# Patient Record
Sex: Female | Born: 1965 | Race: White | Hispanic: No | Smoking: Never smoker
Health system: Southern US, Community
[De-identification: ages and names within clinical notes are randomized; demographics above are authoritative.]

## PROBLEM LIST (undated history)

## (undated) DIAGNOSIS — I639 Cerebral infarction, unspecified: Secondary | ICD-10-CM

## (undated) DIAGNOSIS — J449 Chronic obstructive pulmonary disease, unspecified: Secondary | ICD-10-CM

## (undated) DIAGNOSIS — I2510 Atherosclerotic heart disease of native coronary artery without angina pectoris: Secondary | ICD-10-CM

## (undated) DIAGNOSIS — R519 Headache, unspecified: Secondary | ICD-10-CM

## (undated) DIAGNOSIS — M797 Fibromyalgia: Secondary | ICD-10-CM

## (undated) DIAGNOSIS — N289 Disorder of kidney and ureter, unspecified: Secondary | ICD-10-CM

## (undated) DIAGNOSIS — E119 Type 2 diabetes mellitus without complications: Secondary | ICD-10-CM

## (undated) DIAGNOSIS — T300 Burn of unspecified body region, unspecified degree: Secondary | ICD-10-CM

## (undated) DIAGNOSIS — H5440 Blindness, one eye, unspecified eye: Secondary | ICD-10-CM

## (undated) DIAGNOSIS — R51 Headache: Secondary | ICD-10-CM

## (undated) DIAGNOSIS — E7800 Pure hypercholesterolemia, unspecified: Secondary | ICD-10-CM

## (undated) HISTORY — DX: Pure hypercholesterolemia, unspecified: E78.00

## (undated) HISTORY — PX: WRIST SURGERY: SHX841

## (undated) HISTORY — DX: Fibromyalgia: M79.7

## (undated) HISTORY — PX: FOOT SURGERY: SHX648

## (undated) HISTORY — DX: Cerebral infarction, unspecified: I63.9

## (undated) HISTORY — DX: Headache: R51

## (undated) HISTORY — DX: Headache, unspecified: R51.9

## (undated) HISTORY — PX: KNEE SURGERY: SHX244

---

## 1998-01-24 HISTORY — PX: ABDOMINAL HYSTERECTOMY: SHX81

## 2005-08-15 ENCOUNTER — Emergency Department (HOSPITAL_COMMUNITY): Admission: EM | Admit: 2005-08-15 | Discharge: 2005-08-15 | Payer: Self-pay | Admitting: Emergency Medicine

## 2006-03-24 ENCOUNTER — Encounter: Admission: RE | Admit: 2006-03-24 | Discharge: 2006-03-24 | Payer: Self-pay | Admitting: Orthopedic Surgery

## 2006-03-28 ENCOUNTER — Emergency Department (HOSPITAL_COMMUNITY): Admission: EM | Admit: 2006-03-28 | Discharge: 2006-03-28 | Payer: Self-pay | Admitting: Family Medicine

## 2006-07-05 ENCOUNTER — Emergency Department (HOSPITAL_COMMUNITY): Admission: EM | Admit: 2006-07-05 | Discharge: 2006-07-05 | Payer: Self-pay | Admitting: Emergency Medicine

## 2006-08-27 ENCOUNTER — Emergency Department (HOSPITAL_COMMUNITY): Admission: EM | Admit: 2006-08-27 | Discharge: 2006-08-27 | Payer: Self-pay | Admitting: Emergency Medicine

## 2006-10-08 ENCOUNTER — Encounter: Admission: RE | Admit: 2006-10-08 | Discharge: 2006-10-08 | Payer: Self-pay | Admitting: Orthopedic Surgery

## 2006-11-15 ENCOUNTER — Emergency Department (HOSPITAL_COMMUNITY): Admission: EM | Admit: 2006-11-15 | Discharge: 2006-11-15 | Payer: Self-pay | Admitting: Emergency Medicine

## 2007-04-02 ENCOUNTER — Encounter: Admission: RE | Admit: 2007-04-02 | Discharge: 2007-04-02 | Payer: Self-pay | Admitting: Internal Medicine

## 2007-04-10 ENCOUNTER — Encounter: Admission: RE | Admit: 2007-04-10 | Discharge: 2007-04-10 | Payer: Self-pay | Admitting: Internal Medicine

## 2007-04-16 ENCOUNTER — Emergency Department (HOSPITAL_COMMUNITY): Admission: EM | Admit: 2007-04-16 | Discharge: 2007-04-16 | Payer: Self-pay | Admitting: Emergency Medicine

## 2010-10-18 LAB — POCT I-STAT, CHEM 8
BUN: 12
Chloride: 106
Creatinine, Ser: 1.3 — ABNORMAL HIGH
Hemoglobin: 15.3 — ABNORMAL HIGH
Sodium: 137
TCO2: 26

## 2010-10-18 LAB — POCT CARDIAC MARKERS
CKMB, poc: <1 — ABNORMAL LOW
Myoglobin, poc: 29
Troponin i, poc: <0.05

## 2010-11-11 LAB — LIPASE, BLOOD: Lipase: 34

## 2010-11-11 LAB — POCT CARDIAC MARKERS
Myoglobin, poc: 50.6
Operator id: 189501
Troponin i, poc: <0.05

## 2010-11-11 LAB — COMPREHENSIVE METABOLIC PANEL
AST: 18
Alkaline Phosphatase: 54
Calcium: 8.9
Chloride: 105
Creatinine, Ser: 0.79
Glucose, Bld: 104 — ABNORMAL HIGH
Sodium: 136

## 2010-11-11 LAB — DIFFERENTIAL
Basophils Relative: 1
Eosinophils Absolute: 0.1
Lymphocytes Relative: 26

## 2010-11-11 LAB — CBC
HCT: 42.1
RBC: 4.99
RDW: 13.3
WBC: 9.4

## 2010-11-11 LAB — CK TOTAL AND CKMB (NOT AT ARMC)
CK, MB: 1.6
Total CK: 84

## 2010-11-11 LAB — URINALYSIS, ROUTINE W REFLEX MICROSCOPIC
Ketones, ur: NEGATIVE
Urobilinogen, UA: 0.2

## 2012-01-25 DIAGNOSIS — I639 Cerebral infarction, unspecified: Secondary | ICD-10-CM

## 2012-01-25 HISTORY — DX: Cerebral infarction, unspecified: I63.9

## 2014-01-24 HISTORY — PX: CHOLECYSTECTOMY: SHX55

## 2014-07-06 ENCOUNTER — Emergency Department (HOSPITAL_COMMUNITY): Payer: 59

## 2014-07-06 ENCOUNTER — Encounter (HOSPITAL_COMMUNITY): Payer: Self-pay

## 2014-07-06 ENCOUNTER — Emergency Department (HOSPITAL_COMMUNITY)
Admission: EM | Admit: 2014-07-06 | Discharge: 2014-07-06 | Disposition: A | Discharge status: Home / Self Care | Payer: 59 | Attending: Emergency Medicine | Admitting: Emergency Medicine

## 2014-07-06 DIAGNOSIS — I10 Essential (primary) hypertension: Secondary | ICD-10-CM | POA: Diagnosis not present

## 2014-07-06 DIAGNOSIS — M6281 Muscle weakness (generalized): Secondary | ICD-10-CM | POA: Insufficient documentation

## 2014-07-06 DIAGNOSIS — E860 Dehydration: Secondary | ICD-10-CM

## 2014-07-06 DIAGNOSIS — D849 Immunodeficiency, unspecified: Secondary | ICD-10-CM | POA: Insufficient documentation

## 2014-07-06 DIAGNOSIS — E1165 Type 2 diabetes mellitus with hyperglycemia: Secondary | ICD-10-CM | POA: Insufficient documentation

## 2014-07-06 DIAGNOSIS — R112 Nausea with vomiting, unspecified: Secondary | ICD-10-CM

## 2014-07-06 DIAGNOSIS — R739 Hyperglycemia, unspecified: Secondary | ICD-10-CM

## 2014-07-06 DIAGNOSIS — Z973 Presence of spectacles and contact lenses: Secondary | ICD-10-CM | POA: Diagnosis not present

## 2014-07-06 DIAGNOSIS — R079 Chest pain, unspecified: Secondary | ICD-10-CM | POA: Insufficient documentation

## 2014-07-06 DIAGNOSIS — H5442 Blindness, left eye, normal vision right eye: Secondary | ICD-10-CM | POA: Insufficient documentation

## 2014-07-06 DIAGNOSIS — R1013 Epigastric pain: Secondary | ICD-10-CM

## 2014-07-06 DIAGNOSIS — R42 Dizziness and giddiness: Secondary | ICD-10-CM

## 2014-07-06 DIAGNOSIS — Z87828 Personal history of other (healed) physical injury and trauma: Secondary | ICD-10-CM | POA: Insufficient documentation

## 2014-07-06 DIAGNOSIS — K2970 Gastritis, unspecified, without bleeding: Secondary | ICD-10-CM | POA: Diagnosis not present

## 2014-07-06 DIAGNOSIS — R51 Headache: Secondary | ICD-10-CM | POA: Diagnosis not present

## 2014-07-06 DIAGNOSIS — R519 Headache, unspecified: Secondary | ICD-10-CM

## 2014-07-06 HISTORY — DX: Blindness, one eye, unspecified eye: H54.40

## 2014-07-06 HISTORY — DX: Burn of unspecified body region, unspecified degree: T30.0

## 2014-07-06 HISTORY — DX: Type 2 diabetes mellitus without complications: E11.9

## 2014-07-06 LAB — CBC WITH DIFFERENTIAL/PLATELET
BASOS PCT: 1 % (ref 0–1)
Basophils Absolute: 0.1 10*3/uL (ref 0.0–0.1)
EOS ABS: 0.1 10*3/uL (ref 0.0–0.7)
Eosinophils Relative: 1 % (ref 0–5)
HCT: 46 % (ref 36.0–46.0)
Hemoglobin: 15.2 g/dL — ABNORMAL HIGH (ref 12.0–15.0)
LYMPHS PCT: 23 % (ref 12–46)
Lymphs Abs: 2.5 10*3/uL (ref 0.7–4.0)
MCH: 27.7 pg (ref 26.0–34.0)
MCHC: 33 g/dL (ref 30.0–36.0)
MCV: 83.9 fL (ref 78.0–100.0)
Monocytes Absolute: 0.7 10*3/uL (ref 0.1–1.0)
Monocytes Relative: 7 % (ref 3–12)
NEUTROS ABS: 7.5 10*3/uL (ref 1.7–7.7)
Neutrophils Relative %: 68 % (ref 43–77)
PLATELETS: 261 10*3/uL (ref 150–400)
RBC: 5.48 MIL/uL — ABNORMAL HIGH (ref 3.87–5.11)
RDW: 14.4 % (ref 11.5–15.5)
WBC: 10.8 10*3/uL — AB (ref 4.0–10.5)

## 2014-07-06 LAB — URINE MICROSCOPIC-ADD ON

## 2014-07-06 LAB — COMPREHENSIVE METABOLIC PANEL
ALBUMIN: 3.9 g/dL (ref 3.5–5.0)
ALT: 36 U/L (ref 14–54)
AST: 40 U/L (ref 15–41)
Alkaline Phosphatase: 99 U/L (ref 38–126)
Anion gap: 11 (ref 5–15)
BUN: 12 mg/dL (ref 6–20)
CALCIUM: 9.6 mg/dL (ref 8.9–10.3)
CO2: 26 mmol/L (ref 22–32)
Chloride: 98 mmol/L — ABNORMAL LOW (ref 101–111)
Creatinine, Ser: 0.96 mg/dL (ref 0.44–1.00)
GFR calc non Af Amer: >60 mL/min (ref >60)
Glucose, Bld: 285 mg/dL — ABNORMAL HIGH (ref 65–99)
POTASSIUM: 5.1 mmol/L (ref 3.5–5.1)
Sodium: 135 mmol/L (ref 135–145)
TOTAL PROTEIN: 7.2 g/dL (ref 6.5–8.1)
Total Bilirubin: 1.3 mg/dL — ABNORMAL HIGH (ref 0.3–1.2)

## 2014-07-06 LAB — I-STAT TROPONIN, ED: TROPONIN I, POC: 0.01 ng/mL (ref 0.00–0.08)

## 2014-07-06 LAB — LIPASE, BLOOD: LIPASE: 33 U/L (ref 22–51)

## 2014-07-06 LAB — URINALYSIS, ROUTINE W REFLEX MICROSCOPIC
Bilirubin Urine: NEGATIVE
Hgb urine dipstick: NEGATIVE
Ketones, ur: NEGATIVE mg/dL
NITRITE: NEGATIVE
Protein, ur: NEGATIVE mg/dL
Specific Gravity, Urine: 1.011 (ref 1.005–1.030)
UROBILINOGEN UA: 0.2 mg/dL (ref 0.0–1.0)
pH: 6 (ref 5.0–8.0)

## 2014-07-06 MED ORDER — ONDANSETRON 4 MG PO TBDP
8.0000 mg | ORAL_TABLET | Freq: Once | ORAL | Status: AC
  Administered 2014-07-06: 8 mg via ORAL

## 2014-07-06 MED ORDER — RANITIDINE HCL 150 MG PO TABS: 150.0000 mg | ORAL_TABLET | Freq: Two times a day (BID) | ORAL | Status: DC

## 2014-07-06 MED ORDER — MORPHINE SULFATE 4 MG/ML IJ SOLN
4.0000 mg | Freq: Once | INTRAMUSCULAR | Status: AC
  Administered 2014-07-06: 4 mg via INTRAVENOUS
  Filled 2014-07-06: qty 1

## 2014-07-06 MED ORDER — GI COCKTAIL ~~LOC~~
30.0000 mL | Freq: Once | ORAL | Status: AC
  Administered 2014-07-06: 30 mL via ORAL
  Filled 2014-07-06: qty 30

## 2014-07-06 MED ORDER — PANTOPRAZOLE SODIUM 40 MG IV SOLR
40.0000 mg | Freq: Once | INTRAVENOUS | Status: AC
  Administered 2014-07-06: 40 mg via INTRAVENOUS
  Filled 2014-07-06: qty 40

## 2014-07-06 MED ORDER — DIPHENHYDRAMINE HCL 50 MG/ML IJ SOLN
25.0000 mg | Freq: Once | INTRAMUSCULAR | Status: AC
  Administered 2014-07-06: 15:00:00 via INTRAVENOUS
  Filled 2014-07-06: qty 1

## 2014-07-06 MED ORDER — HYDROCODONE-ACETAMINOPHEN 5-325 MG PO TABS: 1.0000 | ORAL_TABLET | Freq: Four times a day (QID) | ORAL | Status: DC | PRN

## 2014-07-06 MED ORDER — METOCLOPRAMIDE HCL 5 MG/ML IJ SOLN
10.0000 mg | Freq: Once | INTRAMUSCULAR | Status: AC
  Administered 2014-07-06: 10 mg via INTRAVENOUS
  Filled 2014-07-06: qty 2

## 2014-07-06 MED ORDER — ONDANSETRON 4 MG PO TBDP
ORAL_TABLET | ORAL | Status: AC
  Filled 2014-07-06: qty 2

## 2014-07-06 MED ORDER — SODIUM CHLORIDE 0.9 % IV BOLUS (SEPSIS)
1000.0000 mL | Freq: Once | INTRAVENOUS | Status: AC
  Administered 2014-07-06: 1000 mL via INTRAVENOUS

## 2014-07-06 MED ORDER — METOCLOPRAMIDE HCL 10 MG PO TABS: 10.0000 mg | ORAL_TABLET | Freq: Four times a day (QID) | ORAL | Status: DC | PRN

## 2014-07-06 MED ORDER — GI COCKTAIL ~~LOC~~: 30.0000 mL | Freq: Once | ORAL | Status: AC
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
## 2014-07-06 NOTE — Discharge Instructions (Signed)
Your pain is likely related to gastritis or possibly a peptic ulcer. In addition to your usual home medications, take zantac as directed for two weeks. Avoid spicy or fatty/fried foods, avoid acidic foods. Don't lay down flat within 30 minutes of eating a meal. Stay well hydrated. Your headache was likely from dehydration and from throwing up. Use reglan as directed as needed for nausea or headaches. Use norco as directed as needed for pain, but don't drive while taking norco. Follow up with your regular doctor to recheck symptoms in one week. Follow up with gastroenterologist in 1-2 weeks for ongoing management of your abdominal pain. Return to the ER for changes or worsening symptoms.  Abdominal (belly) pain can be caused by many things. Your caregiver performed an examination and possibly ordered blood/urine tests and imaging (CT scan, x-rays, ultrasound). Many cases can be observed and treated at home after initial evaluation in the emergency department. Even though you are being discharged home, abdominal pain can be unpredictable. Therefore, you need a repeated exam if your pain does not resolve, returns, or worsens. Most patients with abdominal pain don't have to be admitted to the hospital or have surgery, but serious problems like appendicitis and gallbladder attacks can start out as nonspecific pain. Many abdominal conditions cannot be diagnosed in one visit, so follow-up evaluations are very important. SEEK IMMEDIATE MEDICAL ATTENTION IF YOU DEVELOP ANY OF THE FOLLOWING SYMPTOMS:  The pain does not go away or becomes severe.   A temperature above 101 develops.   Repeated vomiting occurs (multiple episodes).   The pain becomes localized to portions of the abdomen. The right side could possibly be appendicitis. In an adult, the left lower portion of the abdomen could be colitis or diverticulitis.   Blood is being passed in stools or vomit (bright red or black tarry stools).   Return also if  you develop chest pain, difficulty breathing, dizziness or fainting, or become confused, poorly responsive, or inconsolable (young children).  The constipation stays for more than 4 days.   There is belly (abdominal) or rectal pain.   You do not seem to be getting better.     Gastritis, Adult Gastritis is soreness and puffiness (inflammation) of the lining of the stomach. If you do not get help, gastritis can cause bleeding and sores (ulcers) in the stomach. HOME CARE   Only take medicine as told by your doctor.  If you were given antibiotic medicines, take them as told. Finish the medicines even if you start to feel better.  Drink enough fluids to keep your pee (urine) clear or pale yellow.  Avoid foods and drinks that make your problems worse. Foods you may want to avoid include:  Caffeine or alcohol.  Chocolate.  Mint.  Garlic and onions.  Spicy foods.  Citrus fruits, including oranges, lemons, or limes.  Food containing tomatoes, including sauce, chili, salsa, and pizza.  Fried and fatty foods.  Eat small meals throughout the day instead of large meals. GET HELP RIGHT AWAY IF:   You have black or dark red poop (stools).  You throw up (vomit) blood. It may look like coffee grounds.  You cannot keep fluids down.  Your belly (abdominal) pain gets worse.  You have a fever.  You do not feel better after 1 week.  You have any other questions or concerns. MAKE SURE YOU:   Understand these instructions.  Will watch your condition.  Will get help right away if you are not doing  well or get worse. Document Released: 06/29/2007 Document Revised: 04/04/2011 Document Reviewed: 02/23/2011 [facility] Patient Information 2015 [place], [place]. This information is not intended to replace advice given to you by your health care provider. Make sure you discuss any questions you have with your health care provider.  Migraine Headache A migraine headache is very bad,  throbbing pain on one or both sides of your head. Talk to your doctor about what things may bring on (trigger) your migraine headaches. HOME CARE  Only take medicines as told by your doctor.  Lie down in a dark, quiet room when you have a migraine.  Keep a journal to find out if certain things bring on migraine headaches. For example, write down:  What you eat and drink.  How much sleep you get.  Any change to your diet or medicines.  Lessen how much alcohol you drink.  Quit smoking if you smoke.  Get enough sleep.  Lessen any stress in your life.  Keep lights dim if bright lights bother you or make your migraines worse. GET HELP RIGHT AWAY IF:   Your migraine becomes really bad.  You have a fever.  You have a stiff neck.  You have trouble seeing.  Your muscles are weak, or you lose muscle control.  You lose your balance or have trouble walking.  You feel like you will pass out (faint), or you pass out.  You have really bad symptoms that are different than your first symptoms. MAKE SURE YOU:   Understand these instructions.  Will watch your condition.  Will get help right away if you are not doing well or get worse. Document Released: 10/20/2007 Document Revised: 04/04/2011 Document Reviewed: 09/17/2012 [facility] Patient Information 2015 [place], [place]. This information is not intended to replace advice given to you by your health care provider. Make sure you discuss any questions you have with your health care provider.  Nausea and Vomiting Nausea is a sick feeling that often comes before throwing up (vomiting). Vomiting is a reflex where stomach contents come out of your mouth. Vomiting can cause severe loss of body fluids (dehydration). Children and elderly adults can become dehydrated quickly, especially if they also have diarrhea. Nausea and vomiting are symptoms of a condition or disease. It is important to find the cause of your symptoms. CAUSES   Direct  irritation of the stomach lining. This irritation can result from increased acid production (gastroesophageal reflux disease), infection, food poisoning, taking certain medicines (such as nonsteroidal anti-inflammatory drugs), alcohol use, or tobacco use.  Signals from the brain.These signals could be caused by a headache, heat exposure, an inner ear disturbance, increased pressure in the brain from injury, infection, a tumor, or a concussion, pain, emotional stimulus, or metabolic problems.  An obstruction in the gastrointestinal tract (bowel obstruction).  Illnesses such as diabetes, hepatitis, gallbladder problems, appendicitis, kidney problems, cancer, sepsis, atypical symptoms of a heart attack, or eating disorders.  Medical treatments such as chemotherapy and radiation.  Receiving medicine that makes you sleep (general anesthetic) during surgery. DIAGNOSIS Your caregiver may ask for tests to be done if the problems do not improve after a few days. Tests may also be done if symptoms are severe or if the reason for the nausea and vomiting is not clear. Tests may include:  Urine tests.  Blood tests.  Stool tests.  Cultures (to look for evidence of infection).  X-rays or other imaging studies. Test results can help your caregiver make decisions about treatment or  the need for additional tests. TREATMENT You need to stay well hydrated. Drink frequently but in small amounts.You may wish to drink water, sports drinks, clear broth, or eat frozen ice pops or gelatin dessert to help stay hydrated.When you eat, eating slowly may help prevent nausea.There are also some antinausea medicines that may help prevent nausea. HOME CARE INSTRUCTIONS   Take all medicine as directed by your caregiver.  If you do not have an appetite, do not force yourself to eat. However, you must continue to drink fluids.  If you have an appetite, eat a normal diet unless your caregiver tells you  differently.  Eat a variety of complex carbohydrates (rice, wheat, potatoes, bread), lean meats, yogurt, fruits, and vegetables.  Avoid high-fat foods because they are more difficult to digest.  Drink enough water and fluids to keep your urine clear or pale yellow.  If you are dehydrated, ask your caregiver for specific rehydration instructions. Signs of dehydration may include:  Severe thirst.  Dry lips and mouth.  Dizziness.  Dark urine.  Decreasing urine frequency and amount.  Confusion.  Rapid breathing or pulse. SEEK IMMEDIATE MEDICAL CARE IF:   You have blood or brown flecks (like coffee grounds) in your vomit.  You have black or bloody stools.  You have a severe headache or stiff neck.  You are confused.  You have severe abdominal pain.  You have chest pain or trouble breathing.  You do not urinate at least once every 8 hours.  You develop cold or clammy skin.  You continue to vomit for longer than 24 to 48 hours.  You have a fever. MAKE SURE YOU:   Understand these instructions.  Will watch your condition.  Will get help right away if you are not doing well or get worse. Document Released: 01/10/2005 Document Revised: 04/04/2011 Document Reviewed: 06/09/2010 [facility] Patient Information 2015 [place], [place]. This information is not intended to replace advice given to you by your health care provider. Make sure you discuss any questions you have with your health care provider.  Dehydration, Adult Dehydration means your body does not have as much fluid as it needs. Your kidneys, brain, and heart will not work properly without the right amount of fluids and salt.  HOME CARE  Ask your doctor how to replace body fluid losses (rehydrate).  Drink enough fluids to keep your pee (urine) clear or pale yellow.  Drink small amounts of fluids often if you feel sick to your stomach (nauseous) or throw up (vomit).  Eat like you normally do.  Avoid:  Foods  or drinks high in sugar.  Bubbly (carbonated) drinks.  Juice.  Very hot or cold fluids.  Drinks with caffeine.  Fatty, greasy foods.  Alcohol.  Tobacco.  Eating too much.  Gelatin desserts.  Wash your hands to avoid spreading germs (bacteria, viruses).  Only take medicine as told by your doctor.  Keep all doctor visits as told. GET HELP RIGHT AWAY IF:   You cannot drink something without throwing up.  You get worse even with treatment.  Your vomit has blood in it or looks greenish.  Your poop (stool) has blood in it or looks black and tarry.  You have not peed in 6 to 8 hours.  You pee a small amount of very dark pee.  You have a fever.  You pass out (faint).  You have belly (abdominal) pain that gets worse or stays in one spot (localizes).  You have a rash, stiff  neck, or bad headache.  You get easily annoyed, sleepy, or are hard to wake up.  You feel weak, dizzy, or very thirsty. MAKE SURE YOU:   Understand these instructions.  Will watch your condition.  Will get help right away if you are not doing well or get worse. Document Released: 11/06/2008 Document Revised: 04/04/2011 Document Reviewed: 08/30/2010 [facility] Patient Information 2015 [place], [place]. This information is not intended to replace advice given to you by your health care provider. Make sure you discuss any questions you have with your health care provider.

## 2014-07-06 NOTE — ED Notes (Signed)
Pt reports onset last night vomiting x 8, last vomit PTA.  Onset 3 days constant upper abd pain.  Onset this morning headache.

## 2014-07-06 NOTE — ED Notes (Signed)
PA at bedside.

## 2014-09-05 ENCOUNTER — Encounter (HOSPITAL_COMMUNITY): Payer: Self-pay | Admitting: *Deleted

## 2014-09-05 ENCOUNTER — Emergency Department (HOSPITAL_COMMUNITY)
Admission: EM | Admit: 2014-09-05 | Discharge: 2014-09-05 | Disposition: A | Discharge status: Home / Self Care | Payer: 59 | Attending: Emergency Medicine | Admitting: Emergency Medicine

## 2014-09-05 ENCOUNTER — Emergency Department (HOSPITAL_COMMUNITY): Payer: 59

## 2014-09-05 DIAGNOSIS — Z87828 Personal history of other (healed) physical injury and trauma: Secondary | ICD-10-CM | POA: Insufficient documentation

## 2014-09-05 DIAGNOSIS — Z79899 Other long term (current) drug therapy: Secondary | ICD-10-CM | POA: Diagnosis not present

## 2014-09-05 DIAGNOSIS — G500 Trigeminal neuralgia: Secondary | ICD-10-CM | POA: Diagnosis not present

## 2014-09-05 DIAGNOSIS — H5442 Blindness, left eye, normal vision right eye: Secondary | ICD-10-CM | POA: Diagnosis not present

## 2014-09-05 DIAGNOSIS — R51 Headache: Secondary | ICD-10-CM | POA: Diagnosis present

## 2014-09-05 DIAGNOSIS — E119 Type 2 diabetes mellitus without complications: Secondary | ICD-10-CM | POA: Diagnosis not present

## 2014-09-05 DIAGNOSIS — Z792 Long term (current) use of antibiotics: Secondary | ICD-10-CM | POA: Insufficient documentation

## 2014-09-05 LAB — BASIC METABOLIC PANEL
Anion gap: 11 (ref 5–15)
BUN: 10 mg/dL (ref 6–20)
CO2: 24 mmol/L (ref 22–32)
Calcium: 9.5 mg/dL (ref 8.9–10.3)
Chloride: 104 mmol/L (ref 101–111)
Creatinine, Ser: 0.93 mg/dL (ref 0.44–1.00)
GFR calc Af Amer: >60 mL/min (ref >60)
GFR calc non Af Amer: >60 mL/min (ref >60)
GLUCOSE: 224 mg/dL — AB (ref 65–99)
POTASSIUM: 3.7 mmol/L (ref 3.5–5.1)
SODIUM: 139 mmol/L (ref 135–145)

## 2014-09-05 LAB — CBC WITH DIFFERENTIAL/PLATELET
BASOS ABS: 0.1 10*3/uL (ref 0.0–0.1)
BASOS PCT: 1 % (ref 0–1)
Eosinophils Absolute: 0.1 10*3/uL (ref 0.0–0.7)
Eosinophils Relative: 1 % (ref 0–5)
HEMATOCRIT: 42.2 % (ref 36.0–46.0)
HEMOGLOBIN: 13.7 g/dL (ref 12.0–15.0)
Lymphocytes Relative: 29 % (ref 12–46)
Lymphs Abs: 1.9 10*3/uL (ref 0.7–4.0)
MCH: 27.5 pg (ref 26.0–34.0)
MCHC: 32.5 g/dL (ref 30.0–36.0)
MCV: 84.7 fL (ref 78.0–100.0)
MONO ABS: 0.5 10*3/uL (ref 0.1–1.0)
MONOS PCT: 8 % (ref 3–12)
Neutro Abs: 4 10*3/uL (ref 1.7–7.7)
Neutrophils Relative %: 61 % (ref 43–77)
Platelets: 259 10*3/uL (ref 150–400)
RBC: 4.98 MIL/uL (ref 3.87–5.11)
RDW: 14.8 % (ref 11.5–15.5)
WBC: 6.5 10*3/uL (ref 4.0–10.5)

## 2014-09-05 MED ORDER — PHENYTOIN SODIUM EXTENDED 100 MG PO CAPS
100.0000 mg | ORAL_CAPSULE | Freq: Once | ORAL | Status: AC
  Administered 2014-09-05: 100 mg via ORAL
  Filled 2014-09-05: qty 1

## 2014-09-05 MED ORDER — PHENYTOIN SODIUM EXTENDED 100 MG PO CAPS: 100.0000 mg | ORAL_CAPSULE | Freq: Three times a day (TID) | ORAL | Status: DC

## 2014-09-05 NOTE — ED Notes (Signed)
The pt has a headache rt jaw pain lips trembling and she feels cold then hot for 30 minutes
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
## 2014-09-05 NOTE — Discharge Instructions (Signed)
Trigeminal Neuralgia  Trigeminal neuralgia is a nerve disorder that causes sudden attacks of severe facial pain. It is caused by damage to the trigeminal nerve, a major nerve in the face. It is more common in women and in the elderly, although it can also happen in younger patients. Attacks last from a few seconds to several minutes and can occur from a couple of times per year to several times per day. Trigeminal neuralgia can be a very distressing and disabling condition. Surgery may be needed in very severe cases if medical treatment does not give relief.  HOME CARE INSTRUCTIONS    If your caregiver prescribed medication to help prevent attacks, take as directed.   To help prevent attacks:   Chew on the unaffected side of the mouth.   Avoid touching your face.   Avoid blasts of hot or cold air.   Men may wish to grow a beard to avoid having to shave.  SEEK IMMEDIATE MEDICAL CARE IF:   Pain is unbearable and your medicine does not help.   You develop new, unexplained symptoms (problems).   You have problems that may be related to a medication you are taking.  Document Released: 01/08/2000 Document Revised: 04/04/2011 Document Reviewed: 11/07/2008  [employer] Patient Information 2015 [employer]. This information is not intended to replace advice given to you by your health care provider. Make sure you discuss any questions you have with your health care provider.

## 2014-11-17 ENCOUNTER — Other Ambulatory Visit: Payer: Self-pay | Admitting: Physician Assistant

## 2014-11-17 DIAGNOSIS — Z1231 Encounter for screening mammogram for malignant neoplasm of breast: Secondary | ICD-10-CM

## 2014-12-09 ENCOUNTER — Other Ambulatory Visit: Payer: Self-pay

## 2014-12-09 ENCOUNTER — Ambulatory Visit
Admission: RE | Admit: 2014-12-09 | Discharge: 2014-12-09 | Disposition: A | Discharge status: Home / Self Care | Payer: 59 | Source: Ambulatory Visit | Attending: Physician Assistant | Admitting: Physician Assistant

## 2014-12-09 DIAGNOSIS — Z1231 Encounter for screening mammogram for malignant neoplasm of breast: Secondary | ICD-10-CM

## 2014-12-11 ENCOUNTER — Other Ambulatory Visit: Payer: Self-pay | Admitting: Family Medicine

## 2014-12-11 DIAGNOSIS — R928 Other abnormal and inconclusive findings on diagnostic imaging of breast: Secondary | ICD-10-CM

## 2014-12-17 ENCOUNTER — Encounter (HOSPITAL_BASED_OUTPATIENT_CLINIC_OR_DEPARTMENT_OTHER): Payer: 59 | Attending: Surgery

## 2014-12-23 ENCOUNTER — Ambulatory Visit
Admission: RE | Admit: 2014-12-23 | Discharge: 2014-12-23 | Disposition: A | Discharge status: Home / Self Care | Payer: 59 | Source: Ambulatory Visit | Attending: Family Medicine | Admitting: Family Medicine

## 2014-12-23 DIAGNOSIS — R928 Other abnormal and inconclusive findings on diagnostic imaging of breast: Secondary | ICD-10-CM

## 2015-02-04 ENCOUNTER — Observation Stay (HOSPITAL_COMMUNITY)
Admission: EM | Admit: 2015-02-04 | Discharge: 2015-02-06 | Disposition: A | Discharge status: Home / Self Care | Payer: BLUE CROSS/BLUE SHIELD | Attending: Internal Medicine | Admitting: Internal Medicine

## 2015-02-04 ENCOUNTER — Encounter (HOSPITAL_COMMUNITY): Payer: Self-pay | Admitting: *Deleted

## 2015-02-04 ENCOUNTER — Emergency Department (HOSPITAL_COMMUNITY): Payer: BLUE CROSS/BLUE SHIELD

## 2015-02-04 DIAGNOSIS — IMO0002 Reserved for concepts with insufficient information to code with codable children: Secondary | ICD-10-CM | POA: Insufficient documentation

## 2015-02-04 DIAGNOSIS — Z8249 Family history of ischemic heart disease and other diseases of the circulatory system: Secondary | ICD-10-CM | POA: Insufficient documentation

## 2015-02-04 DIAGNOSIS — E785 Hyperlipidemia, unspecified: Secondary | ICD-10-CM | POA: Diagnosis present

## 2015-02-04 DIAGNOSIS — K219 Gastro-esophageal reflux disease without esophagitis: Secondary | ICD-10-CM | POA: Insufficient documentation

## 2015-02-04 DIAGNOSIS — R079 Chest pain, unspecified: Principal | ICD-10-CM | POA: Insufficient documentation

## 2015-02-04 DIAGNOSIS — R0789 Other chest pain: Secondary | ICD-10-CM | POA: Insufficient documentation

## 2015-02-04 DIAGNOSIS — E1143 Type 2 diabetes mellitus with diabetic autonomic (poly)neuropathy: Secondary | ICD-10-CM | POA: Insufficient documentation

## 2015-02-04 DIAGNOSIS — R112 Nausea with vomiting, unspecified: Secondary | ICD-10-CM

## 2015-02-04 DIAGNOSIS — Z7982 Long term (current) use of aspirin: Secondary | ICD-10-CM | POA: Diagnosis not present

## 2015-02-04 DIAGNOSIS — E1140 Type 2 diabetes mellitus with diabetic neuropathy, unspecified: Secondary | ICD-10-CM | POA: Insufficient documentation

## 2015-02-04 DIAGNOSIS — E119 Type 2 diabetes mellitus without complications: Secondary | ICD-10-CM | POA: Diagnosis not present

## 2015-02-04 DIAGNOSIS — F329 Major depressive disorder, single episode, unspecified: Secondary | ICD-10-CM | POA: Diagnosis not present

## 2015-02-04 DIAGNOSIS — H409 Unspecified glaucoma: Secondary | ICD-10-CM | POA: Diagnosis not present

## 2015-02-04 DIAGNOSIS — I10 Essential (primary) hypertension: Secondary | ICD-10-CM | POA: Insufficient documentation

## 2015-02-04 DIAGNOSIS — F32A Depression, unspecified: Secondary | ICD-10-CM | POA: Insufficient documentation

## 2015-02-04 DIAGNOSIS — H5442 Blindness, left eye, normal vision right eye: Secondary | ICD-10-CM | POA: Insufficient documentation

## 2015-02-04 DIAGNOSIS — R61 Generalized hyperhidrosis: Secondary | ICD-10-CM

## 2015-02-04 DIAGNOSIS — E1165 Type 2 diabetes mellitus with hyperglycemia: Secondary | ICD-10-CM | POA: Insufficient documentation

## 2015-02-04 LAB — CBC
HCT: 45.1 % (ref 36.0–46.0)
Hemoglobin: 14.4 g/dL (ref 12.0–15.0)
MCH: 26.5 pg (ref 26.0–34.0)
MCHC: 31.9 g/dL (ref 30.0–36.0)
MCV: 83.1 fL (ref 78.0–100.0)
Platelets: 227 10*3/uL (ref 150–400)
RBC: 5.43 MIL/uL — AB (ref 3.87–5.11)
RDW: 13.7 % (ref 11.5–15.5)
WBC: 6.9 10*3/uL (ref 4.0–10.5)

## 2015-02-04 LAB — BASIC METABOLIC PANEL
ANION GAP: 11 (ref 5–15)
BUN: 8 mg/dL (ref 6–20)
CHLORIDE: 103 mmol/L (ref 101–111)
CO2: 21 mmol/L — ABNORMAL LOW (ref 22–32)
Calcium: 9.1 mg/dL (ref 8.9–10.3)
Creatinine, Ser: 0.85 mg/dL (ref 0.44–1.00)
GFR calc non Af Amer: >60 mL/min (ref >60)
Glucose, Bld: 367 mg/dL — ABNORMAL HIGH (ref 65–99)
Potassium: 4 mmol/L (ref 3.5–5.1)
Sodium: 135 mmol/L (ref 135–145)

## 2015-02-04 LAB — I-STAT TROPONIN, ED
TROPONIN I, POC: 0.01 ng/mL (ref 0.00–0.08)
TROPONIN I, POC: 0 ng/mL (ref 0.00–0.08)

## 2015-02-04 LAB — HEPATIC FUNCTION PANEL
ALBUMIN: 3.9 g/dL (ref 3.5–5.0)
ALT: 24 U/L (ref 14–54)
AST: 24 U/L (ref 15–41)
Alkaline Phosphatase: 88 U/L (ref 38–126)
BILIRUBIN TOTAL: 0.5 mg/dL (ref 0.3–1.2)
Bilirubin, Direct: 0.2 mg/dL (ref 0.1–0.5)
Indirect Bilirubin: 0.3 mg/dL (ref 0.3–0.9)
TOTAL PROTEIN: 7 g/dL (ref 6.5–8.1)

## 2015-02-04 LAB — LIPASE, BLOOD: LIPASE: 46 U/L (ref 11–51)

## 2015-02-04 LAB — D-DIMER, QUANTITATIVE (NOT AT ARMC)

## 2015-02-04 MED ORDER — ONDANSETRON 4 MG PO TBDP
4.0000 mg | ORAL_TABLET | Freq: Once | ORAL | Status: AC
  Administered 2015-02-04: 4 mg via ORAL
  Filled 2015-02-04: qty 1

## 2015-02-04 MED ORDER — NITROGLYCERIN 0.4 MG SL SUBL
0.4000 mg | SUBLINGUAL_TABLET | SUBLINGUAL | Status: DC | PRN
  Administered 2015-02-04 (×2): 0.4 mg via SUBLINGUAL
  Filled 2015-02-04 (×2): qty 1

## 2015-02-04 MED ORDER — MORPHINE SULFATE (PF) 4 MG/ML IV SOLN
6.0000 mg | Freq: Once | INTRAVENOUS | Status: AC
  Administered 2015-02-04: 6 mg via INTRAVENOUS
  Filled 2015-02-04: qty 2

## 2015-02-04 MED ORDER — LORAZEPAM 2 MG/ML IJ SOLN
0.5000 mg | Freq: Once | INTRAMUSCULAR | Status: AC
  Administered 2015-02-04: 0.5 mg via INTRAVENOUS
  Filled 2015-02-04: qty 1

## 2015-02-04 MED ORDER — ONDANSETRON HCL 4 MG/2ML IJ SOLN
4.0000 mg | Freq: Once | INTRAMUSCULAR | Status: AC
  Administered 2015-02-04: 4 mg via INTRAVENOUS
  Filled 2015-02-04: qty 2

## 2015-02-04 MED ORDER — ASPIRIN 81 MG PO CHEW
324.0000 mg | CHEWABLE_TABLET | Freq: Once | ORAL | Status: AC
  Administered 2015-02-04: 324 mg via ORAL
  Filled 2015-02-04: qty 4

## 2015-02-04 NOTE — ED Notes (Signed)
Iv attempt unsuccessful, another RN to attempt prior to administer NTG.
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
## 2015-02-04 NOTE — ED Notes (Signed)
Attempted to call report, rn unavailable at this time.

## 2015-02-04 NOTE — ED Notes (Signed)
Pt reports dull mid chest pains that started today, has pain across her shoulder blades. Having sob, denies cough.
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
## 2015-02-05 ENCOUNTER — Observation Stay (HOSPITAL_COMMUNITY): Payer: BLUE CROSS/BLUE SHIELD

## 2015-02-05 ENCOUNTER — Encounter (HOSPITAL_COMMUNITY)
Admission: EM | Disposition: A | Discharge status: Home / Self Care | Payer: BLUE CROSS/BLUE SHIELD | Source: Home / Self Care | Attending: Internal Medicine

## 2015-02-05 DIAGNOSIS — I209 Angina pectoris, unspecified: Secondary | ICD-10-CM | POA: Diagnosis not present

## 2015-02-05 DIAGNOSIS — I2510 Atherosclerotic heart disease of native coronary artery without angina pectoris: Secondary | ICD-10-CM

## 2015-02-05 DIAGNOSIS — R079 Chest pain, unspecified: Secondary | ICD-10-CM | POA: Diagnosis not present

## 2015-02-05 DIAGNOSIS — I10 Essential (primary) hypertension: Secondary | ICD-10-CM

## 2015-02-05 DIAGNOSIS — E1143 Type 2 diabetes mellitus with diabetic autonomic (poly)neuropathy: Secondary | ICD-10-CM | POA: Diagnosis not present

## 2015-02-05 DIAGNOSIS — K219 Gastro-esophageal reflux disease without esophagitis: Secondary | ICD-10-CM

## 2015-02-05 DIAGNOSIS — E0840 Diabetes mellitus due to underlying condition with diabetic neuropathy, unspecified: Secondary | ICD-10-CM

## 2015-02-05 DIAGNOSIS — F329 Major depressive disorder, single episode, unspecified: Secondary | ICD-10-CM

## 2015-02-05 DIAGNOSIS — E785 Hyperlipidemia, unspecified: Secondary | ICD-10-CM

## 2015-02-05 HISTORY — PX: CARDIAC CATHETERIZATION: SHX172

## 2015-02-05 LAB — CBC
HCT: 41.4 % (ref 36.0–46.0)
Hemoglobin: 13.5 g/dL (ref 12.0–15.0)
MCH: 26.6 pg (ref 26.0–34.0)
MCHC: 32.6 g/dL (ref 30.0–36.0)
MCV: 81.7 fL (ref 78.0–100.0)
PLATELETS: 242 10*3/uL (ref 150–400)
RBC: 5.07 MIL/uL (ref 3.87–5.11)
RDW: 13.7 % (ref 11.5–15.5)
WBC: 8.1 10*3/uL (ref 4.0–10.5)

## 2015-02-05 LAB — TROPONIN I: Troponin I: <0.03 ng/mL (ref <0.031)

## 2015-02-05 LAB — GLUCOSE, CAPILLARY
GLUCOSE-CAPILLARY: 246 mg/dL — AB (ref 65–99)
GLUCOSE-CAPILLARY: 228 mg/dL — AB (ref 65–99)
Glucose-Capillary: 282 mg/dL — ABNORMAL HIGH (ref 65–99)
Glucose-Capillary: 169 mg/dL — ABNORMAL HIGH (ref 65–99)
Glucose-Capillary: 298 mg/dL — ABNORMAL HIGH (ref 65–99)

## 2015-02-05 LAB — POCT I-STAT TROPONIN I: Troponin i, poc: 0 ng/mL (ref 0.00–0.08)

## 2015-02-05 LAB — CREATININE, SERUM: Creatinine, Ser: 0.86 mg/dL (ref 0.44–1.00)

## 2015-02-05 LAB — TSH: TSH: 1.231 u[IU]/mL (ref 0.350–4.500)

## 2015-02-05 LAB — PROTIME-INR
INR: 1 (ref 0.00–1.49)
PROTHROMBIN TIME: 13.4 s (ref 11.6–15.2)

## 2015-02-05 LAB — D-DIMER, QUANTITATIVE (NOT AT ARMC)

## 2015-02-05 LAB — MRSA PCR SCREENING: MRSA BY PCR: NEGATIVE

## 2015-02-05 SURGERY — LEFT HEART CATH AND CORONARY ANGIOGRAPHY

## 2015-02-05 MED ORDER — PANTOPRAZOLE SODIUM 40 MG PO TBEC
40.0000 mg | DELAYED_RELEASE_TABLET | Freq: Two times a day (BID) | ORAL | Status: DC
  Administered 2015-02-05 – 2015-02-06 (×4): 40 mg via ORAL
  Filled 2015-02-05 (×4): qty 1

## 2015-02-05 MED ORDER — SODIUM CHLORIDE 0.9 % IJ SOLN
3.0000 mL | Freq: Two times a day (BID) | INTRAMUSCULAR | Status: DC
  Administered 2015-02-05: 3 mL via INTRAVENOUS

## 2015-02-05 MED ORDER — VERAPAMIL HCL 2.5 MG/ML IV SOLN
INTRAVENOUS | Status: AC
  Filled 2015-02-05: qty 2

## 2015-02-05 MED ORDER — SODIUM CHLORIDE 0.9 % IJ SOLN: 3.0000 mL | INTRAMUSCULAR | Status: DC | PRN

## 2015-02-05 MED ORDER — INSULIN GLARGINE 100 UNIT/ML ~~LOC~~ SOLN
15.0000 [IU] | Freq: Every day | SUBCUTANEOUS | Status: DC
  Filled 2015-02-05 (×2): qty 0.15

## 2015-02-05 MED ORDER — HEPARIN SODIUM (PORCINE) 1000 UNIT/ML IJ SOLN
INTRAMUSCULAR | Status: DC | PRN
  Administered 2015-02-05: 4000 [IU] via INTRAVENOUS

## 2015-02-05 MED ORDER — SODIUM CHLORIDE 0.9 % IV SOLN: 250.0000 mL | INTRAVENOUS | Status: DC | PRN

## 2015-02-05 MED ORDER — ONDANSETRON HCL 4 MG/2ML IJ SOLN: 4.0000 mg | Freq: Four times a day (QID) | INTRAMUSCULAR | Status: DC | PRN

## 2015-02-05 MED ORDER — VITAMIN D 1000 UNITS PO TABS
5000.0000 [IU] | ORAL_TABLET | Freq: Every day | ORAL | Status: DC
  Administered 2015-02-06: 5000 [IU] via ORAL
  Filled 2015-02-05 (×2): qty 5

## 2015-02-05 MED ORDER — VERAPAMIL HCL 2.5 MG/ML IV SOLN
INTRAVENOUS | Status: DC | PRN
  Administered 2015-02-05: 3 mL via INTRA_ARTERIAL

## 2015-02-05 MED ORDER — ASPIRIN EC 325 MG PO TBEC
325.0000 mg | DELAYED_RELEASE_TABLET | Freq: Every day | ORAL | Status: DC
  Administered 2015-02-05: 325 mg via ORAL
  Filled 2015-02-05: qty 1

## 2015-02-05 MED ORDER — PIOGLITAZONE HCL 45 MG PO TABS
45.0000 mg | ORAL_TABLET | Freq: Every day | ORAL | Status: DC
  Filled 2015-02-05 (×2): qty 1

## 2015-02-05 MED ORDER — LATANOPROST 0.005 % OP SOLN
1.0000 [drp] | Freq: Every day | OPHTHALMIC | Status: DC
  Administered 2015-02-05 (×2): 1 [drp] via OPHTHALMIC
  Filled 2015-02-05: qty 2.5

## 2015-02-05 MED ORDER — ACETAMINOPHEN 325 MG PO TABS: 650.0000 mg | ORAL_TABLET | ORAL | Status: DC | PRN

## 2015-02-05 MED ORDER — INSULIN ASPART 100 UNIT/ML ~~LOC~~ SOLN
0.0000 [IU] | Freq: Three times a day (TID) | SUBCUTANEOUS | Status: DC
  Administered 2015-02-05 – 2015-02-06 (×3): 5 [IU] via SUBCUTANEOUS
  Administered 2015-02-06: 3 [IU] via SUBCUTANEOUS

## 2015-02-05 MED ORDER — ASPIRIN EC 81 MG PO TBEC: 81.0000 mg | DELAYED_RELEASE_TABLET | Freq: Every day | ORAL | Status: DC

## 2015-02-05 MED ORDER — LINAGLIPTIN 5 MG PO TABS: 5.0000 mg | ORAL_TABLET | Freq: Every day | ORAL | Status: DC

## 2015-02-05 MED ORDER — ASPIRIN EC 81 MG PO TBEC
81.0000 mg | DELAYED_RELEASE_TABLET | Freq: Every day | ORAL | Status: DC
  Administered 2015-02-06: 81 mg via ORAL
  Filled 2015-02-05: qty 1

## 2015-02-05 MED ORDER — DULOXETINE HCL 60 MG PO CPEP
60.0000 mg | ORAL_CAPSULE | Freq: Every day | ORAL | Status: DC
  Administered 2015-02-05 – 2015-02-06 (×2): 60 mg via ORAL
  Filled 2015-02-05 (×2): qty 1

## 2015-02-05 MED ORDER — DIPHENHYDRAMINE HCL 25 MG PO CAPS
25.0000 mg | ORAL_CAPSULE | Freq: Three times a day (TID) | ORAL | Status: DC | PRN
  Administered 2015-02-05: 25 mg via ORAL
  Filled 2015-02-05: qty 1

## 2015-02-05 MED ORDER — FOLIC ACID 1 MG PO TABS
1.0000 mg | ORAL_TABLET | Freq: Every day | ORAL | Status: DC
  Administered 2015-02-06: 1 mg via ORAL
  Filled 2015-02-05 (×2): qty 1

## 2015-02-05 MED ORDER — FENTANYL CITRATE (PF) 100 MCG/2ML IJ SOLN
INTRAMUSCULAR | Status: DC | PRN
  Administered 2015-02-05: 25 ug via INTRAVENOUS
  Administered 2015-02-05: 50 ug via INTRAVENOUS

## 2015-02-05 MED ORDER — LORATADINE 10 MG PO TABS
10.0000 mg | ORAL_TABLET | Freq: Every day | ORAL | Status: DC
  Administered 2015-02-05 – 2015-02-06 (×2): 10 mg via ORAL
  Filled 2015-02-05 (×2): qty 1

## 2015-02-05 MED ORDER — HEPARIN SODIUM (PORCINE) 1000 UNIT/ML IJ SOLN
INTRAMUSCULAR | Status: AC
  Filled 2015-02-05: qty 1

## 2015-02-05 MED ORDER — FENTANYL CITRATE (PF) 100 MCG/2ML IJ SOLN
INTRAMUSCULAR | Status: AC
  Filled 2015-02-05: qty 2

## 2015-02-05 MED ORDER — ONDANSETRON HCL 4 MG/2ML IJ SOLN
4.0000 mg | Freq: Four times a day (QID) | INTRAMUSCULAR | Status: DC | PRN
  Administered 2015-02-05 (×2): 4 mg via INTRAVENOUS
  Filled 2015-02-05 (×2): qty 2

## 2015-02-05 MED ORDER — ASPIRIN 81 MG PO CHEW: 81.0000 mg | CHEWABLE_TABLET | ORAL | Status: DC

## 2015-02-05 MED ORDER — SODIUM CHLORIDE 0.9 % IV SOLN
INTRAVENOUS | Status: DC
  Administered 2015-02-05: 12:00:00 via INTRAVENOUS

## 2015-02-05 MED ORDER — GABAPENTIN 600 MG PO TABS
600.0000 mg | ORAL_TABLET | Freq: Two times a day (BID) | ORAL | Status: DC
  Administered 2015-02-05 – 2015-02-06 (×4): 600 mg via ORAL
  Filled 2015-02-05 (×4): qty 1

## 2015-02-05 MED ORDER — SODIUM CHLORIDE 0.9 % IJ SOLN
3.0000 mL | Freq: Two times a day (BID) | INTRAMUSCULAR | Status: DC
Start: 2015-02-05 — End: 2015-02-06
  Administered 2015-02-05 – 2015-02-06 (×2): 3 mL via INTRAVENOUS

## 2015-02-05 MED ORDER — OXYCODONE-ACETAMINOPHEN 5-325 MG PO TABS
1.0000 | ORAL_TABLET | ORAL | Status: DC | PRN
  Administered 2015-02-05: 2 via ORAL
  Filled 2015-02-05: qty 2

## 2015-02-05 MED ORDER — IOHEXOL 350 MG/ML SOLN
INTRAVENOUS | Status: DC | PRN
  Administered 2015-02-05: 55 mL via INTRA_ARTERIAL

## 2015-02-05 MED ORDER — LIDOCAINE HCL (PF) 1 % IJ SOLN
INTRAMUSCULAR | Status: AC
  Filled 2015-02-05: qty 30

## 2015-02-05 MED ORDER — LINAGLIPTIN 5 MG PO TABS
5.0000 mg | ORAL_TABLET | Freq: Every day | ORAL | Status: DC
  Filled 2015-02-05: qty 1

## 2015-02-05 MED ORDER — MIDAZOLAM HCL 2 MG/2ML IJ SOLN
INTRAMUSCULAR | Status: AC
  Filled 2015-02-05: qty 2

## 2015-02-05 MED ORDER — HYDROCODONE-ACETAMINOPHEN 10-325 MG PO TABS: 1.0000 | ORAL_TABLET | Freq: Four times a day (QID) | ORAL | Status: DC | PRN

## 2015-02-05 MED ORDER — ENOXAPARIN SODIUM 40 MG/0.4ML ~~LOC~~ SOLN
40.0000 mg | Freq: Every day | SUBCUTANEOUS | Status: DC
  Administered 2015-02-06: 40 mg via SUBCUTANEOUS
  Filled 2015-02-05: qty 0.4

## 2015-02-05 MED ORDER — MIDAZOLAM HCL 2 MG/2ML IJ SOLN
INTRAMUSCULAR | Status: DC | PRN
  Administered 2015-02-05: 2 mg via INTRAVENOUS
  Administered 2015-02-05: 1 mg via INTRAVENOUS

## 2015-02-05 MED ORDER — HYDROCODONE-ACETAMINOPHEN 10-325 MG PO TABS
1.0000 | ORAL_TABLET | Freq: Three times a day (TID) | ORAL | Status: DC
  Administered 2015-02-05 (×2): 1 via ORAL
  Filled 2015-02-05 (×3): qty 1

## 2015-02-05 MED ORDER — ROSUVASTATIN CALCIUM 10 MG PO TABS
40.0000 mg | ORAL_TABLET | Freq: Every day | ORAL | Status: DC
  Administered 2015-02-05 – 2015-02-06 (×2): 40 mg via ORAL
  Filled 2015-02-05 (×2): qty 4

## 2015-02-05 MED ORDER — SODIUM CHLORIDE 0.9 % WEIGHT BASED INFUSION: 3.0000 mL/kg/h | INTRAVENOUS | Status: AC

## 2015-02-05 MED ORDER — HEPARIN (PORCINE) IN NACL 2-0.9 UNIT/ML-% IJ SOLN
INTRAMUSCULAR | Status: AC
  Filled 2015-02-05: qty 1000

## 2015-02-05 MED ORDER — MORPHINE SULFATE (PF) 2 MG/ML IV SOLN
2.0000 mg | INTRAVENOUS | Status: DC | PRN
  Administered 2015-02-05: 2 mg via INTRAVENOUS
  Filled 2015-02-05: qty 1

## 2015-02-05 SURGICAL SUPPLY — 9 items
CATH IMPULSE 5F ANG/FL3.5 (CATHETERS) ×2 IMPLANT
DEVICE RAD COMP TR BAND LRG (VASCULAR PRODUCTS) ×3 IMPLANT
GLIDESHEATH SLEND A-KIT 6F 22G (SHEATH) ×3 IMPLANT
KIT HEART LEFT (KITS) ×3 IMPLANT
PACK CARDIAC CATHETERIZATION (CUSTOM PROCEDURE TRAY) ×3 IMPLANT
TRANSDUCER W/STOPCOCK (MISCELLANEOUS) ×3 IMPLANT
TUBING CIL FLEX 10 FLL-RA (TUBING) ×3 IMPLANT
WIRE HI TORQ VERSACORE-J 145CM (WIRE) ×2 IMPLANT
WIRE SAFE-T 1.5MM-J .035X260CM (WIRE) ×3 IMPLANT
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
## 2015-02-05 NOTE — Plan of Care (Signed)
Problem: Phase I Progression Outcomes Goal: Hemodynamically stable Outcome: Completed/Met Date Met:  02/05/15 Pt VS are stable.

## 2015-02-05 NOTE — Progress Notes (Signed)
[facility] PROGRESS NOTE  Annette Norman OZH:[id number] DOB: [birth year] DOA: 02/04/2015 [facility]  Assessment/Plan: 1-CP: atypical, but with some typical features. Patient also with positive risk factors  -troponin neg -no changes on EKG or telemetry -left heart cath with preserved EF and non obstructive CAD -will continue risk factors modifications -PPI BID  2-GERD: will use on PPI BID  3-HLD: continue statins  4-uncontrolled diabetes:  -will use SSI and add lantus -follow A1C -continue low carb diet  5-Glaucoma: continue Xalatan  6-neuropathy: continue neurontin  7-depression: continue cymbalta   Code Status: Full Family Communication: no family at bedside  Disposition Plan: s/p heart cath, no CAD appreciated. Most likely home in am   Consultants:  Cardiology   Procedures: 1. LeftRPDA lesion, 30% stenosed. 2. Ramus lesion, 45% stenosed. 3. There is hyperdynamic left ventricular systolic function. 4. Continuous chest discomfort throughout the procedure   Widely patent coronary arteries without angiographically significant stenosis identified.  Hyperdynamic left ventricular function. EF greater than 60% heart cath    Antibiotics:  None   HPI/Subjective: No fever, no palpitations or SOB. Patient appears to be in no distress; but complaints of CP 8/10  Objective: Filed Vitals:   02/05/15 1620 02/05/15 1955  BP:    Pulse: 0   Temp:  98.3 F (36.8 C)  Resp: 0     Intake/Output Summary (Last 24 hours) at 02/05/15 2130 Last data filed at 02/05/15 1700  Gross per 24 hour  Intake    480 ml  Output      0 ml  Net    480 ml   Filed Weights   02/05/15 0029  Weight: 78.518 kg (173 lb 1.6 oz)    Exam:   General:  Sitting up in chair, no acute distress, expressing complaints of CP 8/10  Cardiovascular: S1 and S2, no rubs or gallops  Respiratory: CTA bilaterally  Abdomen: soft, no guarding, positive BS. Patient  complaining of epigastric discomfort  Musculoskeletal: no edema or cyanosis   Data Reviewed: Basic Metabolic Panel:  Recent Labs Lab 02/04/15 1415 02/05/15 0217  NA 135  --   K 4.0  --   CL 103  --   CO2 21*  --   GLUCOSE 367*  --   BUN 8  --   CREATININE 0.85 0.86  CALCIUM 9.1  --    Liver Function Tests:  Recent Labs Lab 02/04/15 1912  AST 24  ALT 24  ALKPHOS 88  BILITOT 0.5  PROT 7.0  ALBUMIN 3.9    Recent Labs Lab 02/04/15 1912  LIPASE 46   CBC:  Recent Labs Lab 02/04/15 1415 02/05/15 0217  WBC 6.9 8.1  HGB 14.4 13.5  HCT 45.1 41.4  MCV 83.1 81.7  PLT 227 242   Cardiac Enzymes:  Recent Labs Lab 02/05/15 0217 02/05/15 0708 02/05/15 1130  TROPONINI <0.03 <0.03 <0.03   CBG:  Recent Labs Lab 02/05/15 0114 02/05/15 0751 02/05/15 1201 02/05/15 1639 02/05/15 1958  GLUCAP 246* 282* 228* 169* 298*    Recent Results (from the past 240 hour(s))  MRSA PCR Screening     Status: None   Collection Time: 02/05/15 12:20 AM  Result Value Ref Range Status   MRSA by PCR NEGATIVE NEGATIVE Final    Comment:        The GeneXpert MRSA Assay (FDA approved for NASAL specimens only), is one component of a comprehensive MRSA colonization surveillance program. It is not intended to diagnose MRSA infection  nor to guide or monitor treatment for MRSA infections.      Studies: Dg Chest 2 View  02/04/2015  CLINICAL DATA:  Chest heaviness beginning this morning. EXAM: CHEST  2 VIEW COMPARISON:  PA and lateral chest 07/06/2014 and 02/10/2014. FINDINGS: The lungs are clear. Heart size is normal. No pneumothorax or pleural effusion. No focal bony abnormality. IMPRESSION: Negative chest. Electronically Signed   By: Drusilla Kanner M.D.   On: 02/04/2015 14:37    Scheduled Meds: . aspirin EC  325 mg Oral Daily  . cholecalciferol  5,000 Units Oral Daily  . DULoxetine  60 mg Oral Daily  . enoxaparin (LOVENOX) injection  40 mg Subcutaneous Daily  . folic  acid  1 mg Oral Daily  . gabapentin  600 mg Oral BID  . HYDROcodone-acetaminophen  1 tablet Oral TID AC & HS  . insulin aspart  0-15 Units Subcutaneous TID WC  . insulin glargine  15 Units Subcutaneous QHS  . latanoprost  1 drop Left Eye QHS  . loratadine  10 mg Oral Daily  . pantoprazole  40 mg Oral BID  . rosuvastatin  40 mg Oral Daily  . sodium chloride  3 mL Intravenous Q12H   Continuous Infusions:   Principal Problem:   Chest pain Active Problems:   Diabetes mellitus type 2, controlled (HCC)   HLD (hyperlipidemia)    Time spent: 30 minutes    Vassie Loll  [facility] Pager [phone]. If 7PM-7AM, please contact night-coverage at [email or web address], password [facility] 02/05/2015, 9:30 PM  LOS: 1 day

## 2015-02-05 NOTE — Progress Notes (Signed)
Pt CBG is 298, pt has refused to take SSI even after being educated about the need to keep glycemia under control. She is also eating a lot of high glucose food provided by family and friend.  Colleen Canesar Dannisha Eckmann, RN

## 2015-02-05 NOTE — Progress Notes (Addendum)
Inpatient Diabetes Program Recommendations  AACE/ADA: New Consensus Statement on Inpatient Glycemic Control (2015)  Target Ranges:  Prepandial:   less than 140 mg/dL      Peak postprandial:   less than 180 mg/dL (1-2 hours)      Critically ill patients:  140 - 180 mg/dL   Results for Dimple CaseyFOUSHEE, Bekka (MRN [id number]) as of 02/05/2015 15:05  Ref. Range 02/05/2015 01:14 02/05/2015 07:51 02/05/2015 12:01  Glucose-Capillary Latest Ref Range: 65-99 mg/dL 409246 (H) 811282 (H) 914228 (H)   Review of Glycemic Control  Diabetes history: DM 2 Outpatient Diabetes medications: Jentadueto 2.05-998 mg BID, Tradjenta 5 mg Daily, Actos 45 mg Daily Current orders for Inpatient glycemic control: Tradjenta 5 mg Daily, Actos 45 mg Daily, Novolog moderate TID  Inpatient Diabetes Program Recommendations: Insulin - Basal: Patient NPO currently not able to take oral hypoglycemics. Glucose in the 200's, patient may need to be placed on low dose basal insulin while inpatient and continue oral meds as an outpatient. Consider Lantus 12 units Q24hrs and discontinuing oral DM meds.  Thanks,  Christena DeemShannon Ernie Sagrero RN, MSN, [facility] Inpatient Diabetes Coordinator Team Pager [phone] (8a-5p)

## 2015-02-06 ENCOUNTER — Observation Stay (HOSPITAL_COMMUNITY): Payer: BLUE CROSS/BLUE SHIELD

## 2015-02-06 ENCOUNTER — Encounter (HOSPITAL_COMMUNITY): Payer: Self-pay | Admitting: Interventional Cardiology

## 2015-02-06 DIAGNOSIS — R079 Chest pain, unspecified: Secondary | ICD-10-CM | POA: Diagnosis not present

## 2015-02-06 DIAGNOSIS — E785 Hyperlipidemia, unspecified: Secondary | ICD-10-CM | POA: Diagnosis not present

## 2015-02-06 DIAGNOSIS — E1143 Type 2 diabetes mellitus with diabetic autonomic (poly)neuropathy: Secondary | ICD-10-CM | POA: Insufficient documentation

## 2015-02-06 DIAGNOSIS — F32A Depression, unspecified: Secondary | ICD-10-CM | POA: Insufficient documentation

## 2015-02-06 DIAGNOSIS — IMO0002 Reserved for concepts with insufficient information to code with codable children: Secondary | ICD-10-CM | POA: Insufficient documentation

## 2015-02-06 DIAGNOSIS — E1165 Type 2 diabetes mellitus with hyperglycemia: Secondary | ICD-10-CM

## 2015-02-06 DIAGNOSIS — K219 Gastro-esophageal reflux disease without esophagitis: Secondary | ICD-10-CM | POA: Insufficient documentation

## 2015-02-06 DIAGNOSIS — F329 Major depressive disorder, single episode, unspecified: Secondary | ICD-10-CM | POA: Insufficient documentation

## 2015-02-06 DIAGNOSIS — I10 Essential (primary) hypertension: Secondary | ICD-10-CM | POA: Insufficient documentation

## 2015-02-06 LAB — GLUCOSE, CAPILLARY
Glucose-Capillary: 185 mg/dL — ABNORMAL HIGH (ref 65–99)
Glucose-Capillary: 225 mg/dL — ABNORMAL HIGH (ref 65–99)

## 2015-02-06 LAB — HEMOGLOBIN A1C
Hgb A1c MFr Bld: 10.1 % — ABNORMAL HIGH (ref 4.8–5.6)
Mean Plasma Glucose: 243 mg/dL

## 2015-02-06 MED ORDER — INSULIN PEN NEEDLE 32G X 6 MM MISC: Status: DC

## 2015-02-06 MED ORDER — OXYCODONE-ACETAMINOPHEN 5-325 MG PO TABS: 1.0000 | ORAL_TABLET | Freq: Four times a day (QID) | ORAL | Status: DC | PRN

## 2015-02-06 MED ORDER — INSULIN DETEMIR 100 UNIT/ML FLEXPEN: 15.0000 [IU] | PEN_INJECTOR | Freq: Every day | SUBCUTANEOUS | Status: DC

## 2015-02-06 MED FILL — Lidocaine HCl Local Preservative Free (PF) Inj 1%: INTRAMUSCULAR | Qty: 30 | Status: AC

## 2015-02-06 MED FILL — Heparin Sodium (Porcine) 2 Unit/ML in Sodium Chloride 0.9%: INTRAMUSCULAR | Qty: 500 | Status: AC

## 2015-02-06 NOTE — Progress Notes (Signed)
Subjective:  No further CP. S/P cath revealing no CAD  Objective:  Temp:  [98.3 F (36.8 C)] 98.3 F (36.8 C) (01/13 0411) Pulse Rate:  [0-115] 84 (01/13 0740) Resp:  [0-20] 15 (01/13 0740) BP: (106-139)/(69-88) 106/77 mmHg (01/13 0740) SpO2:  [0 %-97 %] 96 % (01/13 0740) Weight:  [174 lb 14.4 oz (79.334 kg)] 174 lb 14.4 oz (79.334 kg) (01/13 0411) Weight change: 1 lb 12.8 oz (0.816 kg)  Intake/Output from previous day: 01/12 0701 - 01/13 0700 In: 480 [P.O.:480] Out: 400 [Urine:400]  Intake/Output from this shift: Total I/O In: 240 [P.O.:240] Out: -   Physical Exam: General appearance: alert and no distress Neck: no adenopathy, no carotid bruit, no JVD, supple, symmetrical, trachea midline and thyroid not enlarged, symmetric, no tenderness/mass/nodules Lungs: clear to auscultation bilaterally Heart: regular rate and rhythm, S1, S2 normal, no murmur, click, rub or gallop Extremities: extremities normal, atraumatic, no cyanosis or edema and right radial puncture sute OK  Lab Results: Results for orders placed or performed during the hospital encounter of 02/04/15 (from the past 48 hour(s))  Basic metabolic panel     Status: Abnormal   Collection Time: 02/04/15  2:15 PM  Result Value Ref Range   Sodium 135 135 - 145 mmol/L   Potassium 4.0 3.5 - 5.1 mmol/L   Chloride 103 101 - 111 mmol/L   CO2 21 (L) 22 - 32 mmol/L   Glucose, Bld 367 (H) 65 - 99 mg/dL   BUN 8 6 - 20 mg/dL   Creatinine, Ser 0.85 0.44 - 1.00 mg/dL   Calcium 9.1 8.9 - 10.3 mg/dL   GFR calc non Af Amer >60 >60 mL/min   GFR calc Af Amer >60 >60 mL/min    Comment: (NOTE) The eGFR has been calculated using the CKD EPI equation. This calculation has not been validated in all clinical situations. eGFR's persistently <60 mL/min signify possible Chronic Kidney Disease.    Anion gap 11 5 - 15  CBC     Status: Abnormal   Collection Time: 02/04/15  2:15 PM  Result Value Ref Range   WBC 6.9 4.0 - 10.5  K/uL   RBC 5.43 (H) 3.87 - 5.11 MIL/uL   Hemoglobin 14.4 12.0 - 15.0 g/dL   HCT 45.1 36.0 - 46.0 %   MCV 83.1 78.0 - 100.0 fL   MCH 26.5 26.0 - 34.0 pg   MCHC 31.9 30.0 - 36.0 g/dL   RDW 13.7 11.5 - 15.5 %   Platelets 227 150 - 400 K/uL  I-stat troponin, ED (not at [facility], [facility])     Status: None   Collection Time: 02/04/15  2:31 PM  Result Value Ref Range   Troponin i, poc 0.01 0.00 - 0.08 ng/mL   Comment 3            Comment: Due to the release kinetics of cTnI, a negative result within the first hours of the onset of symptoms does not rule out myocardial infarction with certainty. If myocardial infarction is still suspected, repeat the test at appropriate intervals.   D-dimer, quantitative (not at [facility])     Status: None   Collection Time: 02/04/15  7:11 PM  Result Value Ref Range   D-Dimer, Quant <0.27 0.00 - 0.50 ug/mL-FEU    Comment: (NOTE) At the manufacturer cut-off of 0.50 ug/mL FEU, this assay has been documented to exclude PE with a sensitivity and negative predictive value of 97 to 99%.  At this  time, this assay has not been approved by the FDA to exclude DVT/VTE. Results should be correlated with clinical presentation.   Hepatic function panel     Status: None   Collection Time: 02/04/15  7:12 PM  Result Value Ref Range   Total Protein 7.0 6.5 - 8.1 g/dL   Albumin 3.9 3.5 - 5.0 g/dL   AST 24 15 - 41 U/L   ALT 24 14 - 54 U/L   Alkaline Phosphatase 88 38 - 126 U/L   Total Bilirubin 0.5 0.3 - 1.2 mg/dL   Bilirubin, Direct 0.2 0.1 - 0.5 mg/dL   Indirect Bilirubin 0.3 0.3 - 0.9 mg/dL  Lipase, blood     Status: None   Collection Time: 02/04/15  7:12 PM  Result Value Ref Range   Lipase 46 11 - 51 U/L  I-Stat Troponin, ED - 0, 3, 6 hours (not at [facility])     Status: None   Collection Time: 02/04/15  7:46 PM  Result Value Ref Range   Troponin i, poc 0.00 0.00 - 0.08 ng/mL   Comment 3            Comment: Due to the release kinetics of cTnI, a negative result within the  first hours of the onset of symptoms does not rule out myocardial infarction with certainty. If myocardial infarction is still suspected, repeat the test at appropriate intervals.   MRSA PCR Screening     Status: None   Collection Time: 02/05/15 12:20 AM  Result Value Ref Range   MRSA by PCR NEGATIVE NEGATIVE    Comment:        The GeneXpert MRSA Assay (FDA approved for NASAL specimens only), is one component of a comprehensive MRSA colonization surveillance program. It is not intended to diagnose MRSA infection nor to guide or monitor treatment for MRSA infections.   POCT i-Stat troponin I     Status: None   Collection Time: 02/05/15 12:22 AM  Result Value Ref Range   Troponin i, poc 0.00 0.00 - 0.08 ng/mL   Comment 3            Comment: Due to the release kinetics of cTnI, a negative result within the first hours of the onset of symptoms does not rule out myocardial infarction with certainty. If myocardial infarction is still suspected, repeat the test at appropriate intervals.   Glucose, capillary     Status: Abnormal   Collection Time: 02/05/15  1:14 AM  Result Value Ref Range   Glucose-Capillary 246 (H) 65 - 99 mg/dL  Troponin I (q 6hr x 3)     Status: None   Collection Time: 02/05/15  2:17 AM  Result Value Ref Range   Troponin I <0.03 <0.031 ng/mL    Comment:        NO INDICATION OF MYOCARDIAL INJURY.   CBC     Status: None   Collection Time: 02/05/15  2:17 AM  Result Value Ref Range   WBC 8.1 4.0 - 10.5 K/uL   RBC 5.07 3.87 - 5.11 MIL/uL   Hemoglobin 13.5 12.0 - 15.0 g/dL   HCT 41.4 36.0 - 46.0 %   MCV 81.7 78.0 - 100.0 fL   MCH 26.6 26.0 - 34.0 pg   MCHC 32.6 30.0 - 36.0 g/dL   RDW 13.7 11.5 - 15.5 %   Platelets 242 150 - 400 K/uL  Creatinine, serum     Status: None   Collection Time: 02/05/15  2:17 AM  Result Value Ref Range   Creatinine, Ser 0.86 0.44 - 1.00 mg/dL   GFR calc non Af Amer >60 >60 mL/min   GFR calc Af Amer >60 >60 mL/min     Comment: (NOTE) The eGFR has been calculated using the CKD EPI equation. This calculation has not been validated in all clinical situations. eGFR's persistently <60 mL/min signify possible Chronic Kidney Disease.   Hemoglobin A1c     Status: Abnormal   Collection Time: 02/05/15  2:17 AM  Result Value Ref Range   Hgb A1c MFr Bld 10.1 (H) 4.8 - 5.6 %    Comment: (NOTE)         Pre-diabetes: 5.7 - 6.4         Diabetes: >6.4         Glycemic control for adults with diabetes: <7.0    Mean Plasma Glucose 243 mg/dL    Comment: (NOTE) Performed At: [facility], [place] [id number] Lindon Romp MD OV:[id number]   Troponin I (q 6hr x 3)     Status: None   Collection Time: 02/05/15  7:08 AM  Result Value Ref Range   Troponin I <0.03 <0.031 ng/mL    Comment:        NO INDICATION OF MYOCARDIAL INJURY.   D-dimer, quantitative (not at [facility])     Status: None   Collection Time: 02/05/15  7:08 AM  Result Value Ref Range   D-Dimer, Quant <0.27 0.00 - 0.50 ug/mL-FEU    Comment: (NOTE) At the manufacturer cut-off of 0.50 ug/mL FEU, this assay has been documented to exclude PE with a sensitivity and negative predictive value of 97 to 99%.  At this time, this assay has not been approved by the FDA to exclude DVT/VTE. Results should be correlated with clinical presentation.   Glucose, capillary     Status: Abnormal   Collection Time: 02/05/15  7:51 AM  Result Value Ref Range   Glucose-Capillary 282 (H) 65 - 99 mg/dL  Troponin I (q 6hr x 3)     Status: None   Collection Time: 02/05/15 11:30 AM  Result Value Ref Range   Troponin I <0.03 <0.031 ng/mL    Comment:        NO INDICATION OF MYOCARDIAL INJURY.   Protime-INR     Status: None   Collection Time: 02/05/15 11:40 AM  Result Value Ref Range   Prothrombin Time 13.4 11.6 - 15.2 seconds   INR 1.00 0.00 - 1.49  Glucose, capillary     Status: Abnormal   Collection Time: 02/05/15 12:01 PM  Result Value  Ref Range   Glucose-Capillary 228 (H) 65 - 99 mg/dL  TSH     Status: None   Collection Time: 02/05/15 12:40 PM  Result Value Ref Range   TSH 1.231 0.350 - 4.500 uIU/mL  Glucose, capillary     Status: Abnormal   Collection Time: 02/05/15  4:39 PM  Result Value Ref Range   Glucose-Capillary 169 (H) 65 - 99 mg/dL  Glucose, capillary     Status: Abnormal   Collection Time: 02/05/15  7:58 PM  Result Value Ref Range   Glucose-Capillary 298 (H) 65 - 99 mg/dL  Glucose, capillary     Status: Abnormal   Collection Time: 02/06/15  7:39 AM  Result Value Ref Range   Glucose-Capillary 185 (H) 65 - 99 mg/dL   Comment 1 Notify RN    Comment 2 Document in Chart  Imaging: Imaging results have been reviewed  Assessment/Plan:   1. Principal Problem: 2.   Chest pain 3. Active Problems: 4.   Diabetes mellitus type 2, controlled ([place]) 5.   HLD (hyperlipidemia) 6.   Time Spent Directly with Patient:  10 minutes  Length of Stay:  LOS: 2 days   S/P radial cath. No CAD with nl LV fxn. Non cardiac CP. Exz neg. Exam benign. Labs OK. Will S/O . No further cardiology F/U necessary.  Quay Burow 02/06/2015, 10:47 AM

## 2015-02-06 NOTE — Discharge Summary (Signed)
Physician Discharge Summary  Annette Norman ZOX:[id number] DOB: May 19, 1965 DOA: 02/04/2015  PCP: [facility]  Admit date: 02/04/2015 Discharge date: 02/06/2015  Time spent: 35 minutes  Recommendations for Outpatient Follow-up:  1. Reassess BMET to follow electrolytes and renal function 2. Close follow up to her CBG's and diabetes; patient started on insulin. A1C 10.1  Discharge Diagnoses:  Chest pain Diabetes mellitus type 2, uncontrolled (HCC) HLD (hyperlipidemia) Essential HTN GERD Neuropathy  Depression  Discharge Condition: stable and improved. Will discharge home and patient will follow up with PCP in 1 week.  Diet recommendation: low sodium and low carbohydrates diet   Filed Weights   02/05/15 0029 02/06/15 0411  Weight: 78.518 kg (173 lb 1.6 oz) 79.334 kg (174 lb 14.4 oz)    History of present illness:  50 y.o. 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 Patient started developing chest pain retrosternal since waking up in the morning today. Patient also had pain and sharp shooting radiating to the left neck and left arm. Since pain did not improve patient came to the ER. Denies any associated shortness of breath. Chest x-ray was unremarkable. EKG shows sinus tachycardia. Troponins were negative and at this time patient will be admitted for further management of chest pain. Nitroglycerin did not help patient with chest pain and morphine was ordered. Patient just pain was associated with mild nausea denies any vomiting or abdominal pain.  Hospital Course:  1-CP: atypical, but with some typical features. Patient also with positive risk factors  -troponin neg X 3 -no changes on EKG or telemetry suggesting ischemia -left heart cath with preserved EF and non obstructive CAD -will continue risk factors modifications (especially diabetes control) -PPI BID  2-GERD: will  use on PPI BID -lifestyle modifications discussed with patient   3-HLD: continue statins  4-uncontrolled diabetes:  -will continue use of jentadueto and Actos -will add lantus for better control  -A1C 10.1 -encourage to follow low carb diet  5-Glaucoma: continue Xalatan  6-neuropathy: continue neurontin -TSH WNL  7-depression: continue cymbalta  Procedures: 1. Left RPDA lesion, 30% stenosed. 2. Ramus lesion, 45% stenosed. 3. There is hyperdynamic left ventricular systolic function. 4. Continuous chest discomfort throughout the procedure   Widely patent coronary arteries without angiographically significant stenosis identified.  Hyperdynamic left ventricular function. EF greater than 60% heart cath  Consultations:  Cardiology   Discharge Exam: Filed Vitals:   02/06/15 0740 02/06/15 1130  BP: 106/77   Pulse: 84   Temp:  98.7 F (37.1 C)  Resp: 15    5. General: Sitting up in chair, no acute distress, expressing still some intermittent CP, but improved from admission in intensity. Denies nausea and vomiting  6. Cardiovascular: S1 and S2, no rubs or gallops 7. Respiratory: CTA bilaterally, good air movement  8. Abdomen: soft, no guarding, positive BS. Patient complaining of epigastric discomfort 9. Musculoskeletal: no edema or cyanosis   Discharge Instructions   Discharge Instructions    Diet - low sodium heart healthy    Complete by:  As directed      Discharge instructions    Complete by:  As directed   Follow low sodium diet and low carbohydrates Take medications as prescribed Keep yourself well hydrated Arrange follow up with PCP in 1 week          Current Discharge Medication List    START taking these medications  Details  Insulin Detemir (LEVEMIR) 100 UNIT/ML Pen Inject 15 Units into the skin daily at 10 pm. Qty: 15 mL, Refills: 3    Insulin Pen Needle (CAREFINE PEN NEEDLES) 32G X 6 MM MISC Use daily to inject insulin as instructed Qty:  100 each, Refills: 2    oxyCODONE-acetaminophen (PERCOCET/ROXICET) 5-325 MG tablet Take 1 tablet by mouth every 6 (six) hours as needed for severe pain. Qty: 30 tablet, Refills: 0      CONTINUE these medications which have NOT CHANGED   Details  aspirin EC 81 MG tablet Take 81 mg by mouth daily.    Cholecalciferol (VITAMIN D3) 5000 UNITS CAPS Take 1 capsule by mouth daily.    CRESTOR 40 MG tablet Take 40 mg by mouth daily. Refills: 0    DULoxetine (CYMBALTA) 60 MG capsule Take 60 mg by mouth daily.    folic acid (FOLVITE) 1 MG tablet Take 1 mg by mouth daily. Refills: 1    gabapentin (NEURONTIN) 600 MG tablet Take 600 mg by mouth 2 (two) times daily.    JENTADUETO 2.05-998 MG TABS Take 1 tablet by mouth 2 (two) times daily. Refills: 0    loratadine (CLARITIN) 10 MG tablet Take 10 mg by mouth daily.    pantoprazole (PROTONIX) 40 MG tablet Take 40 mg by mouth 2 (two) times daily. Refills: 1    pioglitazone (ACTOS) 45 MG tablet Take 45 mg by mouth daily. Refills: 4    Travoprost, BAK Free, (TRAVATAN Z) 0.004 % SOLN ophthalmic solution Place 1 drop into the left eye at bedtime.      STOP taking these medications     HYDROcodone-acetaminophen (NORCO) 10-325 MG per tablet      linagliptin (TRADJENTA) 5 MG TABS tablet        Allergies  Allergen Reactions  . Amitriptyline Hives  . Norco [Hydrocodone-Acetaminophen] Itching    Tolerates Percocet  . Zithromax [Azithromycin] Hives   Follow-up Information    [facility]   Contact information:   [address] [place] [id number] [phone]        The results of significant diagnostics from this hospitalization (including imaging, microbiology, ancillary and laboratory) are listed below for reference.    Significant Diagnostic Studies: Dg Chest 2 View  02/04/2015  CLINICAL DATA:  Chest heaviness beginning this morning. EXAM: CHEST  2 VIEW COMPARISON:  PA and lateral chest  07/06/2014 and 02/10/2014. FINDINGS: The lungs are clear. Heart size is normal. No pneumothorax or pleural effusion. No focal bony abnormality. IMPRESSION: Negative chest. Electronically Signed   By: Drusilla Kannerhomas  Dalessio M.D.   On: 02/04/2015 14:37    Microbiology: Recent Results (from the past 240 hour(s))  MRSA PCR Screening     Status: None   Collection Time: 02/05/15 12:20 AM  Result Value Ref Range Status   MRSA by PCR NEGATIVE NEGATIVE Final    Comment:        The GeneXpert MRSA Assay (FDA approved for NASAL specimens only), is one component of a comprehensive MRSA colonization surveillance program. It is not intended to diagnose MRSA infection nor to guide or monitor treatment for MRSA infections.      Labs: Basic Metabolic Panel:  Recent Labs Lab 02/04/15 1415 02/05/15 0217  NA 135  --   K 4.0  --   CL 103  --   CO2 21*  --   GLUCOSE 367*  --   BUN 8  --  CREATININE 0.85 0.86  CALCIUM 9.1  --    Liver Function Tests:  Recent Labs Lab 02/04/15 1912  AST 24  ALT 24  ALKPHOS 88  BILITOT 0.5  PROT 7.0  ALBUMIN 3.9    Recent Labs Lab 02/04/15 1912  LIPASE 46   CBC:  Recent Labs Lab 02/04/15 1415 02/05/15 0217  WBC 6.9 8.1  HGB 14.4 13.5  HCT 45.1 41.4  MCV 83.1 81.7  PLT 227 242   Cardiac Enzymes:  Recent Labs Lab 02/05/15 0217 02/05/15 0708 02/05/15 1130  TROPONINI <0.03 <0.03 <0.03   CBG:  Recent Labs Lab 02/05/15 1201 02/05/15 1639 02/05/15 1958 02/06/15 0739 02/06/15 1126  GLUCAP 228* 169* 298* 185* 225*    Signed:  Vassie Loll MD.  [facility] 02/06/2015, 11:47 AM

## 2015-02-06 NOTE — Progress Notes (Signed)
Pt refused Insulin Lantus and would like to continue with oral antidiabetics medications. Raynelle Bringesar Rishawn Walck,RN

## 2015-02-06 NOTE — Progress Notes (Signed)
Pt educated on administration of insulin. Pt gave herself her afternoon dose of insulin and she demonstrated appropriate technique to administering insulin as she will be discharged on levimir. She was able to verbalize to appropriate locations for administration and she demonstrated this process. Pt felt comfortable with this process

## 2015-02-06 NOTE — Care Management Note (Signed)
Case Management Note  Patient Details  Name: [name] MRN: [id number] Date of Birth: [birth year]  Subjective/Objective:  Pt admitted for chest pain. Plan for d/c today. Previous falls [facility] home [facility]            Action[facility]Plan[facility] No further needs from CM at this time.    Expected Discharge Date:                  Expected Discharge Plan:  Home w Home Health Services  In-House Referral:  NA  Discharge planning Services  CM Consult  Post Acute Care Choice:  NA Choice offered to:  NA  DME Arranged:  3-N-1, Walker rolling DME Agency:  [facility]  HH Arranged:  PT HH Agency:  [facility]  Status of Service:  Completed, signed off  Medicare Important Message Given:    Date Medicare IM Given:    Medicare IM give by:    Date Additional Medicare IM Given:    Additional Medicare Important Message give by:     If discussed at Long Length of Stay Meetings, dates discussed:    Additional Comments:  Gala LewandowskyGraves-Bigelow, Elazar Argabright Kaye, RN 02/06/2015, 2:42 PM

## 2015-02-06 NOTE — Evaluation (Signed)
Physical Therapy Evaluation & discharge Patient Details Name: Annette Norman MRN: [id number] DOB: [birth year] Today's Date: 02/06/2015   History of Present Illness  Annette Norman is a 50 y.o. 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 Patient started developing chest pain retrosternal since waking up in the morning today. Patient also had pain and sharp shooting radiating to the left neck and left arm.   Clinical Impression  Pt relays to PT that she has fallen at least 20 times in the last 6 months. Pt c/o B LE pain from knees to feet. Pt reports hx of neuropathy.  Pt ambulates normally without AD.  Introduced a RW to pt and ambulated 175' with heavy reliance on UE with RW with slow cadence.  Pt felt steadier with RW.  Pt will have A this weekend and encouraged to have them with her with mobility til she gets stronger. Recommend HHPT, RW, and 3-1 BSC.  Pt scheduled to dc home after PT session and will d/c.  If pt stays for some reason, please re-order PT services.    Follow Up Recommendations Home health PT    Equipment Recommendations  Rolling walker with 5" wheels;3in1 (PT)    Recommendations for Other Services       Precautions / Restrictions Precautions Precautions: Fall Precaution Comments: Pt reports at least 20 falls in last 6 months      Mobility  Bed Mobility Overal bed mobility: Modified Independent                Transfers Overall transfer level: Modified independent Equipment used: Rolling walker (2 wheeled)             General transfer comment: very shaky upon standing, but able to steady self at [facility]  Ambulation/Gait Ambulation/Gait assistance: Supervision;Min guard Ambulation Distance (Feet): 175 Feet Assistive device: Rolling walker (2 wheeled) Gait Pattern/deviations: Decreased stride length Gait velocity: decreased Gait velocity interpretation: Below  normal speed for age/gender General Gait Details: Pt with heavy reliance on UE with RW. decreased speed. Pt states she feels steadier. cues to look up and not at feet.  Stairs            Wheelchair Mobility    Modified Rankin (Stroke Patients Only)       Balance Overall balance assessment: Needs assistance Sitting-balance support: Feet supported       Standing balance support: Bilateral upper extremity supported Standing balance-Leahy Scale: Poor Standing balance comment: needs RW for steadying                             Pertinent Vitals/Pain Pain Assessment: 0-10 Pain Score: 10-Worst pain ever Pain Location: knees Pain Descriptors / Indicators: Constant;Tiring;Stabbing Pain Intervention(s): Limited activity within patient's tolerance    Home Living Family/patient expects to be discharged to:: Private residence Living Arrangements: Spouse/significant other Available Help at Discharge: Available PRN/intermittently Type of Home: House Home Access: Stairs to enter Entrance Stairs-Rails: Right;Left;Can reach both Technical brewer of Steps: 4-5 Home Layout: One level Home Equipment: None Additional Comments: blind L eye    Prior Function Level of Independence: Independent         Comments: at least 20 falls in the last 6 months     Hand Dominance   Dominant Hand: Right    Extremity/Trunk Assessment   Upper Extremity Assessment: Generalized weakness  Lower Extremity Assessment: Generalized weakness;LLE deficits/detail;RLE deficits/detail         Communication   Communication: No difficulties  Cognition Arousal/Alertness: Awake/alert Behavior During Therapy: WFL for tasks assessed/performed Overall Cognitive Status: Within Functional Limits for tasks assessed                      General Comments General comments (skin integrity, edema, etc.): blind L eye.    Exercises        Assessment/Plan    PT  Assessment All further PT needs can be met in the next venue of care;Patent does not need any further PT services  PT Diagnosis Difficulty walking   PT Problem List Decreased balance  PT Treatment Interventions     PT Goals (Current goals can be found in the Care Plan section) Acute Rehab PT Goals Patient Stated Goal: to not fall- pt liked the RW PT Goal Formulation: All assessment and education complete, DC therapy    Frequency     Barriers to discharge        Co-evaluation               End of Session Equipment Utilized During Treatment: Gait belt Activity Tolerance: Patient tolerated treatment well Patient left: in chair;with call bell/phone within reach Nurse Communication: Mobility status    Functional Assessment Tool Used: clinical judgement and objective findings Functional Limitation: Mobility: Walking and moving around Mobility: Walking and Moving Around Current Status (D6387): At least 1 percent but less than 20 percent impaired, limited or restricted Mobility: Walking and Moving Around Goal Status [phone]): At least 1 percent but less than 20 percent impaired, limited or restricted Mobility: Walking and Moving Around Discharge Status [phone]): At least 1 percent but less than 20 percent impaired, limited or restricted    Time: 1400-1427 PT Time Calculation (min) (ACUTE ONLY): 27 min   Charges:   PT Evaluation $PT Eval Moderate Complexity: 1 Procedure PT Treatments $Gait Training: 8-22 mins   PT G Codes:   PT G-Codes **NOT FOR INPATIENT CLASS** Functional Assessment Tool Used: clinical judgement and objective findings Functional Limitation: Mobility: Walking and moving around Mobility: Walking and Moving Around Current Status (A4166): At least 1 percent but less than 20 percent impaired, limited or restricted Mobility: Walking and Moving Around Goal Status [phone]): At least 1 percent but less than 20 percent impaired, limited or restricted Mobility: Walking  and Moving Around Discharge Status [phone]): At least 1 percent but less than 20 percent impaired, limited or restricted    [facility] 02/06/2015, 2:46 PM

## 2015-02-06 NOTE — Progress Notes (Signed)
Pt discharged with walker and BSC to private vehicle. We took her to her car. Pt in stable condition, denies any pain.

## 2015-03-16 ENCOUNTER — Other Ambulatory Visit: Payer: Self-pay | Admitting: Orthopaedic Surgery

## 2015-03-16 DIAGNOSIS — M25561 Pain in right knee: Secondary | ICD-10-CM

## 2015-03-25 ENCOUNTER — Ambulatory Visit
Admission: RE | Admit: 2015-03-25 | Discharge: 2015-03-25 | Disposition: A | Discharge status: Home / Self Care | Payer: BLUE CROSS/BLUE SHIELD | Source: Ambulatory Visit | Attending: Orthopaedic Surgery | Admitting: Orthopaedic Surgery

## 2015-03-25 DIAGNOSIS — M25561 Pain in right knee: Secondary | ICD-10-CM

## 2015-04-04 ENCOUNTER — Emergency Department (HOSPITAL_COMMUNITY)
Admission: EM | Admit: 2015-04-04 | Discharge: 2015-04-04 | Disposition: A | Discharge status: Home / Self Care | Payer: BLUE CROSS/BLUE SHIELD | Attending: Emergency Medicine | Admitting: Emergency Medicine

## 2015-04-04 ENCOUNTER — Emergency Department (HOSPITAL_COMMUNITY): Payer: BLUE CROSS/BLUE SHIELD

## 2015-04-04 ENCOUNTER — Encounter (HOSPITAL_COMMUNITY): Payer: Self-pay

## 2015-04-04 DIAGNOSIS — Z7982 Long term (current) use of aspirin: Secondary | ICD-10-CM | POA: Diagnosis not present

## 2015-04-04 DIAGNOSIS — E119 Type 2 diabetes mellitus without complications: Secondary | ICD-10-CM | POA: Diagnosis not present

## 2015-04-04 DIAGNOSIS — H5442 Blindness, left eye, normal vision right eye: Secondary | ICD-10-CM | POA: Insufficient documentation

## 2015-04-04 DIAGNOSIS — R200 Anesthesia of skin: Secondary | ICD-10-CM | POA: Diagnosis not present

## 2015-04-04 DIAGNOSIS — R110 Nausea: Secondary | ICD-10-CM | POA: Insufficient documentation

## 2015-04-04 DIAGNOSIS — R1013 Epigastric pain: Secondary | ICD-10-CM | POA: Insufficient documentation

## 2015-04-04 DIAGNOSIS — Z7984 Long term (current) use of oral hypoglycemic drugs: Secondary | ICD-10-CM | POA: Insufficient documentation

## 2015-04-04 DIAGNOSIS — Z791 Long term (current) use of non-steroidal anti-inflammatories (NSAID): Secondary | ICD-10-CM | POA: Diagnosis not present

## 2015-04-04 DIAGNOSIS — Z9889 Other specified postprocedural states: Secondary | ICD-10-CM | POA: Diagnosis not present

## 2015-04-04 DIAGNOSIS — R42 Dizziness and giddiness: Secondary | ICD-10-CM | POA: Diagnosis present

## 2015-04-04 DIAGNOSIS — M79602 Pain in left arm: Secondary | ICD-10-CM | POA: Diagnosis not present

## 2015-04-04 DIAGNOSIS — Z87828 Personal history of other (healed) physical injury and trauma: Secondary | ICD-10-CM | POA: Diagnosis not present

## 2015-04-04 DIAGNOSIS — R079 Chest pain, unspecified: Secondary | ICD-10-CM | POA: Diagnosis not present

## 2015-04-04 DIAGNOSIS — Z79899 Other long term (current) drug therapy: Secondary | ICD-10-CM | POA: Diagnosis not present

## 2015-04-04 DIAGNOSIS — Z794 Long term (current) use of insulin: Secondary | ICD-10-CM | POA: Insufficient documentation

## 2015-04-04 LAB — BASIC METABOLIC PANEL
ANION GAP: 14 (ref 5–15)
BUN: 14 mg/dL (ref 6–20)
CO2: 21 mmol/L — AB (ref 22–32)
Calcium: 9.9 mg/dL (ref 8.9–10.3)
Chloride: 104 mmol/L (ref 101–111)
Creatinine, Ser: 0.84 mg/dL (ref 0.44–1.00)
GFR calc Af Amer: >60 mL/min (ref >60)
GFR calc non Af Amer: >60 mL/min (ref >60)
GLUCOSE: 116 mg/dL — AB (ref 65–99)
POTASSIUM: 4.1 mmol/L (ref 3.5–5.1)
Sodium: 139 mmol/L (ref 135–145)

## 2015-04-04 LAB — I-STAT TROPONIN, ED
TROPONIN I, POC: 0 ng/mL (ref 0.00–0.08)
Troponin i, poc: 0 ng/mL (ref 0.00–0.08)

## 2015-04-04 LAB — CBC
HEMATOCRIT: 44.6 % (ref 36.0–46.0)
HEMOGLOBIN: 14.3 g/dL (ref 12.0–15.0)
MCH: 26.6 pg (ref 26.0–34.0)
MCHC: 32.1 g/dL (ref 30.0–36.0)
MCV: 82.9 fL (ref 78.0–100.0)
Platelets: 266 10*3/uL (ref 150–400)
RBC: 5.38 MIL/uL — ABNORMAL HIGH (ref 3.87–5.11)
RDW: 13.7 % (ref 11.5–15.5)
WBC: 7.1 10*3/uL (ref 4.0–10.5)

## 2015-04-04 MED ORDER — GI COCKTAIL ~~LOC~~
30.0000 mL | Freq: Once | ORAL | Status: AC
  Administered 2015-04-04: 30 mL via ORAL
  Filled 2015-04-04: qty 30

## 2015-04-04 MED ORDER — KETOROLAC TROMETHAMINE 60 MG/2ML IM SOLN
30.0000 mg | Freq: Once | INTRAMUSCULAR | Status: AC
  Administered 2015-04-04: 30 mg via INTRAMUSCULAR
  Filled 2015-04-04: qty 2
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
## 2015-04-04 NOTE — ED Notes (Signed)
Pt. Had abdominal pain all week and believes it is related to her new Diabetes medication l  This am she developed an episode of sharp lt. Arm pain  That radiated into her lt. hneck and chest .  She also had dizziness and sob.  Skin is warm , pink and dry.   ABdomen is distended and she feels bloated.  She also reports having pain into her lt. Groin  No acute distress noted.  ECG completed in triage
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
## 2015-04-20 ENCOUNTER — Encounter: Payer: Self-pay | Admitting: Diagnostic Neuroimaging

## 2015-04-20 ENCOUNTER — Ambulatory Visit (INDEPENDENT_AMBULATORY_CARE_PROVIDER_SITE_OTHER): Payer: BLUE CROSS/BLUE SHIELD | Admitting: Diagnostic Neuroimaging

## 2015-04-20 VITALS — BP 120/76 | HR 78 | Ht 63.0 in | Wt 178.8 lb

## 2015-04-20 DIAGNOSIS — G43009 Migraine without aura, not intractable, without status migrainosus: Secondary | ICD-10-CM | POA: Diagnosis not present

## 2015-04-20 DIAGNOSIS — M542 Cervicalgia: Secondary | ICD-10-CM

## 2015-04-20 DIAGNOSIS — R42 Dizziness and giddiness: Secondary | ICD-10-CM | POA: Diagnosis not present

## 2015-04-20 MED ORDER — RIZATRIPTAN BENZOATE 10 MG PO TBDP: 10.0000 mg | ORAL_TABLET | ORAL | Status: DC | PRN

## 2015-04-20 MED ORDER — TOPIRAMATE 50 MG PO TABS: 50.0000 mg | ORAL_TABLET | Freq: Two times a day (BID) | ORAL | Status: DC
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
## 2015-04-20 NOTE — Patient Instructions (Signed)
Thank you for coming to see us at [facility]. I hope we have been able to provide you high quality care today.  You may receive a patient satisfaction survey over the next few weeks. We would appreciate your feedback and comments so that we may continue to improve ourselves and the health of our patients.  - start topiramate 50mg at bedtime; after 1 week increase to twice a day; drink plenty of water - rizatriptan 10mg as needed for breakthrough headache; may repeat x 1 after 2 hours; max 2 tabs per day or 8 per month    ~~~~~~~~~~~~~~~~~~~~~~~~~~~~~~~~~~~~~~~~~~~~~~~~~~~~~~~~~~~~~~~~~  DR. Christean Silvestri'S GUIDE TO HAPPY AND HEALTHY LIVING These are some of my general health and wellness recommendations. Some of them may apply to you better than others. Please use common sense as you try these suggestions and feel free to ask me any questions.   ACTIVITY/FITNESS Mental, social, emotional and physical stimulation are very important for brain and body health. Try learning a new activity (arts, music, language, sports, games).  Keep moving your body to the best of your abilities. You can do this at home, inside or outside, the park, community center, gym or anywhere you like. Consider a physical therapist or personal trainer to get started. Consider the app Sworkit. Fitness trackers such as smart-watches, smart-phones or Fitbits can help as well.   NUTRITION Eat more plants: colorful vegetables, nuts, seeds and berries.  Eat less sugar, salt, preservatives and processed foods.  Avoid toxins such as cigarettes and alcohol.  Drink water when you are thirsty. Warm water with a slice of lemon is an excellent morning drink to start the day.  Consider these websites for more information The Nutrition Source (https://www.hsph.[employer].edu/nutritionsource) Precision Nutrition (www.precisionnutrition.com/blog/infographics)   RELAXATION Consider practicing mindfulness meditation  or other relaxation techniques such as deep breathing, prayer, yoga, tai chi, massage. See website mindful.org or the apps Headspace or Calm to help get started.   SLEEP Try to get at least 7-8+ hours sleep per day. Regular exercise and reduced caffeine will help you sleep better. Practice good sleep hygeine techniques. See website sleep.org for more information.   PLANNING Prepare estate planning, living will, healthcare POA documents. Sometimes this is best planned with the help of an attorney. Theconversationproject.org and agingwithdignity.org are excellent resources.  

## 2015-04-29 ENCOUNTER — Ambulatory Visit
Admission: RE | Admit: 2015-04-29 | Discharge: 2015-04-29 | Disposition: A | Discharge status: Home / Self Care | Payer: BLUE CROSS/BLUE SHIELD | Source: Ambulatory Visit | Attending: Diagnostic Neuroimaging | Admitting: Diagnostic Neuroimaging

## 2015-04-29 DIAGNOSIS — G43009 Migraine without aura, not intractable, without status migrainosus: Secondary | ICD-10-CM

## 2015-04-29 DIAGNOSIS — M542 Cervicalgia: Secondary | ICD-10-CM

## 2015-04-29 DIAGNOSIS — R42 Dizziness and giddiness: Secondary | ICD-10-CM

## 2015-06-23 ENCOUNTER — Ambulatory Visit (INDEPENDENT_AMBULATORY_CARE_PROVIDER_SITE_OTHER): Payer: BLUE CROSS/BLUE SHIELD | Admitting: Diagnostic Neuroimaging

## 2015-06-23 ENCOUNTER — Encounter: Payer: Self-pay | Admitting: Diagnostic Neuroimaging

## 2015-06-23 VITALS — BP 106/70 | HR 91 | Ht 63.0 in | Wt 167.4 lb

## 2015-06-23 DIAGNOSIS — G43009 Migraine without aura, not intractable, without status migrainosus: Secondary | ICD-10-CM | POA: Diagnosis not present

## 2015-06-23 DIAGNOSIS — M542 Cervicalgia: Secondary | ICD-10-CM | POA: Diagnosis not present

## 2015-06-23 DIAGNOSIS — M545 Low back pain, unspecified: Secondary | ICD-10-CM

## 2015-06-23 DIAGNOSIS — M25561 Pain in right knee: Secondary | ICD-10-CM | POA: Diagnosis not present

## 2015-06-23 DIAGNOSIS — R42 Dizziness and giddiness: Secondary | ICD-10-CM

## 2015-06-23 DIAGNOSIS — M25562 Pain in left knee: Secondary | ICD-10-CM | POA: Diagnosis not present

## 2015-06-23 MED ORDER — RIZATRIPTAN BENZOATE 10 MG PO TBDP: 10.0000 mg | ORAL_TABLET | ORAL | Status: DC | PRN

## 2015-06-23 MED ORDER — TOPIRAMATE 50 MG PO TABS: 50.0000 mg | ORAL_TABLET | Freq: Two times a day (BID) | ORAL | Status: DC

## 2015-06-23 NOTE — Patient Instructions (Signed)
Thank you for coming to see [place] at [facility]. I hope we have been able to provide you high quality care today.  You may receive a patient satisfaction survey over the next few weeks. We would appreciate your feedback and comments so that we may continue to improve ourselves and the health of our patients.  - increase topiramate to 61m in morning and 1071min evening - use rizatriptan as needed for migraine rescue - try increasing physical activity / physical therapy / water therapy   ~~~~~~~~~~~~~~~~~~~~~~~~~~~~~~~~~~~~~~~~~~~~~~~~~~~~~~~~~~~~~~~~~  DR. Leveta Wahab'S GUIDE TO HAPPY AND HEALTHY LIVING These are some of my general health and wellness recommendations. Some of them may apply to you better than others. Please use common sense as you try these suggestions and feel free to ask me any questions.   ACTIVITY/FITNESS Mental, social, emotional and physical stimulation are very important for brain and body health. Try learning a new activity (arts, music, language, sports, games).  Keep moving your body to the best of your abilities. You can do this at home, inside or outside, the park, community center, gym or anywhere you like. Consider a physical therapist or personal trainer to get started. Consider the app Sworkit. Fitness trackers such as smart-watches, smart-phones or Fitbits can help as well.   NUTRITION Eat more plants: colorful vegetables, nuts, seeds and berries.  Eat less sugar, salt, preservatives and processed foods.  Avoid toxins such as cigarettes and alcohol.  Drink water when you are thirsty. Warm water with a slice of lemon is an excellent morning drink to start the day.  Consider these websites for more information The Nutrition Source (hthttps://www.henry-hernandez.biz/Precision Nutrition (wwWindowBlog.ch  RELAXATION Consider practicing mindfulness meditation or other relaxation techniques such  as deep breathing, prayer, yoga, tai chi, massage. See website mindful.org or the apps Headspace or Calm to help get started.   SLEEP Try to get at least 7-8+ hours sleep per day. Regular exercise and reduced caffeine will help you sleep better. Practice good sleep hygeine techniques. See website sleep.org for more information.   PLANNING Prepare estate planning, living will, healthcare POA documents. Sometimes this is best planned with the help of an attorney. Theconversationproject.org and agingwithdignity.org are excellent resources.
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
## 2015-07-07 ENCOUNTER — Ambulatory Visit: Payer: BLUE CROSS/BLUE SHIELD | Attending: Diagnostic Neuroimaging

## 2015-07-07 DIAGNOSIS — R42 Dizziness and giddiness: Secondary | ICD-10-CM | POA: Diagnosis present

## 2015-07-07 DIAGNOSIS — M545 Low back pain: Secondary | ICD-10-CM | POA: Diagnosis present

## 2015-07-07 DIAGNOSIS — M79604 Pain in right leg: Secondary | ICD-10-CM | POA: Diagnosis present

## 2015-07-07 DIAGNOSIS — M79605 Pain in left leg: Secondary | ICD-10-CM | POA: Insufficient documentation

## 2015-07-07 DIAGNOSIS — R2689 Other abnormalities of gait and mobility: Secondary | ICD-10-CM | POA: Insufficient documentation

## 2015-07-07 DIAGNOSIS — M542 Cervicalgia: Secondary | ICD-10-CM | POA: Insufficient documentation
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
## 2015-07-14 ENCOUNTER — Ambulatory Visit: Payer: BLUE CROSS/BLUE SHIELD | Admitting: Physical Therapy

## 2015-07-14 DIAGNOSIS — M545 Low back pain: Secondary | ICD-10-CM

## 2015-07-14 DIAGNOSIS — R2689 Other abnormalities of gait and mobility: Secondary | ICD-10-CM
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
## 2015-07-14 NOTE — Patient Instructions (Addendum)
Provided pt with Basic Back Packet to perform 2x/day consisting of hamstring stretch supine, runners stretch against wall vs step, single knee to chest, double knee to chest, pelvic tilt, lower trunk rotation, bridging, bridging with hip abd/add.

## 2015-07-16 ENCOUNTER — Ambulatory Visit: Payer: BLUE CROSS/BLUE SHIELD | Admitting: Physical Therapy

## 2015-07-16 DIAGNOSIS — R2689 Other abnormalities of gait and mobility: Secondary | ICD-10-CM

## 2015-07-16 DIAGNOSIS — M545 Low back pain: Secondary | ICD-10-CM | POA: Diagnosis not present

## 2015-07-16 NOTE — Patient Instructions (Signed)
Piriformis Stretch, Supine    Lie supine, one ankle crossed onto opposite knee. Holding bottom leg behind knee, gently pull legs toward chest until stretch is felt in buttock of top leg. Hold 30 seconds. For deeper stretch gently push top knee away from body.   Repeat with opposite leg. Repeat 2 times per session. Do 2 sessions per day.  Copyright  VHI. All rights reserved.   Iliotibial Band Stretch, Side-Lying    Lie on side facing edge of bed, top arm behind. Allow top leg to drape over edge. Hold 30 seconds.  Repeat 2 times per session. Repeat on other leg.  Do 2 sessions per day.  Copyright  VHI. All rights reserved.
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
## 2015-07-23 ENCOUNTER — Encounter: Payer: Self-pay | Admitting: Physical Therapy

## 2015-07-23 ENCOUNTER — Ambulatory Visit: Payer: BLUE CROSS/BLUE SHIELD | Admitting: Physical Therapy

## 2015-07-23 DIAGNOSIS — M545 Low back pain: Secondary | ICD-10-CM | POA: Diagnosis not present

## 2015-07-23 DIAGNOSIS — M79604 Pain in right leg: Secondary | ICD-10-CM

## 2015-07-23 DIAGNOSIS — R2689 Other abnormalities of gait and mobility: Secondary | ICD-10-CM
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
## 2015-07-23 NOTE — Patient Instructions (Addendum)
Piriformis Stretch, Supine    Lie supine, one ankle crossed onto opposite knee. Holding bottom leg behind knee, gently pull legs toward chest until stretch is felt in buttock of top leg. Hold 30 seconds. For deeper stretch gently push top knee away from body.   Repeat with opposite leg. Repeat 2 times per session. Do 2 sessions per day.  Copyright  VHI. All rights reserved.   Iliotibial Band Stretch, Side-Lying    Lie on side facing edge of bed, top arm behind. Allow top leg to drape over edge. Hold 30 seconds.  Repeat 2 times per session. Repeat on other leg.  Do 2 sessions per day.  Copyright  VHI. All rights reserved.     

## 2015-07-29 ENCOUNTER — Ambulatory Visit: Payer: BLUE CROSS/BLUE SHIELD | Admitting: Physical Therapy

## 2015-08-04 ENCOUNTER — Ambulatory Visit: Payer: BLUE CROSS/BLUE SHIELD | Admitting: Physical Therapy

## 2015-08-06 ENCOUNTER — Ambulatory Visit: Payer: BLUE CROSS/BLUE SHIELD | Admitting: Physical Therapy

## 2015-08-11 ENCOUNTER — Ambulatory Visit: Payer: BLUE CROSS/BLUE SHIELD

## 2015-08-13 ENCOUNTER — Ambulatory Visit: Payer: BLUE CROSS/BLUE SHIELD

## 2015-08-14 ENCOUNTER — Ambulatory Visit: Payer: Self-pay | Admitting: Rehabilitative and Restorative Service Providers"

## 2015-08-24 ENCOUNTER — Emergency Department (HOSPITAL_COMMUNITY)
Admission: EM | Admit: 2015-08-24 | Discharge: 2015-08-25 | Disposition: A | Discharge status: Home / Self Care | Payer: Medicare Other | Attending: Emergency Medicine | Admitting: Emergency Medicine

## 2015-08-24 ENCOUNTER — Encounter (HOSPITAL_COMMUNITY): Payer: Self-pay | Admitting: Nurse Practitioner

## 2015-08-24 ENCOUNTER — Emergency Department (HOSPITAL_COMMUNITY): Payer: Medicare Other

## 2015-08-24 DIAGNOSIS — J449 Chronic obstructive pulmonary disease, unspecified: Secondary | ICD-10-CM | POA: Insufficient documentation

## 2015-08-24 DIAGNOSIS — Z8673 Personal history of transient ischemic attack (TIA), and cerebral infarction without residual deficits: Secondary | ICD-10-CM | POA: Diagnosis not present

## 2015-08-24 DIAGNOSIS — J40 Bronchitis, not specified as acute or chronic: Secondary | ICD-10-CM | POA: Insufficient documentation

## 2015-08-24 DIAGNOSIS — Z7982 Long term (current) use of aspirin: Secondary | ICD-10-CM | POA: Insufficient documentation

## 2015-08-24 DIAGNOSIS — I10 Essential (primary) hypertension: Secondary | ICD-10-CM | POA: Insufficient documentation

## 2015-08-24 DIAGNOSIS — Z79899 Other long term (current) drug therapy: Secondary | ICD-10-CM | POA: Diagnosis not present

## 2015-08-24 DIAGNOSIS — Z794 Long term (current) use of insulin: Secondary | ICD-10-CM | POA: Diagnosis not present

## 2015-08-24 DIAGNOSIS — E119 Type 2 diabetes mellitus without complications: Secondary | ICD-10-CM | POA: Insufficient documentation

## 2015-08-24 DIAGNOSIS — R05 Cough: Secondary | ICD-10-CM | POA: Diagnosis present

## 2015-08-24 HISTORY — DX: Chronic obstructive pulmonary disease, unspecified: J44.9

## 2015-08-24 MED ORDER — PREDNISONE 20 MG PO TABS
40.0000 mg | ORAL_TABLET | Freq: Once | ORAL | Status: AC
  Administered 2015-08-25: 40 mg via ORAL
  Filled 2015-08-24: qty 2

## 2015-08-24 MED ORDER — PROMETHAZINE-CODEINE 6.25-10 MG/5ML PO SYRP
5.0000 mL | ORAL_SOLUTION | Freq: Four times a day (QID) | ORAL | Status: DC | PRN
Start: 2015-08-24 — End: 2015-08-25
  Administered 2015-08-25: 5 mL via ORAL
  Filled 2015-08-24: qty 5

## 2015-08-24 MED ORDER — PREDNISONE 10 MG PO TABS: 40.0000 mg | ORAL_TABLET | Freq: Every day | ORAL | 0 refills | Status: DC

## 2015-08-24 MED ORDER — PROMETHAZINE-CODEINE 6.25-10 MG/5ML PO SYRP: 5.0000 mL | ORAL_SOLUTION | Freq: Four times a day (QID) | ORAL | 0 refills | Status: DC | PRN

## 2015-08-24 NOTE — Discharge Instructions (Signed)
Take the prednisone as directed.  As a side affect.  You may see elevated blood sugars temp orally while you're taking his medication, you also have been given a prescription for promethazine with codeine for your cough.  Take this as directed. Your chest x-ray does not show any pneumonia
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
## 2015-08-24 NOTE — ED Triage Notes (Signed)
Pt reports 1 week history of cough, sob, chills, body aches. She is alert and breathing easily.

## 2015-10-22 ENCOUNTER — Emergency Department (HOSPITAL_COMMUNITY)
Admission: EM | Admit: 2015-10-22 | Discharge: 2015-10-22 | Disposition: A | Discharge status: Home / Self Care | Payer: Medicare Other | Attending: Emergency Medicine | Admitting: Emergency Medicine

## 2015-10-22 ENCOUNTER — Encounter (HOSPITAL_COMMUNITY): Payer: Self-pay | Admitting: Emergency Medicine

## 2015-10-22 DIAGNOSIS — Z79899 Other long term (current) drug therapy: Secondary | ICD-10-CM | POA: Insufficient documentation

## 2015-10-22 DIAGNOSIS — L500 Allergic urticaria: Secondary | ICD-10-CM | POA: Diagnosis not present

## 2015-10-22 DIAGNOSIS — T782XXA Anaphylactic shock, unspecified, initial encounter: Secondary | ICD-10-CM

## 2015-10-22 DIAGNOSIS — Z955 Presence of coronary angioplasty implant and graft: Secondary | ICD-10-CM | POA: Diagnosis not present

## 2015-10-22 DIAGNOSIS — Z8673 Personal history of transient ischemic attack (TIA), and cerebral infarction without residual deficits: Secondary | ICD-10-CM | POA: Diagnosis not present

## 2015-10-22 DIAGNOSIS — E1140 Type 2 diabetes mellitus with diabetic neuropathy, unspecified: Secondary | ICD-10-CM | POA: Diagnosis not present

## 2015-10-22 DIAGNOSIS — L509 Urticaria, unspecified: Secondary | ICD-10-CM

## 2015-10-22 DIAGNOSIS — R21 Rash and other nonspecific skin eruption: Secondary | ICD-10-CM

## 2015-10-22 DIAGNOSIS — J449 Chronic obstructive pulmonary disease, unspecified: Secondary | ICD-10-CM | POA: Insufficient documentation

## 2015-10-22 DIAGNOSIS — Z794 Long term (current) use of insulin: Secondary | ICD-10-CM | POA: Diagnosis not present

## 2015-10-22 DIAGNOSIS — Z7982 Long term (current) use of aspirin: Secondary | ICD-10-CM | POA: Insufficient documentation

## 2015-10-22 DIAGNOSIS — I10 Essential (primary) hypertension: Secondary | ICD-10-CM | POA: Insufficient documentation

## 2015-10-22 DIAGNOSIS — T7840XA Allergy, unspecified, initial encounter: Secondary | ICD-10-CM | POA: Diagnosis present

## 2015-10-22 MED ORDER — PREDNISONE 10 MG PO TABS: 40.0000 mg | ORAL_TABLET | Freq: Every day | ORAL | 0 refills | Status: AC

## 2015-10-22 MED ORDER — SODIUM CHLORIDE 0.9 % IV BOLUS (SEPSIS)
1000.0000 mL | Freq: Once | INTRAVENOUS | Status: AC
  Administered 2015-10-22: 1000 mL via INTRAVENOUS

## 2015-10-22 MED ORDER — DIPHENHYDRAMINE HCL 25 MG PO TABS: 50.0000 mg | ORAL_TABLET | Freq: Two times a day (BID) | ORAL | 0 refills | Status: DC

## 2015-10-22 MED ORDER — PREDNISONE 20 MG PO TABS
60.0000 mg | ORAL_TABLET | Freq: Once | ORAL | Status: AC
  Administered 2015-10-22: 60 mg via ORAL
  Filled 2015-10-22: qty 3

## 2015-10-22 MED ORDER — EPINEPHRINE 0.3 MG/0.3ML IJ SOAJ: 0.3000 mg | Freq: Once | INTRAMUSCULAR | 0 refills | Status: AC

## 2015-10-22 MED ORDER — HYDROXYZINE HCL 25 MG PO TABS
25.0000 mg | ORAL_TABLET | Freq: Once | ORAL | Status: AC
  Administered 2015-10-22: 25 mg via ORAL
  Filled 2015-10-22: qty 1

## 2015-10-22 MED ORDER — SODIUM CHLORIDE 0.9 % IV BOLUS (SEPSIS): 1000.0000 mL | Freq: Once | INTRAVENOUS | Status: DC

## 2015-10-22 MED ORDER — FAMOTIDINE IN NACL 20-0.9 MG/50ML-% IV SOLN
20.0000 mg | Freq: Once | INTRAVENOUS | Status: AC
  Administered 2015-10-22: 20 mg via INTRAVENOUS
  Filled 2015-10-22: qty 50

## 2015-10-22 NOTE — ED Triage Notes (Signed)
Pt presents from home with symptoms of allergic reaction from bee stings at approx 1902; EMS adminstered 0.3mg  IM Epi, 50mg  benadryl PO, and 4mg  zofran; pt complaining of "lump in throat"; denies SOB, no hives or rash noted right now; pt normotensive upon arrival to ER; pt A&O
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
## 2015-10-26 ENCOUNTER — Ambulatory Visit (INDEPENDENT_AMBULATORY_CARE_PROVIDER_SITE_OTHER): Payer: Medicare Other | Admitting: Physical Medicine and Rehabilitation

## 2015-10-26 DIAGNOSIS — M545 Low back pain: Secondary | ICD-10-CM

## 2015-11-03 ENCOUNTER — Other Ambulatory Visit: Payer: Self-pay | Admitting: Vascular Surgery

## 2015-11-03 DIAGNOSIS — I83891 Varicose veins of right lower extremities with other complications: Secondary | ICD-10-CM

## 2015-11-12 ENCOUNTER — Ambulatory Visit (INDEPENDENT_AMBULATORY_CARE_PROVIDER_SITE_OTHER): Payer: BLUE CROSS/BLUE SHIELD | Admitting: Orthopaedic Surgery

## 2015-11-17 ENCOUNTER — Encounter: Payer: Self-pay | Admitting: Vascular Surgery

## 2015-11-20 ENCOUNTER — Ambulatory Visit (HOSPITAL_COMMUNITY)
Admission: RE | Admit: 2015-11-20 | Discharge: 2015-11-20 | Disposition: A | Discharge status: Home / Self Care | Payer: Medicare Other | Source: Ambulatory Visit | Attending: Vascular Surgery | Admitting: Vascular Surgery

## 2015-11-20 ENCOUNTER — Encounter: Payer: Self-pay | Admitting: Vascular Surgery

## 2015-11-20 ENCOUNTER — Ambulatory Visit (INDEPENDENT_AMBULATORY_CARE_PROVIDER_SITE_OTHER): Payer: Medicare Other | Admitting: Vascular Surgery

## 2015-11-20 ENCOUNTER — Other Ambulatory Visit (INDEPENDENT_AMBULATORY_CARE_PROVIDER_SITE_OTHER): Payer: Self-pay | Admitting: Orthopaedic Surgery

## 2015-11-20 VITALS — BP 118/80 | HR 74 | Temp 97.7°F | Resp 16 | Ht 66.0 in | Wt 159.5 lb

## 2015-11-20 DIAGNOSIS — I739 Peripheral vascular disease, unspecified: Secondary | ICD-10-CM

## 2015-11-20 DIAGNOSIS — I83891 Varicose veins of right lower extremities with other complications: Secondary | ICD-10-CM
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
## 2015-11-23 ENCOUNTER — Encounter (INDEPENDENT_AMBULATORY_CARE_PROVIDER_SITE_OTHER): Payer: Self-pay | Admitting: Orthopaedic Surgery

## 2015-11-23 ENCOUNTER — Ambulatory Visit (INDEPENDENT_AMBULATORY_CARE_PROVIDER_SITE_OTHER): Payer: Medicare Other | Admitting: Orthopaedic Surgery

## 2015-11-23 DIAGNOSIS — M25551 Pain in right hip: Secondary | ICD-10-CM

## 2015-11-23 DIAGNOSIS — G8929 Other chronic pain: Secondary | ICD-10-CM

## 2015-11-23 DIAGNOSIS — M545 Low back pain: Secondary | ICD-10-CM | POA: Diagnosis not present

## 2015-11-23 MED ORDER — MELOXICAM 15 MG PO TABS: 15.0000 mg | ORAL_TABLET | Freq: Every day | ORAL | 2 refills | Status: DC | PRN
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
## 2015-12-03 NOTE — Addendum Note (Signed)
Addended by: Sharee PimpleMCCHESNEY, Josi Roediger K on: 12/03/2015 09:38 AM   Modules accepted: Orders

## 2015-12-04 ENCOUNTER — Encounter: Payer: Self-pay | Admitting: Vascular Surgery

## 2015-12-05 ENCOUNTER — Ambulatory Visit
Admission: RE | Admit: 2015-12-05 | Discharge: 2015-12-05 | Disposition: A | Discharge status: Home / Self Care | Payer: Medicare Other | Source: Ambulatory Visit | Attending: Orthopaedic Surgery | Admitting: Orthopaedic Surgery

## 2015-12-05 DIAGNOSIS — G8929 Other chronic pain: Secondary | ICD-10-CM

## 2015-12-05 DIAGNOSIS — M25551 Pain in right hip: Secondary | ICD-10-CM

## 2015-12-05 DIAGNOSIS — M545 Low back pain: Principal | ICD-10-CM

## 2015-12-08 ENCOUNTER — Ambulatory Visit (HOSPITAL_COMMUNITY)
Admission: RE | Admit: 2015-12-08 | Discharge: 2015-12-08 | Disposition: A | Discharge status: Home / Self Care | Payer: Medicare Other | Source: Ambulatory Visit | Attending: Vascular Surgery | Admitting: Vascular Surgery

## 2015-12-08 DIAGNOSIS — I739 Peripheral vascular disease, unspecified: Secondary | ICD-10-CM | POA: Diagnosis not present

## 2015-12-08 DIAGNOSIS — E119 Type 2 diabetes mellitus without complications: Secondary | ICD-10-CM | POA: Insufficient documentation

## 2015-12-10 ENCOUNTER — Encounter (INDEPENDENT_AMBULATORY_CARE_PROVIDER_SITE_OTHER): Payer: Self-pay | Admitting: Orthopaedic Surgery

## 2015-12-10 ENCOUNTER — Ambulatory Visit (INDEPENDENT_AMBULATORY_CARE_PROVIDER_SITE_OTHER): Payer: Medicare Other | Admitting: Orthopaedic Surgery

## 2015-12-10 DIAGNOSIS — G8929 Other chronic pain: Secondary | ICD-10-CM | POA: Diagnosis not present

## 2015-12-10 DIAGNOSIS — M545 Low back pain, unspecified: Secondary | ICD-10-CM
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
## 2015-12-11 ENCOUNTER — Ambulatory Visit (INDEPENDENT_AMBULATORY_CARE_PROVIDER_SITE_OTHER): Payer: Medicare Other | Admitting: Vascular Surgery

## 2015-12-11 ENCOUNTER — Encounter: Payer: Self-pay | Admitting: Vascular Surgery

## 2015-12-11 VITALS — BP 113/76 | HR 88 | Temp 98.4°F | Resp 16 | Ht 66.0 in | Wt 164.0 lb

## 2015-12-11 DIAGNOSIS — I83019 Varicose veins of right lower extremity with ulcer of unspecified site: Secondary | ICD-10-CM

## 2015-12-11 DIAGNOSIS — L97919 Non-pressure chronic ulcer of unspecified part of right lower leg with unspecified severity: Principal | ICD-10-CM
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
## 2015-12-25 ENCOUNTER — Encounter: Payer: Self-pay | Admitting: Diagnostic Neuroimaging

## 2015-12-25 ENCOUNTER — Ambulatory Visit (INDEPENDENT_AMBULATORY_CARE_PROVIDER_SITE_OTHER): Payer: Medicare HMO | Admitting: Diagnostic Neuroimaging

## 2015-12-25 VITALS — BP 108/75 | HR 79 | Wt 162.8 lb

## 2015-12-25 DIAGNOSIS — F411 Generalized anxiety disorder: Secondary | ICD-10-CM | POA: Diagnosis not present

## 2015-12-25 DIAGNOSIS — G8929 Other chronic pain: Secondary | ICD-10-CM

## 2015-12-25 DIAGNOSIS — M545 Low back pain: Secondary | ICD-10-CM

## 2015-12-25 DIAGNOSIS — F99 Mental disorder, not otherwise specified: Secondary | ICD-10-CM

## 2015-12-25 DIAGNOSIS — M542 Cervicalgia: Secondary | ICD-10-CM | POA: Diagnosis not present

## 2015-12-25 DIAGNOSIS — G43009 Migraine without aura, not intractable, without status migrainosus: Secondary | ICD-10-CM

## 2015-12-25 DIAGNOSIS — F5105 Insomnia due to other mental disorder: Secondary | ICD-10-CM | POA: Diagnosis not present

## 2015-12-25 MED ORDER — RIZATRIPTAN BENZOATE 10 MG PO TBDP: 10.0000 mg | ORAL_TABLET | ORAL | 11 refills | Status: DC | PRN

## 2015-12-25 MED ORDER — TOPIRAMATE 50 MG PO TABS: 50.0000 mg | ORAL_TABLET | Freq: Two times a day (BID) | ORAL | 12 refills | Status: DC
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
## 2015-12-30 ENCOUNTER — Telehealth: Payer: Self-pay | Admitting: *Deleted

## 2015-12-30 NOTE — Telephone Encounter (Signed)
Called [employer] to begin PA for topiramate. Spoke with [place] who stated only 90 tabs/1 month allowed of 50 mg and 100 mg tabs.  Plan allows 60 tabs/1 month of 200 mg tabs.  Called patient to discuss. She was at hospital with her daughter. This RN stated she would call her back tomorrow. Patient verbalized agreement.

## 2015-12-31 NOTE — Telephone Encounter (Signed)
Spoke with patient who stated [employer] is no longer her insurance. She has [employer] and [facility]. She stated [facility] is going [facility], and she [facility] She requested this RN delete [facility] and [facility] from her EMR choices. This RN updated her preferred pharmacy list as requested. Patient stated she picked up a full prescription of topamax yesterday. Patient stated to [facility]. Patient verbalized understanding of call.

## 2016-01-01 NOTE — Therapy (Signed)
[facility] [address] [place], [place], [address] Phone: [phone]   Fax:  [phone]  Patient Details  Name: Annette Norman MRN: [id number] Date of Birth: 09/01/1965 Referring Provider:  No ref. provider found  Encounter Date: 01/01/2016  PHYSICAL THERAPY DISCHARGE SUMMARY  Visits from Start of Care: 4  Current functional level related to goals / functional outcomes:     PT Short Term Goals - 07/16/15 1314      PT SHORT TERM GOAL #1   Title Pt will report LBP and BLE pain is </=8/10 at all times to improve ability to perform ADLs. Target date: 08/04/15   Status On-going     PT SHORT TERM GOAL #2   Title Perform modified ODI and write goal. Target date: 08/04/15   Status Achieved     PT SHORT TERM GOAL #3   Title Perform FGA and write goals. Target date: 08/04/15   Status Achieved     PT SHORT TERM GOAL #4   Title Pt will be IND in HEP to improve strength, balance, and decr. pain. Target date: 08/04/15   Status On-going     PT SHORT TERM GOAL #5   Title Pt will traverse 8 steps with one handrail and report B LE pain does not incr. >/=1 point in order to improve functional mobility. Target date: 08/04/15   Status On-going     Additional Short Term Goals   Additional Short Term Goals Yes     PT SHORT TERM GOAL #6   Title Pt will improve FGA score to >/=22/30 to decr. falls risk. Target date: 08/04/15   Status New          PT Long Term Goals - 07/16/15 1316      PT LONG TERM GOAL #1   Title Pt will amb. 500' with LRAD, at MOD I level, with pt reporting LBP/BLE pain not incr. >/=2 points in order to improve functional mobility. Target date: 09/01/15   Status On-going     PT LONG TERM GOAL #2   Title Pt will report LBP and BLE </=6/10 during ADLs in order to improve IND and quality of life. Target date: 09/01/15   Status On-going     PT LONG TERM GOAL #3   Title Pt will improve gait speed without AD to  >/=3.62f/sec to amb. safely in the community. Target date: 09/01/15   Status On-going     PT LONG TERM GOAL #4   Title Assess cervical spine and dizziness and write goals as needed. Target date: 09/01/15   Status New     PT LONG TERM GOAL #5   Title Pt will improve FGA score to >/=25/30 to decr. falls risk. Target date: 09/01/15   Status New     Additional Long Term Goals   Additional Long Term Goals Yes     PT LONG TERM GOAL #6   Title Pt will improve ODI score to </=26% in order to improve quality of life. Target date: 09/01/15   Status New        Remaining deficits: Unknown, as pt did not return after 4th visit.   Education / Equipment: HEP  Plan: Patient agrees to discharge.  Patient goals were not met. Patient is being discharged due to not returning since the last visit.  ?????       Zacharie Portner L 01/01/2016, 10:13 AM  [facility]  [address] Phone: [phone]   Fax:  [phone]   Geoffry Paradise, PT,DPT 01/01/16 10:13 AM Phone: [phone] Fax: [phone]

## 2016-01-26 ENCOUNTER — Telehealth (INDEPENDENT_AMBULATORY_CARE_PROVIDER_SITE_OTHER): Payer: Self-pay | Admitting: Orthopaedic Surgery

## 2016-01-26 ENCOUNTER — Encounter: Payer: BLUE CROSS/BLUE SHIELD | Admitting: Vascular Surgery

## 2016-01-26 ENCOUNTER — Encounter (HOSPITAL_COMMUNITY): Payer: BLUE CROSS/BLUE SHIELD

## 2016-01-26 MED ORDER — MELOXICAM 15 MG PO TABS: 15.0000 mg | ORAL_TABLET | Freq: Every day | ORAL | 3 refills | Status: DC | PRN

## 2016-01-26 NOTE — Addendum Note (Signed)
Addended by: Albertina ParrGARCIA, Cyan Clippinger on: 01/26/2016 04:59 PM   Modules accepted: Orders

## 2016-01-26 NOTE — Telephone Encounter (Signed)
Please advise 

## 2016-01-26 NOTE — Telephone Encounter (Signed)
Pt needs refill of meloxitam, pt is completely out.  [facility] [place]  She said phone number is [phone]  fax [phone]

## 2016-01-26 NOTE — Telephone Encounter (Signed)
Rx Sent to pharm pt aware

## 2016-01-26 NOTE — Telephone Encounter (Signed)
[facility] Called patient, patient aware.

## 2016-01-26 NOTE — Telephone Encounter (Signed)
Yes #30, 3 refills

## 2016-02-12 ENCOUNTER — Ambulatory Visit (INDEPENDENT_AMBULATORY_CARE_PROVIDER_SITE_OTHER): Payer: Self-pay

## 2016-02-12 ENCOUNTER — Ambulatory Visit (INDEPENDENT_AMBULATORY_CARE_PROVIDER_SITE_OTHER): Payer: Medicare HMO | Admitting: Orthopaedic Surgery

## 2016-02-12 DIAGNOSIS — M25552 Pain in left hip: Secondary | ICD-10-CM | POA: Diagnosis not present

## 2016-02-12 MED ORDER — METHYLPREDNISOLONE ACETATE 40 MG/ML IJ SUSP
40.0000 mg | INTRAMUSCULAR | Status: AC | PRN
  Administered 2016-02-12: 40 mg via INTRA_ARTICULAR

## 2016-02-12 MED ORDER — LIDOCAINE HCL 1 % IJ SOLN
3.0000 mL | INTRAMUSCULAR | Status: AC | PRN
  Administered 2016-02-12: 3 mL

## 2016-02-12 MED ORDER — BUPIVACAINE HCL 0.5 % IJ SOLN
3.0000 mL | INTRAMUSCULAR | Status: AC | PRN
  Administered 2016-02-12: 3 mL via INTRA_ARTICULAR
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
## 2016-02-27 ENCOUNTER — Emergency Department (HOSPITAL_COMMUNITY)
Admission: EM | Admit: 2016-02-27 | Discharge: 2016-02-27 | Disposition: A | Discharge status: Home / Self Care | Payer: Medicare HMO | Attending: Emergency Medicine | Admitting: Emergency Medicine

## 2016-02-27 ENCOUNTER — Emergency Department (HOSPITAL_COMMUNITY): Payer: Medicare HMO

## 2016-02-27 ENCOUNTER — Encounter (HOSPITAL_COMMUNITY): Payer: Self-pay | Admitting: Emergency Medicine

## 2016-02-27 DIAGNOSIS — Z7982 Long term (current) use of aspirin: Secondary | ICD-10-CM | POA: Diagnosis not present

## 2016-02-27 DIAGNOSIS — Z794 Long term (current) use of insulin: Secondary | ICD-10-CM | POA: Diagnosis not present

## 2016-02-27 DIAGNOSIS — E1143 Type 2 diabetes mellitus with diabetic autonomic (poly)neuropathy: Secondary | ICD-10-CM | POA: Diagnosis not present

## 2016-02-27 DIAGNOSIS — R079 Chest pain, unspecified: Secondary | ICD-10-CM | POA: Insufficient documentation

## 2016-02-27 DIAGNOSIS — J449 Chronic obstructive pulmonary disease, unspecified: Secondary | ICD-10-CM | POA: Insufficient documentation

## 2016-02-27 DIAGNOSIS — I10 Essential (primary) hypertension: Secondary | ICD-10-CM | POA: Insufficient documentation

## 2016-02-27 DIAGNOSIS — Z8673 Personal history of transient ischemic attack (TIA), and cerebral infarction without residual deficits: Secondary | ICD-10-CM | POA: Diagnosis not present

## 2016-02-27 LAB — CBC
HCT: 43.2 % (ref 36.0–46.0)
Hemoglobin: 13.6 g/dL (ref 12.0–15.0)
MCH: 26.2 pg (ref 26.0–34.0)
MCHC: 31.5 g/dL (ref 30.0–36.0)
MCV: 83.1 fL (ref 78.0–100.0)
PLATELETS: 251 10*3/uL (ref 150–400)
RBC: 5.2 MIL/uL — AB (ref 3.87–5.11)
RDW: 13.4 % (ref 11.5–15.5)
WBC: 7.8 10*3/uL (ref 4.0–10.5)

## 2016-02-27 LAB — BASIC METABOLIC PANEL
Anion gap: 11 (ref 5–15)
BUN: 14 mg/dL (ref 6–20)
CALCIUM: 9.7 mg/dL (ref 8.9–10.3)
CO2: 19 mmol/L — ABNORMAL LOW (ref 22–32)
CREATININE: 0.81 mg/dL (ref 0.44–1.00)
Chloride: 110 mmol/L (ref 101–111)
GFR calc Af Amer: >60 mL/min (ref >60)
Glucose, Bld: 179 mg/dL — ABNORMAL HIGH (ref 65–99)
Potassium: 3.7 mmol/L (ref 3.5–5.1)
SODIUM: 140 mmol/L (ref 135–145)

## 2016-02-27 LAB — I-STAT TROPONIN, ED: TROPONIN I, POC: 0 ng/mL (ref 0.00–0.08)

## 2016-02-27 MED ORDER — FAMOTIDINE 20 MG PO TABS
20.0000 mg | ORAL_TABLET | Freq: Once | ORAL | Status: AC
  Administered 2016-02-27: 20 mg via ORAL
  Filled 2016-02-27: qty 1

## 2016-02-27 MED ORDER — TRAMADOL HCL 50 MG PO TABS
50.0000 mg | ORAL_TABLET | Freq: Once | ORAL | Status: AC
  Administered 2016-02-27: 50 mg via ORAL
  Filled 2016-02-27: qty 1

## 2016-02-27 NOTE — ED Notes (Signed)
ED Provider at bedside. 

## 2016-02-27 NOTE — ED Triage Notes (Signed)
Pt. Stated, I started having chest pain this morning and some SOB. Pt stated I keep smelling some type of furnance smell. Fire department came and said everything was ok. I have a headache and a little nauseated.

## 2016-02-27 NOTE — Discharge Instructions (Signed)
Ms. Annette Norman,  Please follow up with your PCP for your chest pain
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
## 2016-03-07 ENCOUNTER — Encounter: Payer: Self-pay | Admitting: Vascular Surgery

## 2016-03-15 ENCOUNTER — Ambulatory Visit (INDEPENDENT_AMBULATORY_CARE_PROVIDER_SITE_OTHER): Payer: Medicare HMO | Admitting: Vascular Surgery

## 2016-03-15 ENCOUNTER — Encounter: Payer: Self-pay | Admitting: Vascular Surgery

## 2016-03-15 VITALS — BP 119/75 | HR 87 | Temp 97.2°F | Resp 16 | Ht 63.0 in | Wt 160.0 lb

## 2016-03-15 DIAGNOSIS — M79604 Pain in right leg: Secondary | ICD-10-CM | POA: Insufficient documentation
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
## 2016-05-03 ENCOUNTER — Encounter (HOSPITAL_BASED_OUTPATIENT_CLINIC_OR_DEPARTMENT_OTHER): Payer: Self-pay | Admitting: *Deleted

## 2016-05-03 ENCOUNTER — Emergency Department (HOSPITAL_BASED_OUTPATIENT_CLINIC_OR_DEPARTMENT_OTHER)
Admission: EM | Admit: 2016-05-03 | Discharge: 2016-05-03 | Disposition: A | Discharge status: Home / Self Care | Payer: Medicare HMO | Attending: Emergency Medicine | Admitting: Emergency Medicine

## 2016-05-03 DIAGNOSIS — Z79899 Other long term (current) drug therapy: Secondary | ICD-10-CM | POA: Insufficient documentation

## 2016-05-03 DIAGNOSIS — Z794 Long term (current) use of insulin: Secondary | ICD-10-CM | POA: Insufficient documentation

## 2016-05-03 DIAGNOSIS — J3489 Other specified disorders of nose and nasal sinuses: Secondary | ICD-10-CM | POA: Insufficient documentation

## 2016-05-03 DIAGNOSIS — E119 Type 2 diabetes mellitus without complications: Secondary | ICD-10-CM | POA: Insufficient documentation

## 2016-05-03 DIAGNOSIS — R05 Cough: Secondary | ICD-10-CM | POA: Insufficient documentation

## 2016-05-03 DIAGNOSIS — Z7982 Long term (current) use of aspirin: Secondary | ICD-10-CM | POA: Diagnosis not present

## 2016-05-03 DIAGNOSIS — R509 Fever, unspecified: Secondary | ICD-10-CM | POA: Diagnosis not present

## 2016-05-03 DIAGNOSIS — J449 Chronic obstructive pulmonary disease, unspecified: Secondary | ICD-10-CM | POA: Insufficient documentation

## 2016-05-03 DIAGNOSIS — Z8709 Personal history of other diseases of the respiratory system: Secondary | ICD-10-CM

## 2016-05-03 DIAGNOSIS — R69 Illness, unspecified: Secondary | ICD-10-CM

## 2016-05-03 DIAGNOSIS — J111 Influenza due to unidentified influenza virus with other respiratory manifestations: Secondary | ICD-10-CM

## 2016-05-03 DIAGNOSIS — I10 Essential (primary) hypertension: Secondary | ICD-10-CM | POA: Diagnosis not present

## 2016-05-03 DIAGNOSIS — R0981 Nasal congestion: Secondary | ICD-10-CM | POA: Diagnosis not present

## 2016-05-03 MED ORDER — SODIUM CHLORIDE 0.9 % IV BOLUS (SEPSIS)
1000.0000 mL | Freq: Once | INTRAVENOUS | Status: DC
Start: 2016-05-03 — End: 2016-05-03

## 2016-05-03 MED ORDER — INSULIN ASPART 100 UNIT/ML ~~LOC~~ SOLN: 8.0000 [IU] | Freq: Once | SUBCUTANEOUS | Status: DC

## 2016-05-03 MED ORDER — GUAIFENESIN-CODEINE 100-10 MG/5ML PO SOLN: 10.0000 mL | Freq: Four times a day (QID) | ORAL | 0 refills | Status: DC | PRN

## 2016-05-03 MED ORDER — DOXYCYCLINE HYCLATE 100 MG PO CAPS: 100.0000 mg | ORAL_CAPSULE | Freq: Two times a day (BID) | ORAL | 0 refills | Status: DC

## 2016-05-03 MED ORDER — ALBUTEROL SULFATE (2.5 MG/3ML) 0.083% IN NEBU: 2.5000 mg | INHALATION_SOLUTION | Freq: Four times a day (QID) | RESPIRATORY_TRACT | 0 refills | Status: DC | PRN

## 2016-05-03 MED FILL — DOXYCYCLINE HYC 100 MG CAP: 100 | 7 days supply | Qty: 14 | Fill #0

## 2016-05-03 MED FILL — ALBUTEROL 0.083% INHAL SOLN: (2.5 MG/3ML | 6 days supply | Qty: 75 | Fill #0

## 2016-05-03 MED FILL — VIRTUSSIN AC LIQUID: 100-10 | 3 days supply | Qty: 120 | Fill #0

## 2016-05-03 NOTE — Discharge Instructions (Signed)
Doxycycline as prescribed.  Robitussin with codeine as prescribed as needed for cough.  Albuterol nebulizer every 6 hours as needed for wheezing.  Follow-up with your primary Dr. in the next week if not improving.

## 2016-05-03 NOTE — ED Triage Notes (Signed)
Pt reports cough, subjective fevers, sore throat, and decreased appetite x Saturday.
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
## 2016-08-16 ENCOUNTER — Encounter (HOSPITAL_COMMUNITY): Payer: Self-pay | Admitting: Vascular Surgery

## 2016-08-16 ENCOUNTER — Emergency Department (HOSPITAL_COMMUNITY): Payer: Medicare HMO

## 2016-08-16 ENCOUNTER — Emergency Department (HOSPITAL_COMMUNITY)
Admission: EM | Admit: 2016-08-16 | Discharge: 2016-08-17 | Disposition: A | Discharge status: Home / Self Care | Payer: Medicare HMO | Attending: Emergency Medicine | Admitting: Emergency Medicine

## 2016-08-16 DIAGNOSIS — R0789 Other chest pain: Secondary | ICD-10-CM | POA: Diagnosis not present

## 2016-08-16 DIAGNOSIS — J449 Chronic obstructive pulmonary disease, unspecified: Secondary | ICD-10-CM | POA: Diagnosis not present

## 2016-08-16 DIAGNOSIS — E119 Type 2 diabetes mellitus without complications: Secondary | ICD-10-CM | POA: Insufficient documentation

## 2016-08-16 DIAGNOSIS — Z7982 Long term (current) use of aspirin: Secondary | ICD-10-CM | POA: Insufficient documentation

## 2016-08-16 DIAGNOSIS — I10 Essential (primary) hypertension: Secondary | ICD-10-CM | POA: Diagnosis not present

## 2016-08-16 DIAGNOSIS — Z8673 Personal history of transient ischemic attack (TIA), and cerebral infarction without residual deficits: Secondary | ICD-10-CM | POA: Insufficient documentation

## 2016-08-16 DIAGNOSIS — Z794 Long term (current) use of insulin: Secondary | ICD-10-CM | POA: Diagnosis not present

## 2016-08-16 DIAGNOSIS — Z79899 Other long term (current) drug therapy: Secondary | ICD-10-CM | POA: Diagnosis not present

## 2016-08-16 DIAGNOSIS — R079 Chest pain, unspecified: Secondary | ICD-10-CM | POA: Diagnosis present

## 2016-08-16 DIAGNOSIS — F329 Major depressive disorder, single episode, unspecified: Secondary | ICD-10-CM | POA: Insufficient documentation

## 2016-08-16 DIAGNOSIS — Z9049 Acquired absence of other specified parts of digestive tract: Secondary | ICD-10-CM | POA: Diagnosis not present

## 2016-08-16 LAB — BASIC METABOLIC PANEL
ANION GAP: 6 (ref 5–15)
BUN: 13 mg/dL (ref 6–20)
CALCIUM: 9.4 mg/dL (ref 8.9–10.3)
CO2: 24 mmol/L (ref 22–32)
CREATININE: 0.8 mg/dL (ref 0.44–1.00)
Chloride: 109 mmol/L (ref 101–111)
GLUCOSE: 158 mg/dL — AB (ref 65–99)
Potassium: 3.8 mmol/L (ref 3.5–5.1)
Sodium: 139 mmol/L (ref 135–145)

## 2016-08-16 LAB — CBC
HCT: 43.6 % (ref 36.0–46.0)
HEMOGLOBIN: 13.9 g/dL (ref 12.0–15.0)
MCH: 26.1 pg (ref 26.0–34.0)
MCHC: 31.9 g/dL (ref 30.0–36.0)
MCV: 81.8 fL (ref 78.0–100.0)
PLATELETS: 232 10*3/uL (ref 150–400)
RBC: 5.33 MIL/uL — ABNORMAL HIGH (ref 3.87–5.11)
RDW: 14 % (ref 11.5–15.5)
WBC: 7.3 10*3/uL (ref 4.0–10.5)

## 2016-08-16 LAB — I-STAT TROPONIN, ED: TROPONIN I, POC: 0.01 ng/mL (ref 0.00–0.08)

## 2016-08-16 MED ORDER — DEXAMETHASONE 4 MG PO TABS
6.0000 mg | ORAL_TABLET | Freq: Once | ORAL | Status: AC
  Administered 2016-08-16: 6 mg via ORAL
  Filled 2016-08-16: qty 2

## 2016-08-16 NOTE — ED Triage Notes (Signed)
Pt reports to the ED for eval of CP x 3 days. Pain is intermittent and sharp in nature. Describes it as a sharp pain. Pain radiates up into her neck. Pt reports some associated symptoms of N/V, lightheadedness, dizziness, or SOB. Pt has also had increased falls d/t he dizziness/lightheadedness. Pt took a nitro for the pain without relief in her symptoms.
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
## 2016-08-17 DIAGNOSIS — R0789 Other chest pain: Secondary | ICD-10-CM | POA: Diagnosis not present

## 2016-08-17 LAB — I-STAT TROPONIN, ED: TROPONIN I, POC: 0 ng/mL (ref 0.00–0.08)

## 2016-08-17 MED ORDER — TRAMADOL HCL 50 MG PO TABS
50.0000 mg | ORAL_TABLET | Freq: Once | ORAL | Status: AC
  Administered 2016-08-17: 50 mg via ORAL
  Filled 2016-08-17: qty 1

## 2016-08-17 MED ORDER — TRAMADOL HCL 50 MG PO TABS: 50.0000 mg | ORAL_TABLET | Freq: Four times a day (QID) | ORAL | 0 refills | Status: DC | PRN

## 2016-08-17 NOTE — Discharge Instructions (Signed)
Return if symptoms worsen

## 2016-08-19 ENCOUNTER — Other Ambulatory Visit (INDEPENDENT_AMBULATORY_CARE_PROVIDER_SITE_OTHER): Payer: Self-pay | Admitting: Orthopaedic Surgery

## 2016-09-16 ENCOUNTER — Emergency Department (HOSPITAL_BASED_OUTPATIENT_CLINIC_OR_DEPARTMENT_OTHER): Payer: Medicare HMO

## 2016-09-16 ENCOUNTER — Observation Stay (HOSPITAL_BASED_OUTPATIENT_CLINIC_OR_DEPARTMENT_OTHER)
Admission: EM | Admit: 2016-09-16 | Discharge: 2016-09-17 | Disposition: A | Discharge status: Home / Self Care | Payer: Medicare HMO | Attending: Internal Medicine | Admitting: Internal Medicine

## 2016-09-16 ENCOUNTER — Encounter (HOSPITAL_BASED_OUTPATIENT_CLINIC_OR_DEPARTMENT_OTHER): Payer: Self-pay | Admitting: *Deleted

## 2016-09-16 DIAGNOSIS — M545 Low back pain, unspecified: Secondary | ICD-10-CM | POA: Diagnosis present

## 2016-09-16 DIAGNOSIS — E1165 Type 2 diabetes mellitus with hyperglycemia: Secondary | ICD-10-CM | POA: Diagnosis not present

## 2016-09-16 DIAGNOSIS — I10 Essential (primary) hypertension: Secondary | ICD-10-CM

## 2016-09-16 DIAGNOSIS — R079 Chest pain, unspecified: Secondary | ICD-10-CM

## 2016-09-16 DIAGNOSIS — F329 Major depressive disorder, single episode, unspecified: Secondary | ICD-10-CM | POA: Diagnosis present

## 2016-09-16 DIAGNOSIS — R0789 Other chest pain: Principal | ICD-10-CM | POA: Insufficient documentation

## 2016-09-16 DIAGNOSIS — Z794 Long term (current) use of insulin: Secondary | ICD-10-CM

## 2016-09-16 DIAGNOSIS — J449 Chronic obstructive pulmonary disease, unspecified: Secondary | ICD-10-CM | POA: Diagnosis not present

## 2016-09-16 DIAGNOSIS — R200 Anesthesia of skin: Secondary | ICD-10-CM

## 2016-09-16 DIAGNOSIS — E118 Type 2 diabetes mellitus with unspecified complications: Secondary | ICD-10-CM | POA: Diagnosis not present

## 2016-09-16 DIAGNOSIS — R4789 Other speech disturbances: Secondary | ICD-10-CM | POA: Diagnosis not present

## 2016-09-16 DIAGNOSIS — E785 Hyperlipidemia, unspecified: Secondary | ICD-10-CM | POA: Diagnosis present

## 2016-09-16 DIAGNOSIS — R0602 Shortness of breath: Secondary | ICD-10-CM | POA: Insufficient documentation

## 2016-09-16 DIAGNOSIS — K219 Gastro-esophageal reflux disease without esophagitis: Secondary | ICD-10-CM | POA: Diagnosis not present

## 2016-09-16 DIAGNOSIS — R5383 Other fatigue: Secondary | ICD-10-CM | POA: Insufficient documentation

## 2016-09-16 DIAGNOSIS — Z7982 Long term (current) use of aspirin: Secondary | ICD-10-CM | POA: Insufficient documentation

## 2016-09-16 DIAGNOSIS — IMO0002 Reserved for concepts with insufficient information to code with codable children: Secondary | ICD-10-CM | POA: Diagnosis present

## 2016-09-16 DIAGNOSIS — F32A Depression, unspecified: Secondary | ICD-10-CM | POA: Diagnosis present

## 2016-09-16 DIAGNOSIS — Z79899 Other long term (current) drug therapy: Secondary | ICD-10-CM | POA: Insufficient documentation

## 2016-09-16 DIAGNOSIS — Z8673 Personal history of transient ischemic attack (TIA), and cerebral infarction without residual deficits: Secondary | ICD-10-CM | POA: Insufficient documentation

## 2016-09-16 DIAGNOSIS — F419 Anxiety disorder, unspecified: Secondary | ICD-10-CM | POA: Insufficient documentation

## 2016-09-16 DIAGNOSIS — E1143 Type 2 diabetes mellitus with diabetic autonomic (poly)neuropathy: Secondary | ICD-10-CM | POA: Diagnosis not present

## 2016-09-16 DIAGNOSIS — R531 Weakness: Secondary | ICD-10-CM | POA: Diagnosis not present

## 2016-09-16 DIAGNOSIS — E119 Type 2 diabetes mellitus without complications: Secondary | ICD-10-CM | POA: Diagnosis not present

## 2016-09-16 DIAGNOSIS — G8929 Other chronic pain: Secondary | ICD-10-CM | POA: Diagnosis present

## 2016-09-16 DIAGNOSIS — R479 Unspecified speech disturbances: Secondary | ICD-10-CM

## 2016-09-16 LAB — URINALYSIS, ROUTINE W REFLEX MICROSCOPIC
Bilirubin Urine: NEGATIVE
Glucose, UA: >=500 mg/dL — AB
Hgb urine dipstick: NEGATIVE
Ketones, ur: NEGATIVE mg/dL
LEUKOCYTES UA: NEGATIVE
NITRITE: NEGATIVE
PROTEIN: NEGATIVE mg/dL
SPECIFIC GRAVITY, URINE: 1.008 (ref 1.005–1.030)
pH: 6.5 (ref 5.0–8.0)

## 2016-09-16 LAB — COMPREHENSIVE METABOLIC PANEL
ALBUMIN: 4.3 g/dL (ref 3.5–5.0)
ALK PHOS: 67 U/L (ref 38–126)
ALT: 20 U/L (ref 14–54)
ANION GAP: 8 (ref 5–15)
AST: 21 U/L (ref 15–41)
BILIRUBIN TOTAL: 0.2 mg/dL — AB (ref 0.3–1.2)
BUN: 13 mg/dL (ref 6–20)
CALCIUM: 8.9 mg/dL (ref 8.9–10.3)
CO2: 25 mmol/L (ref 22–32)
CREATININE: 1 mg/dL (ref 0.44–1.00)
Chloride: 106 mmol/L (ref 101–111)
GFR calc Af Amer: >60 mL/min (ref >60)
GFR calc non Af Amer: >60 mL/min (ref >60)
GLUCOSE: 139 mg/dL — AB (ref 65–99)
Potassium: 3.7 mmol/L (ref 3.5–5.1)
Sodium: 139 mmol/L (ref 135–145)
TOTAL PROTEIN: 7.5 g/dL (ref 6.5–8.1)

## 2016-09-16 LAB — CBC
HCT: 40.5 % (ref 36.0–46.0)
HEMOGLOBIN: 12.8 g/dL (ref 12.0–15.0)
MCH: 26.2 pg (ref 26.0–34.0)
MCHC: 31.6 g/dL (ref 30.0–36.0)
MCV: 82.8 fL (ref 78.0–100.0)
Platelets: 216 10*3/uL (ref 150–400)
RBC: 4.89 MIL/uL (ref 3.87–5.11)
RDW: 14.2 % (ref 11.5–15.5)
WBC: 5.2 10*3/uL (ref 4.0–10.5)

## 2016-09-16 LAB — URINALYSIS, MICROSCOPIC (REFLEX)
BACTERIA UA: NONE SEEN
RBC / HPF: NONE SEEN RBC/hpf (ref 0–5)
WBC, UA: NONE SEEN WBC/hpf (ref 0–5)

## 2016-09-16 LAB — RAPID URINE DRUG SCREEN, HOSP PERFORMED
Amphetamines: NOT DETECTED
Barbiturates: NOT DETECTED
Benzodiazepines: NOT DETECTED
Cocaine: NOT DETECTED
Opiates: POSITIVE — AB
Tetrahydrocannabinol: NOT DETECTED

## 2016-09-16 LAB — DIFFERENTIAL
Basophils Absolute: 0 10*3/uL (ref 0.0–0.1)
Basophils Relative: 1 %
EOS PCT: 3 %
Eosinophils Absolute: 0.2 10*3/uL (ref 0.0–0.7)
LYMPHS ABS: 2.3 10*3/uL (ref 0.7–4.0)
LYMPHS PCT: 42 %
Monocytes Absolute: 0.6 10*3/uL (ref 0.1–1.0)
Monocytes Relative: 12 %
NEUTROS PCT: 42 %
Neutro Abs: 2.2 10*3/uL (ref 1.7–7.7)

## 2016-09-16 LAB — TROPONIN I: Troponin I: <0.03 ng/mL (ref <0.03)

## 2016-09-16 LAB — GLUCOSE, CAPILLARY: Glucose-Capillary: 107 mg/dL — ABNORMAL HIGH (ref 65–99)

## 2016-09-16 LAB — PROTIME-INR
INR: 0.91
Prothrombin Time: 12.2 seconds (ref 11.4–15.2)

## 2016-09-16 LAB — ETHANOL: Alcohol, Ethyl (B): <5 mg/dL (ref <5)

## 2016-09-16 LAB — APTT: aPTT: 33 seconds (ref 24–36)

## 2016-09-16 MED ORDER — FAMOTIDINE 20 MG PO TABS
20.0000 mg | ORAL_TABLET | Freq: Two times a day (BID) | ORAL | Status: DC
  Administered 2016-09-17 (×2): 20 mg via ORAL
  Filled 2016-09-16 (×2): qty 1

## 2016-09-16 MED ORDER — LATANOPROST 0.005 % OP SOLN: 1.0000 [drp] | Freq: Every day | OPHTHALMIC | Status: DC

## 2016-09-16 MED ORDER — HYDROCODONE-ACETAMINOPHEN 10-325 MG PO TABS
1.0000 | ORAL_TABLET | Freq: Four times a day (QID) | ORAL | Status: DC
  Administered 2016-09-17 (×3): 1 via ORAL
  Filled 2016-09-16 (×3): qty 1

## 2016-09-16 MED ORDER — LINACLOTIDE 145 MCG PO CAPS
145.0000 ug | ORAL_CAPSULE | Freq: Every day | ORAL | Status: DC
  Administered 2016-09-17: 145 ug via ORAL
  Filled 2016-09-16: qty 1

## 2016-09-16 MED ORDER — ASPIRIN EC 81 MG PO TBEC
81.0000 mg | DELAYED_RELEASE_TABLET | Freq: Every day | ORAL | Status: DC
  Administered 2016-09-17: 81 mg via ORAL
  Filled 2016-09-16: qty 1

## 2016-09-16 MED ORDER — TOPIRAMATE 100 MG PO TABS
100.0000 mg | ORAL_TABLET | Freq: Two times a day (BID) | ORAL | Status: DC
  Administered 2016-09-17 (×2): 100 mg via ORAL
  Filled 2016-09-16 (×2): qty 1

## 2016-09-16 MED ORDER — ROSUVASTATIN CALCIUM 10 MG PO TABS
40.0000 mg | ORAL_TABLET | Freq: Every day | ORAL | Status: DC
  Administered 2016-09-17: 40 mg via ORAL
  Filled 2016-09-16: qty 4

## 2016-09-16 MED ORDER — GABAPENTIN 300 MG PO CAPS
300.0000 mg | ORAL_CAPSULE | Freq: Two times a day (BID) | ORAL | Status: DC
  Administered 2016-09-17 (×2): 300 mg via ORAL
  Filled 2016-09-16 (×2): qty 1

## 2016-09-16 MED ORDER — MORPHINE SULFATE (PF) 2 MG/ML IV SOLN: 2.0000 mg | INTRAVENOUS | Status: DC | PRN

## 2016-09-16 MED ORDER — FLUOXETINE HCL 20 MG PO CAPS
20.0000 mg | ORAL_CAPSULE | Freq: Every day | ORAL | Status: DC
  Administered 2016-09-17: 20 mg via ORAL
  Filled 2016-09-16: qty 1

## 2016-09-16 MED ORDER — FOLIC ACID 1 MG PO TABS
1.0000 mg | ORAL_TABLET | Freq: Every day | ORAL | Status: DC
  Administered 2016-09-17: 1 mg via ORAL
  Filled 2016-09-16: qty 1

## 2016-09-16 MED ORDER — PREGABALIN 50 MG PO CAPS
75.0000 mg | ORAL_CAPSULE | Freq: Two times a day (BID) | ORAL | Status: DC
  Administered 2016-09-17 (×2): 75 mg via ORAL
  Filled 2016-09-16 (×2): qty 1

## 2016-09-16 MED ORDER — INSULIN GLARGINE 100 UNIT/ML ~~LOC~~ SOLN
10.0000 [IU] | Freq: Every day | SUBCUTANEOUS | Status: DC
  Administered 2016-09-17: 10 [IU] via SUBCUTANEOUS
  Filled 2016-09-16 (×2): qty 0.1

## 2016-09-16 MED ORDER — TRAMADOL HCL 50 MG PO TABS: 50.0000 mg | ORAL_TABLET | Freq: Four times a day (QID) | ORAL | Status: DC | PRN

## 2016-09-16 MED ORDER — LORATADINE 10 MG PO TABS
10.0000 mg | ORAL_TABLET | Freq: Every day | ORAL | Status: DC
  Administered 2016-09-17: 10 mg via ORAL
  Filled 2016-09-16: qty 1

## 2016-09-16 MED ORDER — INSULIN ASPART 100 UNIT/ML ~~LOC~~ SOLN: 0.0000 [IU] | Freq: Every day | SUBCUTANEOUS | Status: DC

## 2016-09-16 MED ORDER — ASPIRIN 81 MG PO CHEW
324.0000 mg | CHEWABLE_TABLET | Freq: Once | ORAL | Status: AC
  Administered 2016-09-16: 324 mg via ORAL
  Filled 2016-09-16: qty 4

## 2016-09-16 MED ORDER — ACETAMINOPHEN 325 MG PO TABS: 650.0000 mg | ORAL_TABLET | ORAL | Status: DC | PRN

## 2016-09-16 MED ORDER — VITAMIN D 1000 UNITS PO TABS
5000.0000 [IU] | ORAL_TABLET | Freq: Every day | ORAL | Status: DC
  Administered 2016-09-17: 5000 [IU] via ORAL
  Filled 2016-09-16: qty 5

## 2016-09-16 MED ORDER — ALBUTEROL SULFATE (2.5 MG/3ML) 0.083% IN NEBU: 2.5000 mg | INHALATION_SOLUTION | Freq: Four times a day (QID) | RESPIRATORY_TRACT | Status: DC | PRN

## 2016-09-16 MED ORDER — SENNA 8.6 MG PO TABS: 1.0000 | ORAL_TABLET | Freq: Every day | ORAL | Status: DC | PRN

## 2016-09-16 MED ORDER — ONDANSETRON HCL 4 MG/2ML IJ SOLN: 4.0000 mg | Freq: Four times a day (QID) | INTRAMUSCULAR | Status: DC | PRN

## 2016-09-16 MED ORDER — FLUTICASONE FUROATE-VILANTEROL 200-25 MCG/INH IN AEPB
1.0000 | INHALATION_SPRAY | Freq: Every day | RESPIRATORY_TRACT | Status: DC
  Administered 2016-09-17: 09:00:00 1 via RESPIRATORY_TRACT
  Filled 2016-09-16: qty 28

## 2016-09-16 MED ORDER — NITROGLYCERIN 0.4 MG SL SUBL: 0.4000 mg | SUBLINGUAL_TABLET | SUBLINGUAL | Status: DC | PRN

## 2016-09-16 MED ORDER — INSULIN ASPART 100 UNIT/ML ~~LOC~~ SOLN
0.0000 [IU] | Freq: Three times a day (TID) | SUBCUTANEOUS | Status: DC
  Administered 2016-09-17: 1 [IU] via SUBCUTANEOUS

## 2016-09-16 MED ORDER — ENOXAPARIN SODIUM 40 MG/0.4ML ~~LOC~~ SOLN
40.0000 mg | Freq: Every day | SUBCUTANEOUS | Status: DC
  Administered 2016-09-17: 40 mg via SUBCUTANEOUS
  Filled 2016-09-16: qty 0.4

## 2016-09-16 MED ORDER — TRAZODONE HCL 100 MG PO TABS: 100.0000 mg | ORAL_TABLET | Freq: Every evening | ORAL | Status: DC | PRN

## 2016-09-16 MED ORDER — GUAIFENESIN-CODEINE 100-10 MG/5ML PO SOLN: 10.0000 mL | Freq: Four times a day (QID) | ORAL | Status: DC | PRN

## 2016-09-16 MED ORDER — PANTOPRAZOLE SODIUM 40 MG PO TBEC
40.0000 mg | DELAYED_RELEASE_TABLET | Freq: Two times a day (BID) | ORAL | Status: DC
  Administered 2016-09-17 (×2): 40 mg via ORAL
  Filled 2016-09-16 (×2): qty 1

## 2016-09-16 MED ORDER — VITAMIN B-12 1000 MCG PO TABS
1000.0000 ug | ORAL_TABLET | Freq: Every day | ORAL | Status: DC
  Administered 2016-09-17: 1000 ug via ORAL
  Filled 2016-09-16: qty 1

## 2016-09-16 MED ORDER — POLYETHYLENE GLYCOL 3350 17 G PO PACK: 17.0000 g | PACK | Freq: Every day | ORAL | Status: DC | PRN
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
## 2016-09-16 NOTE — Progress Notes (Signed)
Annette Norman is accepted to [facility] telemetry unit under observation status from [facility] (EDP Dr. Rubin Payor) for chest pain and left-sided numbness.   She has hx of DM, HLD, HTN, GERD, and now has 2 days of intermittent chest pain, none now. Also noted left-sided paresthesia today.   AF VSS. Head CT with nothing acute, initial troponin undetectable, and essentially normal EKG and CXR. Basic labs unremarkable.   Treated with ASA 324 mg.

## 2016-09-16 NOTE — ED Triage Notes (Signed)
Chest pain, sob and weakness on her left side x 2 days. She was seen by her MD and drove her after leaving the office. She had the same symptoms a month ago and was seen at [facility] a month ago for the same and had a normal exam.

## 2016-09-16 NOTE — ED Notes (Signed)
Pt. Reports to RN she has had language difficulty such as repeating her words and stuttering in the past several days.   None of this going on with Pt. At time of assessment.  Pt. Is alert and oriented with complete awareness of her skills and speech..no noted difficulty with speech pattern and no noted change in cadence when Pt. Talking about her problems.  Pt. Has noted equal strength in arms and legs when assessing Pt.   Pt. Lifts arms equal and legs equal.
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
## 2016-09-17 ENCOUNTER — Observation Stay (HOSPITAL_COMMUNITY): Payer: Medicare HMO

## 2016-09-17 DIAGNOSIS — R531 Weakness: Secondary | ICD-10-CM | POA: Diagnosis not present

## 2016-09-17 DIAGNOSIS — R0789 Other chest pain: Secondary | ICD-10-CM | POA: Diagnosis not present

## 2016-09-17 DIAGNOSIS — I10 Essential (primary) hypertension: Secondary | ICD-10-CM | POA: Diagnosis not present

## 2016-09-17 DIAGNOSIS — R200 Anesthesia of skin: Secondary | ICD-10-CM

## 2016-09-17 DIAGNOSIS — R4789 Other speech disturbances: Secondary | ICD-10-CM | POA: Diagnosis not present

## 2016-09-17 DIAGNOSIS — R079 Chest pain, unspecified: Secondary | ICD-10-CM | POA: Diagnosis not present

## 2016-09-17 DIAGNOSIS — E1143 Type 2 diabetes mellitus with diabetic autonomic (poly)neuropathy: Secondary | ICD-10-CM | POA: Diagnosis not present

## 2016-09-17 DIAGNOSIS — J449 Chronic obstructive pulmonary disease, unspecified: Secondary | ICD-10-CM | POA: Diagnosis not present

## 2016-09-17 LAB — BASIC METABOLIC PANEL
ANION GAP: 9 (ref 5–15)
BUN: 13 mg/dL (ref 6–20)
CHLORIDE: 110 mmol/L (ref 101–111)
CO2: 22 mmol/L (ref 22–32)
Calcium: 8.7 mg/dL — ABNORMAL LOW (ref 8.9–10.3)
Creatinine, Ser: 0.99 mg/dL (ref 0.44–1.00)
GFR calc Af Amer: >60 mL/min (ref >60)
GLUCOSE: 150 mg/dL — AB (ref 65–99)
POTASSIUM: 3.6 mmol/L (ref 3.5–5.1)
Sodium: 141 mmol/L (ref 135–145)

## 2016-09-17 LAB — GLUCOSE, CAPILLARY
GLUCOSE-CAPILLARY: 99 mg/dL (ref 65–99)
GLUCOSE-CAPILLARY: 143 mg/dL — AB (ref 65–99)
Glucose-Capillary: 133 mg/dL — ABNORMAL HIGH (ref 65–99)

## 2016-09-17 LAB — TROPONIN I: Troponin I: <0.03 ng/mL (ref <0.03)

## 2016-09-17 LAB — HIV ANTIBODY (ROUTINE TESTING W REFLEX): HIV SCREEN 4TH GENERATION: NONREACTIVE

## 2016-09-17 MED ORDER — DICLOFENAC SODIUM 1 % TD GEL
2.0000 g | Freq: Four times a day (QID) | TRANSDERMAL | Status: DC
  Administered 2016-09-17 (×2): 2 g via TOPICAL
  Filled 2016-09-17: qty 100

## 2016-09-17 MED ORDER — DICLOFENAC SODIUM 1 % TD GEL: 2.0000 g | Freq: Four times a day (QID) | TRANSDERMAL | 0 refills | Status: DC

## 2016-09-17 MED ORDER — KETOROLAC TROMETHAMINE 30 MG/ML IJ SOLN
30.0000 mg | Freq: Four times a day (QID) | INTRAMUSCULAR | Status: DC
  Administered 2016-09-17: 30 mg via INTRAVENOUS
  Filled 2016-09-17: qty 1

## 2016-09-17 MED ORDER — POLYMYXIN B-TRIMETHOPRIM 10000-0.1 UNIT/ML-% OP SOLN
1.0000 [drp] | Freq: Two times a day (BID) | OPHTHALMIC | Status: DC
  Administered 2016-09-17: 1 [drp] via OPHTHALMIC
  Filled 2016-09-17: qty 10

## 2016-09-17 MED ORDER — DIPHENHYDRAMINE HCL 25 MG PO TABS
25.0000 mg | ORAL_TABLET | Freq: Four times a day (QID) | ORAL | Status: DC | PRN
  Administered 2016-09-17: 25 mg via ORAL
  Filled 2016-09-17 (×3): qty 1

## 2016-09-17 MED ORDER — INSULIN GLARGINE 100 UNIT/ML ~~LOC~~ SOLN: 10.0000 [IU] | Freq: Every day | SUBCUTANEOUS | 11 refills | Status: DC

## 2016-09-17 NOTE — Progress Notes (Signed)
Patient received discharge instructions. Patient denied any further chest pain. Added voltaren gel for discomfort. PIV removed. Patient verbalized understanding and said she had no further questions. Escorted out via wheelchair with nurse tech.

## 2016-09-17 NOTE — Discharge Summary (Signed)
Physician Discharge Summary  Annette Norman ZOX:[id number] DOB: [birth year] DOA: 09/16/2016  PCP: Annette Amen, MD  Admit date: 09/16/2016 Discharge date: 09/17/2016  Admitted From: home Disposition:  home     Discharge Condition:  stable   CODE STATUS:  Full code   Consultations:  none    Discharge Diagnoses:  Principal Problem:   Chest pain Active Problems:   Numbness on left side   HLD (hyperlipidemia)   Uncontrolled type 2 diabetes mellitus with diabetic autonomic neuropathy, without long-term current use of insulin (HCC)   Esophageal reflux   Depression   Benign essential HTN   Chronic bilateral low back pain without sciatica   COPD (chronic obstructive pulmonary disease) (HCC)    Annette Norman is a 51 y.o. female with medical history significant for COPD, chronic migraines, depression with anxiety, insulin-dependent diabetes mellitus, and GERD, presenting to the emergency department for evaluation of chest pain, dyspnea, and left-sided weakness. Patient reports that she has been suffering from intermittent chest pain, weakness, and paresthesias for the past 2 weeks, but all of the symptoms have worsened over the past 2 days. She describes the pain as a pressure sensation in the central chest, associated with shortness of breath, without cough, and without any alleviating or exacerbating factors identified. 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 She reports intermittent weakness and tingling of the left face, left arm, and left leg over the past 2 weeks, but more consistent over the past 2 days. She is blind in the left eye, but denies any recent change in her vision or hearing, and denies any confusion or loss of coordination.   Subjective: Left chest pain is constant for 2 days- noted to be reproducible on exam. Left side still feels numb and left leg is jumping occasionally. She has  chronic generalized weakness but is extremity fatigued for past 2 days. Has a runny nose and a sore throat. No fever or chills.    No other complaints.   Assessment & Plan:   Principal Problem:   Chest pain- musculoskeletal  - cardiac enzymes negative, constant pain which is worse when palpating left chest- Voltaren gel and Toradol -no further cardiac work up  Left sided numbness/ jerking of left leg - CT head and neck unrevealing - MRI brain negative as well - she is on a baby aspirin daily which I have not changed  Severe fatigue - x 2 days with sore throat and runny nose- ? Viral infection- no fever noted- symptomatic treatment  Anxiety/ depression/ chronic pain - cont home meds    Discharge Instructions  Discharge Instructions    Diet - low sodium heart healthy    Complete by:  As directed    Increase activity slowly    Complete by:  As directed      Allergies as of 09/17/2016      Reactions   Bee Venom Anaphylaxis   Amitriptyline Hives, Rash   Nortriptyline Hives   Norco [hydrocodone-acetaminophen] Itching, Other (See Comments)   Can also tolerate Percocet, but MUST "pre-medicate" with Benadryl   Zithromax [azithromycin] Hives      Medication List    TAKE these medications   albuterol (2.5 MG/3ML) 0.083% nebulizer solution Commonly known as:  PROVENTIL Take 3 mLs (2.5 mg total) by nebulization every 6 (six) hours as needed for wheezing or shortness of breath.   VENTOLIN HFA 108 (90 Base) MCG/ACT inhaler Generic  drug:  albuterol Inhale 1-2 puffs into the lungs every 6 (six) hours as needed for wheezing or shortness of breath.   aspirin EC 81 MG tablet Take 81 mg by mouth daily.   BREO ELLIPTA 200-25 MCG/INH Aepb Generic drug:  fluticasone furoate-vilanterol Inhale 1 puff into the lungs daily.   brimonidine-timolol 0.2-0.5 % ophthalmic solution Commonly known as:  COMBIGAN Place 1 drop into the left eye 2 (two) times daily.   CRESTOR 40 MG  tablet Generic drug:  rosuvastatin Take 40 mg by mouth daily.   diclofenac sodium 1 % Gel Commonly known as:  VOLTAREN Apply 2 g topically 4 (four) times daily.   diphenhydrAMINE 25 MG tablet Commonly known as:  BENADRYL Take 2 tablets (50 mg total) by mouth every 12 (twelve) hours. What changed:  how much to take  when to take this  additional instructions   dorzolamide 2 % ophthalmic solution Commonly known as:  TRUSOPT Place 1 drop into the left eye 3 (three) times daily.   esomeprazole 40 MG capsule Commonly known as:  NEXIUM Take 40 mg by mouth 2 (two) times daily before a meal.   FLUoxetine 40 MG capsule Commonly known as:  PROZAC Take 40 mg by mouth daily.   folic acid 1 MG tablet Commonly known as:  FOLVITE Take 1 mg by mouth daily.   gabapentin 300 MG capsule Commonly known as:  NEURONTIN Take 300 mg by mouth 2 (two) times daily.   HYDROcodone-acetaminophen 10-325 MG tablet Commonly known as:  NORCO Take 1 tablet by mouth 3 (three) times daily.   Insulin Detemir 100 UNIT/ML Pen Commonly known as:  LEVEMIR Inject 15 Units into the skin daily at 10 pm.   insulin glargine 100 UNIT/ML injection Commonly known as:  LANTUS Inject 0.1 mLs (10 Units total) into the skin at bedtime.   Insulin Pen Needle 32G X 6 MM Misc Commonly known as:  CAREFINE PEN NEEDLES Use daily to inject insulin as instructed   INVOKAMET 50-1000 MG Tabs Generic drug:  Canagliflozin-Metformin HCl Take 1 tablet by mouth 2 (two) times daily.   LINZESS 145 MCG Caps capsule Generic drug:  linaclotide Take 145 mcg by mouth daily.   loratadine 10 MG tablet Commonly known as:  CLARITIN Take 10 mg by mouth daily.   LYRICA 75 MG capsule Generic drug:  pregabalin Take 75 mg by mouth 2 (two) times daily.   meloxicam 15 MG tablet Commonly known as:  MOBIC TAKE 1 TABLET BY MOUTH DAILY AS NEEDED FOR PAIN What changed:  See the new instructions.   ondansetron 4 MG tablet Commonly  known as:  ZOFRAN Take 4 mg by mouth every 8 (eight) hours as needed for nausea.   pantoprazole 40 MG tablet Commonly known as:  PROTONIX Take 40 mg by mouth 2 (two) times daily.   ranitidine 150 MG tablet Commonly known as:  ZANTAC Take 150 mg by mouth 2 (two) times daily.   rizatriptan 10 MG disintegrating tablet Commonly known as:  MAXALT-MLT Take 1 tablet (10 mg total) by mouth as needed for migraine. May repeat in 2 hours if needed   topiramate 50 MG tablet Commonly known as:  TOPAMAX Take 1-2 tablets (50-100 mg total) by mouth 2 (two) times daily. What changed:  how much to take   TRAVATAN Z 0.004 % Soln ophthalmic solution Generic drug:  Travoprost (BAK Free) Place 1 drop into the left eye at bedtime.   traZODone 100 MG tablet Commonly known  as:  DESYREL Take 100 mg by mouth at bedtime as needed for sleep.   trimethoprim-polymyxin b ophthalmic solution Commonly known as:  POLYTRIM Place 1 drop into the left eye 2 (two) times daily.   vitamin B-12 1000 MCG tablet Commonly known as:  CYANOCOBALAMIN Take 1,000 mcg by mouth daily.   Vitamin D3 5000 units Caps Take 5,000 Units by mouth daily.            Discharge Care Instructions        Start     Ordered   09/17/16 0000  diclofenac sodium (VOLTAREN) 1 % GEL  4 times daily     09/17/16 1641   09/17/16 0000  insulin glargine (LANTUS) 100 UNIT/ML injection  Daily at bedtime     09/17/16 1641   09/17/16 0000  Increase activity slowly     09/17/16 1641   09/17/16 0000  Diet - low sodium heart healthy     09/17/16 1641      Allergies  Allergen Reactions  . Bee Venom Anaphylaxis  . Amitriptyline Hives and Rash  . Nortriptyline Hives  . Norco [Hydrocodone-Acetaminophen] Itching and Other (See Comments)    Can also tolerate Percocet, but MUST "pre-medicate" with Benadryl  . Zithromax [Azithromycin] Hives     Procedures/Studies:  Dg Chest 2 View  Result Date: 09/16/2016 CLINICAL DATA:  51 year old  female with a history of left-sided chest pain EXAM: CHEST  2 VIEW COMPARISON:  None. FINDINGS: Cardiomediastinal silhouette unchanged in size and contour. No evidence of central vascular congestion. No pneumothorax or pleural effusion. No confluent airspace disease. No displaced fracture IMPRESSION: No radiographic evidence of acute cardiopulmonary disease Electronically Signed   By: Gilmer Mor D.O.   On: 09/16/2016 15:50   Ct Head Wo Contrast  Result Date: 09/16/2016 CLINICAL DATA:  Shortness of breath and dizziness for 2 weeks. Headache. EXAM: CT HEAD WITHOUT CONTRAST TECHNIQUE: Contiguous axial images were obtained from the base of the skull through the vertex without intravenous contrast. COMPARISON:  Brain MR 04/29/2015; report not available. Head CT of 07/06/2014. FINDINGS: Brain: Mild cerebral atrophy for age. Again identified is mild low density in the periventricular white matter likely related to small vessel disease. No mass lesion, hemorrhage, hydrocephalus, acute infarct, intra-axial, or extra-axial fluid collection. Vascular: No hyperdense vessel or unexpected calcification. Skull: Normal Sinuses/Orbits: Surgical changes about the left globe. Left ethmoid air cell mucosal thickening is mild. Hypoplastic frontal sinuses. Clear mastoid air cells. Other: None. IMPRESSION: 1.  No acute intracranial abnormality. 2.  Cerebral atrophy and small vessel ischemic change. 3. Mild sinus disease. Electronically Signed   By: Jeronimo Greaves M.D.   On: 09/16/2016 16:01   Ct Cervical Spine Wo Contrast  Result Date: 09/17/2016 CLINICAL DATA:  Whole left side numbness and tingling per pt. EXAM: CT CERVICAL SPINE WITHOUT CONTRAST TECHNIQUE: Multidetector CT imaging of the cervical spine was performed without intravenous contrast. Multiplanar CT image reconstructions were also generated. COMPARISON:  MR 04/29/2015 FINDINGS: Alignment: Normal. Skull base and vertebrae: No acute fracture. No primary bone lesion or  focal pathologic process. Soft tissues and spinal canal: No prevertebral fluid or swelling. No visible canal hematoma. Minimal calcified plaque in the left carotid bifurcation. Disc levels: No significant disc narrowing. Small anterior endplate spurring J2-E2. Facet DJD resulting in early foraminal encroachment left C2-3. Upper chest: Negative. Other: None. IMPRESSION: 1. Negative for fracture or other acute bone abnormality. 2. 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 Electronically Signed   By: Algis Downs  Deanne Coffer M.D.   On: 09/17/2016 13:01   Mr Brain Wo Contrast  Result Date: 09/17/2016 CLINICAL DATA:  Left-sided numbness and weakness. EXAM: MRI HEAD WITHOUT CONTRAST TECHNIQUE: Multiplanar, multiecho pulse sequences of the brain and surrounding structures were obtained without intravenous contrast. COMPARISON:  Head CT from yesterday.  Brain MRI 04/29/2015 FINDINGS: Brain: No acute infarction, hemorrhage, hydrocephalus, extra-axial collection or mass lesion. Numerous FLAIR hyperintensities in the deep and subcortical cerebral white matter, essentially stable from 04/29/2015. History of multiple vascular risk factors favors chronic small vessel ischemia. No specific demyelinating pattern. Dilated perivascular space below the left putamen. Vascular: Major flow voids are preserved. Skull and upper cervical spine: Negative for marrow lesion. Sinuses/Orbits: Artifact about the left globe correlating with glaucoma reservoir by CT. Other: Intermittently motion degraded. IMPRESSION: 1. No acute finding or explanation for weakness. 2. Mild chronic matter disease similar to 2017 comparison, medical history favoring chronic small vessel ischemia. Electronically Signed   By: Marnee Spring M.D.   On: 09/17/2016 15:47        Discharge Exam: Vitals:   09/17/16 0912 09/17/16 0926  BP:    Pulse:    Resp:  16  Temp:    SpO2: 97%    Vitals:   09/16/16 2108 09/16/16 2210 09/17/16 0912 09/17/16 0926  BP: 101/62 129/66    Pulse: 64 70     Resp: 16 16  16   Temp:  98.4 F (36.9 C)    TempSrc:  Oral    SpO2: 99% 99% 97%   Weight:  72.9 kg (160 lb 11.2 oz)    Height:  5\' 3"  (1.6 m)      General: Pt is alert, awake, not in acute distress Cardiovascular: RRR, S1/S2 +, no rubs, no gallops Respiratory: CTA bilaterally, no wheezing, no rhonchi Abdominal: Soft, NT, ND, bowel sounds + Extremities: no edema, no cyanosis    The results of significant diagnostics from this hospitalization (including imaging, microbiology, ancillary and laboratory) are listed below for reference.     Microbiology: No results found for this or any previous visit (from the past 240 hour(s)).   Labs: BNP (last 3 results) No results for input(s): BNP in the last 8760 hours. Basic Metabolic Panel:  Recent Labs Lab 09/16/16 1525 09/17/16 0320  NA 139 141  K 3.7 3.6  CL 106 110  CO2 25 22  GLUCOSE 139* 150*  BUN 13 13  CREATININE 1.00 0.99  CALCIUM 8.9 8.7*   Liver Function Tests:  Recent Labs Lab 09/16/16 1525  AST 21  ALT 20  ALKPHOS 67  BILITOT 0.2*  PROT 7.5  ALBUMIN 4.3   No results for input(s): LIPASE, AMYLASE in the last 168 hours. No results for input(s): AMMONIA in the last 168 hours. CBC:  Recent Labs Lab 09/16/16 1525  WBC 5.2  NEUTROABS 2.2  HGB 12.8  HCT 40.5  MCV 82.8  PLT 216   Cardiac Enzymes:  Recent Labs Lab 09/16/16 1525 09/16/16 1945 09/17/16 0320 09/17/16 1014  TROPONINI <0.03 <0.03 <0.03 <0.03   BNP: Invalid input(s): POCBNP CBG:  Recent Labs Lab 09/16/16 2225 09/17/16 0810 09/17/16 1140  GLUCAP 107* 99 133*   D-Dimer No results for input(s): DDIMER in the last 72 hours. Hgb A1c No results for input(s): HGBA1C in the last 72 hours. Lipid Profile No results for input(s): CHOL, HDL, LDLCALC, TRIG, CHOLHDL, LDLDIRECT in the last 72 hours. Thyroid function studies No results for input(s): TSH, T4TOTAL, T3FREE, THYROIDAB in the last  72 hours.  Invalid input(s):  FREET3 Anemia work up No results for input(s): VITAMINB12, FOLATE, FERRITIN, TIBC, IRON, RETICCTPCT in the last 72 hours. Urinalysis    Component Value Date/Time   COLORURINE YELLOW 09/16/2016 1607   APPEARANCEUR CLOUDY (A) 09/16/2016 1607   LABSPEC 1.008 09/16/2016 1607   PHURINE 6.5 09/16/2016 1607   GLUCOSEU >=500 (A) 09/16/2016 1607   HGBUR NEGATIVE 09/16/2016 1607   BILIRUBINUR NEGATIVE 09/16/2016 1607   KETONESUR NEGATIVE 09/16/2016 1607   PROTEINUR NEGATIVE 09/16/2016 1607   UROBILINOGEN 0.2 07/06/2014 1600   NITRITE NEGATIVE 09/16/2016 1607   LEUKOCYTESUR NEGATIVE 09/16/2016 1607   Sepsis Labs Invalid input(s): PROCALCITONIN,  WBC,  LACTICIDVEN Microbiology No results found for this or any previous visit (from the past 240 hour(s)).   Time coordinating discharge: Over 30 minutes  SIGNED:   Calvert Cantor, MD  [facility] 09/17/2016, 4:41 PM Pager   If 7PM-7AM, please contact night-coverage [email or web address] Password [email or web address]

## 2016-09-17 NOTE — Plan of Care (Signed)
Problem: Education: Goal: Knowledge of [facility] Education information/materials will improve Outcome: Progressing Patient with uneventful evening. Neuro checks overall negative, patient claims to have decreased sensation in all 4 extremities but also says "its always like that". Strength equal bilaterally, limbs without drift. NIH score 1 for decreased sensation. Patient with left eye blindness and cloudiness, baseline per patient, ocular movement still intact. No vision changes in right eye. Patient NSR on tele. On roomair 98%. Pain controlled with scheduled medications at this time.

## 2016-10-24 ENCOUNTER — Telehealth: Payer: Self-pay | Admitting: Cardiovascular Disease

## 2016-10-24 NOTE — Telephone Encounter (Signed)
Received records from [facility] for appointment on 10/26/16 with Dr Berry.  Records put with Dr Berry's schedule for 10/26/16. lp °

## 2016-10-26 ENCOUNTER — Ambulatory Visit (INDEPENDENT_AMBULATORY_CARE_PROVIDER_SITE_OTHER): Payer: Medicare HMO | Admitting: Cardiovascular Disease

## 2016-10-26 ENCOUNTER — Encounter (INDEPENDENT_AMBULATORY_CARE_PROVIDER_SITE_OTHER): Payer: Self-pay

## 2016-10-26 ENCOUNTER — Encounter: Payer: Self-pay | Admitting: Cardiovascular Disease

## 2016-10-26 VITALS — BP 114/70 | HR 70 | Ht 63.0 in | Wt 160.2 lb

## 2016-10-26 DIAGNOSIS — R079 Chest pain, unspecified: Secondary | ICD-10-CM | POA: Diagnosis not present

## 2016-10-26 NOTE — Assessment & Plan Note (Signed)
History of hypertension with blood pressure measures 114/70. She is not on antihypertensive medications.

## 2016-10-26 NOTE — Assessment & Plan Note (Signed)
History of hyperlipidemia on statin therapy followed by her PCP. 

## 2016-10-26 NOTE — Progress Notes (Signed)
10/26/2016 Annette Norman   [birth year]  [id number]  Primary Physician Dolan Amen, MD Primary Cardiologist: Runell Gess MD Nicholes Calamity, [place]  HPI:  Annette Norman is a 51 y.o. female divorced, mother of 3 children who is disabled issues was referred by Arnette Felts PA-C for cardiac catheterization because of ongoing chest pain and recent negative stress test. She has a history of true hyper-lipidemia and diabetes. She has COPD but does not smoke. She's never had heart attack or stroke. She underwent cardiac catheterization in [facility] 2000 that was unrevealing and again by Dr. Garnette Scheuermann 02/05/15 revealed minimal CAD. She has normal LV function.   Current Meds  Medication Sig  . aspirin EC 81 MG tablet Take 81 mg by mouth daily.  Marland Kitchen BREO ELLIPTA 200-25 MCG/INH AEPB Inhale 1 puff into the lungs daily.   . brimonidine-timolol (COMBIGAN) 0.2-0.5 % ophthalmic solution Place 1 drop into the left eye 2 (two) times daily.  . Cholecalciferol (VITAMIN D3) 5000 UNITS CAPS Take 5,000 Units by mouth daily.   . CRESTOR 40 MG tablet Take 40 mg by mouth daily.  . diclofenac sodium (VOLTAREN) 1 % GEL Apply 2 g topically 4 (four) times daily.  . dorzolamide (TRUSOPT) 2 % ophthalmic solution Place 1 drop into the left eye 3 (three) times daily.  Marland Kitchen esomeprazole (NEXIUM) 40 MG capsule Take 40 mg by mouth 2 (two) times daily before a meal.  . FLUoxetine (PROZAC) 40 MG capsule Take 40 mg by mouth daily.  . folic acid (FOLVITE) 1 MG tablet Take 1 mg by mouth daily.  Marland Kitchen gabapentin (NEURONTIN) 300 MG capsule Take 300 mg by mouth 2 (two) times daily.  Marland Kitchen HYDROcodone-acetaminophen (NORCO) 10-325 MG tablet Take 1 tablet by mouth 3 (three) times daily.   . Insulin Detemir (LEVEMIR) 100 UNIT/ML Pen Inject 15 Units into the skin daily at 10 pm.  . insulin glargine (LANTUS) 100 UNIT/ML injection Inject 0.1 mLs (10 Units total) into the skin at bedtime.  . Insulin Pen Needle (CAREFINE PEN NEEDLES) 32G X 6  MM MISC Use daily to inject insulin as instructed  . LINZESS 145 MCG CAPS capsule Take 145 mcg by mouth daily.  Marland Kitchen loratadine (CLARITIN) 10 MG tablet Take 10 mg by mouth daily.  Marland Kitchen LYRICA 75 MG capsule Take 75 mg by mouth 2 (two) times daily.  . meloxicam (MOBIC) 15 MG tablet TAKE 1 TABLET BY MOUTH DAILY AS NEEDED FOR PAIN (Patient taking differently: Take 15 mg by mouth once a day as needed for pain)  . ondansetron (ZOFRAN) 4 MG tablet Take 4 mg by mouth every 8 (eight) hours as needed for nausea.  . pantoprazole (PROTONIX) 40 MG tablet Take 40 mg by mouth 2 (two) times daily.  . ranitidine (ZANTAC) 150 MG tablet Take 150 mg by mouth 2 (two) times daily.   . rizatriptan (MAXALT-MLT) 10 MG disintegrating tablet Take 1 tablet (10 mg total) by mouth as needed for migraine. May repeat in 2 hours if needed  . topiramate (TOPAMAX) 50 MG tablet Take 1-2 tablets (50-100 mg total) by mouth 2 (two) times daily. (Patient taking differently: Take 100 mg by mouth 2 (two) times daily. )  . Travoprost, BAK Free, (TRAVATAN Z) 0.004 % SOLN ophthalmic solution Place 1 drop into the left eye at bedtime.  . traZODone (DESYREL) 100 MG tablet Take 100 mg by mouth at bedtime as needed for sleep.   Marland Kitchen trimethoprim-polymyxin b (POLYTRIM) ophthalmic solution Place  1 drop into the left eye 2 (two) times daily.   . VENTOLIN HFA 108 (90 Base) MCG/ACT inhaler Inhale 1-2 puffs into the lungs every 6 (six) hours as needed for wheezing or shortness of breath.      Allergies  Allergen Reactions  . Bee Venom Anaphylaxis  . Amitriptyline Hives and Rash  . Nortriptyline Hives  . Norco [Hydrocodone-Acetaminophen] Itching and Other (See Comments)    Can also tolerate Percocet, but MUST "pre-medicate" with Benadryl  . Zithromax [Azithromycin] Hives    Social History   Social History  . Marital status: Divorced    Spouse name: N/A  . Number of children: 3  . Years of education: 9   Occupational History  .      unemployed     Social History Main Topics  . Smoking status: Never Smoker  . Smokeless tobacco: Never Used  . Alcohol use No  . Drug use: No  . Sexual activity: Not on file   Other Topics Concern  . Not on file   Social History Narrative   Lives at home alone   Caffeine use- coffee 3 cups daily     Review of Systems: General: negative for chills, fever, night sweats or weight changes.  Cardiovascular: negative for chest pain, dyspnea on exertion, edema, orthopnea, palpitations, paroxysmal nocturnal dyspnea or shortness of breath Dermatological: negative for rash Respiratory: negative for cough or wheezing Urologic: negative for hematuria Abdominal: negative for nausea, vomiting, diarrhea, bright red blood per rectum, melena, or hematemesis Neurologic: negative for visual changes, syncope, or dizziness All other systems reviewed and are otherwise negative except as noted above.    Blood pressure 114/70, pulse 70, height 5' 3" (1.6 m), weight 160 lb 3.2 oz (72.7 kg).  General appearance: alert and no distress Neck: no adenopathy, no carotid bruit, no JVD, supple, symmetrical, trachea midline and thyroid not enlarged, symmetric, no tenderness/mass/nodules Lungs: clear to auscultation bilaterally Heart: regular rate and rhythm, S1, S2 normal, no murmur, click, rub or gallop Extremities: extremities normal, atraumatic, no cyanosis or edema Pulses: 2+ and symmetric Skin: Skin color, texture, turgor normal. No rashes or lesions Neurologic: Alert and oriented X 3, normal strength and tone. Normal symmetric reflexes. Normal coordination and gait  EKG sinus rhythm at 70 without ST or T-wave changes. I personally reviewed this EKG.  ASSESSMENT AND PLAN:   Chest pain History of atypical chest pain for many years. She's had a normal cath at [place] 2001 more recently by Dr. Smith 02/05/15 revealing scattered mild CAD. She recently had a negative stress test. She continues to have constant chest  pain with shortness of breath and radiation to her arms. She was referred by Mike Duran for cardiac catheterization to further define her coronary anatomy.The patient understands that risks included but are not limited to stroke (1 in 1000), death (1 in 1000), kidney failure [usually temporary] (1 in 500), bleeding (1 in 200), allergic reaction [possibly serious] (1 in 200). The patient understands and agrees to proceed  HLD (hyperlipidemia) History of hyperlipidemia on statin therapy followed by her PCP  Benign essential HTN History of hypertension with blood pressure measures 114/70. She is not on antihypertensive medications.      Jonathan J. Berry MD FACP,FACC,FAHA, FSCAI 10/26/2016 1:18 PM 

## 2016-10-26 NOTE — Patient Instructions (Addendum)
   [facility] CARDIOVASCULAR DIVISION [facility] [address] [place] [address] Dept: [phone] Loc: [phone]  Annette Norman  10/26/2016  You are scheduled for a Cardiac Catheterization on Monday, October 15 with Dr. Nanetta Batty.  1. Please arrive at the [facility] (Main Entrance A) at [facility]: [address] [place], [place] [address] at 11:00 AM (two hours before your procedure to ensure your preparation). Free valet parking service is available.   Special note: Every effort is made to have your procedure done on time. Please understand that emergencies sometimes delay scheduled procedures.  2. Diet: Do not eat or drink anything after midnight prior to your procedure except sips of water to take medications.  3. Labs: Please have labs drawn in our office today.  4. Medication instructions in preparation for your procedure:  Take 1/2 dose of Levemir (7.5 units) the night before your procedure and none on the day of.  Do not take Invokana the day before or the day of your procedure.  On the morning of your procedure, take your  Aspirin and any morning medicines NOT listed above.  You may use sips of water.  5. Plan for one night stay--bring personal belongings. 6. Bring a current list of your medications and current insurance cards. 7. You MUST have a responsible person to drive you home. 8. Someone MUST be with you the first 24 hours after you arrive home or your discharge will be delayed. 9. Please wear clothes that are easy to get on and off and wear slip-on shoes.  Thank you for allowing [place] to care for you!   -- [facility]   Post-procedure Follow-up: Your physician recommends that you schedule a follow-up appointment 1-2 weeks after your procedure with Dr. Allyson Sabal.

## 2016-10-26 NOTE — Assessment & Plan Note (Signed)
History of atypical chest pain for many years. She's had a normal cath at [facility] 2001 more recently by Dr. Katrinka Blazing 02/05/15 revealing scattered mild CAD. She recently had a negative stress test. She continues to have constant chest pain with shortness of breath and radiation to her arms. She was referred by Arnette Felts for cardiac catheterization to further define her coronary anatomy.The patient understands that risks included but are not limited to stroke (1 in 1000), death (1 in 1000), kidney failure [usually temporary] (1 in 500), bleeding (1 in 200), allergic reaction [possibly serious] (1 in 200). The patient understands and agrees to proceed

## 2016-10-27 LAB — CBC WITH DIFFERENTIAL/PLATELET
BASOS: 1 %
Basophils Absolute: 0 10*3/uL (ref 0.0–0.2)
EOS (ABSOLUTE): 0.1 10*3/uL (ref 0.0–0.4)
EOS: 1 %
HEMATOCRIT: 39.2 % (ref 34.0–46.6)
Hemoglobin: 12.6 g/dL (ref 11.1–15.9)
IMMATURE GRANS (ABS): 0.1 10*3/uL (ref 0.0–0.1)
IMMATURE GRANULOCYTES: 1 %
LYMPHS: 38 %
Lymphocytes Absolute: 2.4 10*3/uL (ref 0.7–3.1)
MCH: 25.8 pg — ABNORMAL LOW (ref 26.6–33.0)
MCHC: 32.1 g/dL (ref 31.5–35.7)
MCV: 80 fL (ref 79–97)
MONOCYTES: 10 %
Monocytes Absolute: 0.7 10*3/uL (ref 0.1–0.9)
NEUTROS PCT: 49 %
Neutrophils Absolute: 3.1 10*3/uL (ref 1.4–7.0)
PLATELETS: 244 10*3/uL (ref 150–379)
RBC: 4.88 x10E6/uL (ref 3.77–5.28)
RDW: 14.7 % (ref 12.3–15.4)
WBC: 6.4 10*3/uL (ref 3.4–10.8)

## 2016-10-27 LAB — BASIC METABOLIC PANEL
BUN/Creatinine Ratio: 14 (ref 9–23)
BUN: 12 mg/dL (ref 6–24)
CALCIUM: 9.5 mg/dL (ref 8.7–10.2)
CO2: 23 mmol/L (ref 20–29)
CREATININE: 0.83 mg/dL (ref 0.57–1.00)
Chloride: 105 mmol/L (ref 96–106)
GFR calc Af Amer: 93 mL/min/{1.73_m2} (ref >59)
GFR, EST NON AFRICAN AMERICAN: 81 mL/min/{1.73_m2} (ref >59)
Glucose: 116 mg/dL — ABNORMAL HIGH (ref 65–99)
Potassium: 4.1 mmol/L (ref 3.5–5.2)
Sodium: 143 mmol/L (ref 134–144)

## 2016-10-27 LAB — PROTIME-INR
INR: 1 (ref 0.8–1.2)
PROTHROMBIN TIME: 10 s (ref 9.1–12.0)

## 2016-10-27 LAB — APTT: aPTT: 31 s (ref 24–33)

## 2016-10-27 LAB — TSH: TSH: 1.13 u[IU]/mL (ref 0.450–4.500)

## 2016-11-03 ENCOUNTER — Telehealth: Payer: Self-pay

## 2016-11-03 NOTE — Telephone Encounter (Signed)
Patient contacted pre-catheterization at [facility] scheduled for:  11/07/2016 @ 1300 Verified arrival time and place:  NT @ 1100 Confirmed AM meds to be taken pre-cath with sip of water: Take ASA Night before procedure take 1/2 levemir Hold INvokamet 24 hours prior to procedure and 48 hours after Confirmed patient has responsible person to drive home post procedure and observe patient for 24 hours:  yes Addl concerns:  none

## 2016-11-04 ENCOUNTER — Other Ambulatory Visit: Payer: Self-pay | Admitting: Cardiovascular Disease

## 2016-11-07 ENCOUNTER — Ambulatory Visit (HOSPITAL_COMMUNITY)
Admission: RE | Disposition: A | Discharge status: Home / Self Care | Payer: Self-pay | Source: Ambulatory Visit | Attending: Cardiovascular Disease

## 2016-11-07 ENCOUNTER — Ambulatory Visit (HOSPITAL_COMMUNITY)
Admission: RE | Admit: 2016-11-07 | Discharge: 2016-11-07 | Disposition: A | Discharge status: Home / Self Care | Payer: Medicare HMO | Source: Ambulatory Visit | Attending: Cardiovascular Disease | Admitting: Cardiovascular Disease

## 2016-11-07 DIAGNOSIS — I2510 Atherosclerotic heart disease of native coronary artery without angina pectoris: Secondary | ICD-10-CM | POA: Diagnosis not present

## 2016-11-07 DIAGNOSIS — E119 Type 2 diabetes mellitus without complications: Secondary | ICD-10-CM | POA: Insufficient documentation

## 2016-11-07 DIAGNOSIS — Z794 Long term (current) use of insulin: Secondary | ICD-10-CM | POA: Insufficient documentation

## 2016-11-07 DIAGNOSIS — I10 Essential (primary) hypertension: Secondary | ICD-10-CM | POA: Diagnosis not present

## 2016-11-07 DIAGNOSIS — E785 Hyperlipidemia, unspecified: Secondary | ICD-10-CM | POA: Insufficient documentation

## 2016-11-07 DIAGNOSIS — Z7982 Long term (current) use of aspirin: Secondary | ICD-10-CM | POA: Insufficient documentation

## 2016-11-07 DIAGNOSIS — R0789 Other chest pain: Secondary | ICD-10-CM | POA: Diagnosis present

## 2016-11-07 DIAGNOSIS — J449 Chronic obstructive pulmonary disease, unspecified: Secondary | ICD-10-CM | POA: Insufficient documentation

## 2016-11-07 HISTORY — PX: LEFT HEART CATH AND CORONARY ANGIOGRAPHY: CATH118249

## 2016-11-07 LAB — GLUCOSE, CAPILLARY
Glucose-Capillary: 129 mg/dL — ABNORMAL HIGH (ref 65–99)
Glucose-Capillary: 101 mg/dL — ABNORMAL HIGH (ref 65–99)

## 2016-11-07 SURGERY — LEFT HEART CATH AND CORONARY ANGIOGRAPHY
Anesthesia: LOCAL

## 2016-11-07 MED ORDER — SODIUM CHLORIDE 0.9% FLUSH: 3.0000 mL | Freq: Two times a day (BID) | INTRAVENOUS | Status: DC

## 2016-11-07 MED ORDER — VERAPAMIL HCL 2.5 MG/ML IV SOLN
INTRA_ARTERIAL | Status: DC | PRN
  Administered 2016-11-07: 7.5 mL via INTRA_ARTERIAL

## 2016-11-07 MED ORDER — LIDOCAINE HCL 2 % IJ SOLN
INTRAMUSCULAR | Status: AC
  Filled 2016-11-07: qty 10

## 2016-11-07 MED ORDER — SODIUM CHLORIDE 0.9% FLUSH: 3.0000 mL | INTRAVENOUS | Status: DC | PRN

## 2016-11-07 MED ORDER — HEPARIN (PORCINE) IN NACL 2-0.9 UNIT/ML-% IJ SOLN
INTRAMUSCULAR | Status: AC
  Filled 2016-11-07: qty 500

## 2016-11-07 MED ORDER — ASPIRIN 81 MG PO CHEW: 81.0000 mg | CHEWABLE_TABLET | ORAL | Status: DC

## 2016-11-07 MED ORDER — MIDAZOLAM HCL 2 MG/2ML IJ SOLN
INTRAMUSCULAR | Status: DC | PRN
Start: 2016-11-07 — End: 2016-11-07
  Administered 2016-11-07 (×2): 1 mg via INTRAVENOUS

## 2016-11-07 MED ORDER — IOPAMIDOL (ISOVUE-370) INJECTION 76%
INTRAVENOUS | Status: DC | PRN
  Administered 2016-11-07: 30 mL via INTRA_ARTERIAL

## 2016-11-07 MED ORDER — HEPARIN (PORCINE) IN NACL 2-0.9 UNIT/ML-% IJ SOLN
INTRAMUSCULAR | Status: AC | PRN
  Administered 2016-11-07: 1000 mL

## 2016-11-07 MED ORDER — VERAPAMIL HCL 2.5 MG/ML IV SOLN
INTRAVENOUS | Status: AC
  Filled 2016-11-07: qty 2

## 2016-11-07 MED ORDER — LIDOCAINE HCL 2 % IJ SOLN
INTRAMUSCULAR | Status: DC | PRN
  Administered 2016-11-07: 2 mL via INTRADERMAL

## 2016-11-07 MED ORDER — SODIUM CHLORIDE 0.9 % IV SOLN: INTRAVENOUS | Status: DC

## 2016-11-07 MED ORDER — SODIUM CHLORIDE 0.9 % WEIGHT BASED INFUSION: 1.0000 mL/kg/h | INTRAVENOUS | Status: DC

## 2016-11-07 MED ORDER — SODIUM CHLORIDE 0.9 % IV SOLN: 250.0000 mL | INTRAVENOUS | Status: DC | PRN

## 2016-11-07 MED ORDER — HEPARIN SODIUM (PORCINE) 1000 UNIT/ML IJ SOLN
INTRAMUSCULAR | Status: DC | PRN
Start: 2016-11-07 — End: 2016-11-07
  Administered 2016-11-07: 3500 [IU] via INTRAVENOUS

## 2016-11-07 MED ORDER — IOPAMIDOL (ISOVUE-370) INJECTION 76%
INTRAVENOUS | Status: AC
  Filled 2016-11-07: qty 100

## 2016-11-07 MED ORDER — HEPARIN SODIUM (PORCINE) 1000 UNIT/ML IJ SOLN
INTRAMUSCULAR | Status: AC
  Filled 2016-11-07: qty 1

## 2016-11-07 MED ORDER — FENTANYL CITRATE (PF) 100 MCG/2ML IJ SOLN
INTRAMUSCULAR | Status: DC | PRN
  Administered 2016-11-07 (×2): 25 ug via INTRAVENOUS

## 2016-11-07 MED ORDER — ONDANSETRON HCL 4 MG/2ML IJ SOLN: 4.0000 mg | Freq: Four times a day (QID) | INTRAMUSCULAR | Status: DC | PRN

## 2016-11-07 MED ORDER — SODIUM CHLORIDE 0.9 % WEIGHT BASED INFUSION
3.0000 mL/kg/h | INTRAVENOUS | Status: AC
  Administered 2016-11-07: 3 mL/kg/h via INTRAVENOUS

## 2016-11-07 MED ORDER — ASPIRIN 81 MG PO CHEW: 81.0000 mg | CHEWABLE_TABLET | Freq: Every day | ORAL | Status: DC

## 2016-11-07 MED ORDER — FENTANYL CITRATE (PF) 100 MCG/2ML IJ SOLN
INTRAMUSCULAR | Status: AC
  Filled 2016-11-07: qty 2

## 2016-11-07 MED ORDER — NITROGLYCERIN 1 MG/10 ML FOR IR/CATH LAB
INTRA_ARTERIAL | Status: AC
  Filled 2016-11-07: qty 10

## 2016-11-07 MED ORDER — ACETAMINOPHEN 325 MG PO TABS: 650.0000 mg | ORAL_TABLET | ORAL | Status: DC | PRN

## 2016-11-07 MED ORDER — MIDAZOLAM HCL 2 MG/2ML IJ SOLN
INTRAMUSCULAR | Status: AC
  Filled 2016-11-07: qty 2

## 2016-11-07 SURGICAL SUPPLY — 12 items
CATH INFINITI 5 FR JL3.5 (CATHETERS) IMPLANT
CATH INFINITI 5FR ANG PIGTAIL (CATHETERS) ×1 IMPLANT
CATH OPTITORQUE TIG 4.0 5F (CATHETERS) ×1 IMPLANT
DEVICE RAD COMP TR BAND LRG (VASCULAR PRODUCTS) ×1 IMPLANT
GLIDESHEATH SLEND A-KIT 6F 22G (SHEATH) ×1 IMPLANT
GUIDEWIRE INQWIRE 1.5J.035X260 (WIRE) IMPLANT
INQWIRE 1.5J .035X260CM (WIRE) ×2
KIT HEART LEFT (KITS) ×2 IMPLANT
PACK CARDIAC CATHETERIZATION (CUSTOM PROCEDURE TRAY) ×2 IMPLANT
TRANSDUCER W/STOPCOCK (MISCELLANEOUS) ×2 IMPLANT
TUBING CIL FLEX 10 FLL-RA (TUBING) ×2 IMPLANT
WIRE HI TORQ VERSACORE-J 145CM (WIRE) ×1 IMPLANT

## 2016-11-07 NOTE — Interval H&P Note (Signed)
Cath Lab Visit (complete for each Cath Lab visit)  Clinical Evaluation Leading to the Procedure:   ACS: No.  Non-ACS:    Anginal Classification: CCS II  Anti-ischemic medical therapy: Maximal Therapy (2 or more classes of medications)  Non-Invasive Test Results: No non-invasive testing performed  Prior CABG: No previous CABG      History and Physical Interval Note:  11/07/2016 3:12 PM  Annette Norman  has presented today for surgery, with the diagnosis of cp  The various methods of treatment have been discussed with the patient and family. After consideration of risks, benefits and other options for treatment, the patient has consented to  Procedure(s): LEFT HEART CATH AND CORONARY ANGIOGRAPHY (N/A) as a surgical intervention .  The patient's history has been reviewed, patient examined, no change in status, stable for surgery.  I have reviewed the patient's chart and labs.  Questions were answered to the patient's satisfaction.     Nanetta Batty

## 2016-11-07 NOTE — Discharge Instructions (Signed)
Radial Site Care Refer to this sheet in the next few weeks. These instructions provide you with information about caring for yourself after your procedure. Your health care provider may also give you more specific instructions. Your treatment has been planned according to current medical practices, but problems sometimes occur. Call your health care provider if you have any problems or questions after your procedure. What can I expect after the procedure? After your procedure, it is typical to have the following:  Bruising at the radial site that usually fades within 1-2 weeks.  Blood collecting in the tissue (hematoma) that may be painful to the touch. It should usually decrease in size and tenderness within 1-2 weeks.  Follow these instructions at home:  Take medicines only as directed by your health care provider.  You may shower 24-48 hours after the procedure or as directed by your health care provider. Remove the bandage (dressing) and gently wash the site with plain soap and water. Pat the area dry with a clean towel. Do not rub the site, because this may cause bleeding.  Do not take baths, swim, or use a hot tub until your health care provider approves.  Check your insertion site every day for redness, swelling, or drainage.  Do not apply powder or lotion to the site.  Do not flex or bend the affected arm for 24 hours or as directed by your health care provider.  Do not push or pull heavy objects with the affected arm for 24 hours or as directed by your health care provider.  Do not lift over 10 lb (4.5 kg) for 5 days after your procedure or as directed by your health care provider.  Ask your health care provider when it is okay to: ? Return to work or school. ? Resume usual physical activities or sports. ? Resume sexual activity.  Do not drive home if you are discharged the same day as the procedure. Have someone else drive you.  You may drive 24 hours after the procedure  unless otherwise instructed by your health care provider.  Do not operate machinery or power tools for 24 hours after the procedure.  If your procedure was done as an outpatient procedure, which means that you went home the same day as your procedure, a responsible adult should be with you for the first 24 hours after you arrive home.  Keep all follow-up visits as directed by your health care provider. This is important. Contact a health care provider if:  You have a fever.  You have chills.  You have increased bleeding from the radial site. Hold pressure on the site. Get help right away if:  You have unusual pain at the radial site.  You have redness, warmth, or swelling at the radial site.  You have drainage (other than a small amount of blood on the dressing) from the radial site.  The radial site is bleeding, and the bleeding does not stop after 30 minutes of holding steady pressure on the site.  Your arm or hand becomes pale, cool, tingly, or numb. This information is not intended to replace advice given to you by your health care provider. Make sure you discuss any questions you have with your health care provider. Document Released: 02/12/2010 Document Revised: 06/18/2015 Document Reviewed: 07/29/2013 Elsevier Interactive Patient Education  2018 [employer].            Excuse from Work, [employer], or Physical Activity Annette Norman needs to be excused from: __X__  Work ____ [employer] ____ Physical activity beginning now and through the following date: 11/09/16. He was at the hospital with his fiancee for a medical procedure. She required a resonsible adult to remain with her after discharge. Health Care Provider Name (printed): Dr. Nanetta Batty Date: 11/07/16  This information is not intended to replace advice given to you by your health care provider. Make sure you discuss any questions you have with your health care provider. Document Released: 07/06/2000  Document Revised: 12/25/2015 Document Reviewed: 08/12/2013 Elsevier Interactive Patient Education  [name].

## 2016-11-07 NOTE — H&P (View-Only) (Signed)
10/26/2016 Annette Norman   [birth year]  [id number]  Primary Physician Dolan Amen, MD Primary Cardiologist: Runell Gess MD Nicholes Calamity, [place]  HPI:  Annette Norman is a 51 y.o. female divorced, mother of 3 children who is disabled issues was referred by Arnette Felts PA-C for cardiac catheterization because of ongoing chest pain and recent negative stress test. She has a history of true hyper-lipidemia and diabetes. She has COPD but does not smoke. She's never had heart attack or stroke. She underwent cardiac catheterization in [facility] 2000 that was unrevealing and again by Dr. Garnette Scheuermann 02/05/15 revealed minimal CAD. She has normal LV function.   Current Meds  Medication Sig  . aspirin EC 81 MG tablet Take 81 mg by mouth daily.  Marland Kitchen BREO ELLIPTA 200-25 MCG/INH AEPB Inhale 1 puff into the lungs daily.   . brimonidine-timolol (COMBIGAN) 0.2-0.5 % ophthalmic solution Place 1 drop into the left eye 2 (two) times daily.  . Cholecalciferol (VITAMIN D3) 5000 UNITS CAPS Take 5,000 Units by mouth daily.   . CRESTOR 40 MG tablet Take 40 mg by mouth daily.  . diclofenac sodium (VOLTAREN) 1 % GEL Apply 2 g topically 4 (four) times daily.  . dorzolamide (TRUSOPT) 2 % ophthalmic solution Place 1 drop into the left eye 3 (three) times daily.  Marland Kitchen esomeprazole (NEXIUM) 40 MG capsule Take 40 mg by mouth 2 (two) times daily before a meal.  . FLUoxetine (PROZAC) 40 MG capsule Take 40 mg by mouth daily.  . folic acid (FOLVITE) 1 MG tablet Take 1 mg by mouth daily.  Marland Kitchen gabapentin (NEURONTIN) 300 MG capsule Take 300 mg by mouth 2 (two) times daily.  Marland Kitchen HYDROcodone-acetaminophen (NORCO) 10-325 MG tablet Take 1 tablet by mouth 3 (three) times daily.   . Insulin Detemir (LEVEMIR) 100 UNIT/ML Pen Inject 15 Units into the skin daily at 10 pm.  . insulin glargine (LANTUS) 100 UNIT/ML injection Inject 0.1 mLs (10 Units total) into the skin at bedtime.  . Insulin Pen Needle (CAREFINE PEN NEEDLES) 32G X 6  MM MISC Use daily to inject insulin as instructed  . LINZESS 145 MCG CAPS capsule Take 145 mcg by mouth daily.  Marland Kitchen loratadine (CLARITIN) 10 MG tablet Take 10 mg by mouth daily.  Marland Kitchen LYRICA 75 MG capsule Take 75 mg by mouth 2 (two) times daily.  . meloxicam (MOBIC) 15 MG tablet TAKE 1 TABLET BY MOUTH DAILY AS NEEDED FOR PAIN (Patient taking differently: Take 15 mg by mouth once a day as needed for pain)  . ondansetron (ZOFRAN) 4 MG tablet Take 4 mg by mouth every 8 (eight) hours as needed for nausea.  . pantoprazole (PROTONIX) 40 MG tablet Take 40 mg by mouth 2 (two) times daily.  . ranitidine (ZANTAC) 150 MG tablet Take 150 mg by mouth 2 (two) times daily.   . rizatriptan (MAXALT-MLT) 10 MG disintegrating tablet Take 1 tablet (10 mg total) by mouth as needed for migraine. May repeat in 2 hours if needed  . topiramate (TOPAMAX) 50 MG tablet Take 1-2 tablets (50-100 mg total) by mouth 2 (two) times daily. (Patient taking differently: Take 100 mg by mouth 2 (two) times daily. )  . Travoprost, BAK Free, (TRAVATAN Z) 0.004 % SOLN ophthalmic solution Place 1 drop into the left eye at bedtime.  . traZODone (DESYREL) 100 MG tablet Take 100 mg by mouth at bedtime as needed for sleep.   Marland Kitchen trimethoprim-polymyxin b (POLYTRIM) ophthalmic solution Place  1 drop into the left eye 2 (two) times daily.   . VENTOLIN HFA 108 (90 Base) MCG/ACT inhaler Inhale 1-2 puffs into the lungs every 6 (six) hours as needed for wheezing or shortness of breath.      Allergies  Allergen Reactions  . Bee Venom Anaphylaxis  . Amitriptyline Hives and Rash  . Nortriptyline Hives  . Norco [Hydrocodone-Acetaminophen] Itching and Other (See Comments)    Can also tolerate Percocet, but MUST "pre-medicate" with Benadryl  . Zithromax [Azithromycin] Hives    Social History   Social History  . Marital status: Divorced    Spouse name: N/A  . Number of children: 3  . Years of education: 9   Occupational History  .      unemployed     Social History Main Topics  . Smoking status: Never Smoker  . Smokeless tobacco: Never Used  . Alcohol use No  . Drug use: No  . Sexual activity: Not on file   Other Topics Concern  . Not on file   Social History Narrative   Lives at home alone   Caffeine use- coffee 3 cups daily     Review of Systems: General: negative for chills, fever, night sweats or weight changes.  Cardiovascular: negative for chest pain, dyspnea on exertion, edema, orthopnea, palpitations, paroxysmal nocturnal dyspnea or shortness of breath Dermatological: negative for rash Respiratory: negative for cough or wheezing Urologic: negative for hematuria Abdominal: negative for nausea, vomiting, diarrhea, bright red blood per rectum, melena, or hematemesis Neurologic: negative for visual changes, syncope, or dizziness All other systems reviewed and are otherwise negative except as noted above.    Blood pressure 114/70, pulse 70, height  (1.6 m), weight 160 lb 3.2 oz (72.7 kg).  General appearance: alert and no distress Neck: no adenopathy, no carotid bruit, no JVD, supple, symmetrical, trachea midline and thyroid not enlarged, symmetric, no tenderness/mass/nodules Lungs: clear to auscultation bilaterally Heart: regular rate and rhythm, S1, S2 normal, no murmur, click, rub or gallop Extremities: extremities normal, atraumatic, no cyanosis or edema Pulses: 2+ and symmetric Skin: Skin color, texture, turgor normal. No rashes or lesions Neurologic: Alert and oriented X 3, normal strength and tone. Normal symmetric reflexes. Normal coordination and gait  EKG sinus rhythm at 70 without ST or T-wave changes. I personally reviewed this EKG.  ASSESSMENT AND PLAN:   Chest pain History of atypical chest pain for many years. She's had a normal cath at [facility] 2001 more recently by Dr. Katrinka Blazing 02/05/15 revealing scattered mild CAD. She recently had a negative stress test. She continues to have constant chest  pain with shortness of breath and radiation to her arms. She was referred by Arnette Felts for cardiac catheterization to further define her coronary anatomy.The patient understands that risks included but are not limited to stroke (1 in 1000), death (1 in 1000), kidney failure [usually temporary] (1 in 500), bleeding (1 in 200), allergic reaction [possibly serious] (1 in 200). The patient understands and agrees to proceed  HLD (hyperlipidemia) History of hyperlipidemia on statin therapy followed by her PCP  Benign essential HTN History of hypertension with blood pressure measures 114/70. She is not on antihypertensive medications.      Runell Gess MD FACP,FACC,FAHA, [facility] 10/26/2016 1:18 PM

## 2016-11-08 ENCOUNTER — Encounter (HOSPITAL_COMMUNITY): Payer: Self-pay | Admitting: Cardiovascular Disease

## 2016-11-08 MED FILL — Lidocaine HCl Local Inj 2%: INTRAMUSCULAR | Qty: 10 | Status: AC

## 2016-11-22 ENCOUNTER — Ambulatory Visit (INDEPENDENT_AMBULATORY_CARE_PROVIDER_SITE_OTHER): Payer: Medicare HMO | Admitting: Cardiovascular Disease

## 2016-11-22 ENCOUNTER — Encounter: Payer: Self-pay | Admitting: Cardiovascular Disease

## 2016-11-22 DIAGNOSIS — I208 Other forms of angina pectoris: Secondary | ICD-10-CM

## 2016-11-22 NOTE — Patient Instructions (Signed)
Medication Instructions: Your physician recommends that you continue on your current medications as directed. Please refer to the Current Medication list given to you today.   Follow-Up: Your physician recommends that you schedule a follow-up appointment as needed with Dr. Berry.    

## 2016-11-22 NOTE — Assessment & Plan Note (Signed)
Ms. Annette Norman returns today for her first post hospital visit after I performed outpatient coronary angiography on her 11/07/16. She was referred to me by Arnette FeltsMike Duran [facility] for ongoing chest pain despite a negative Myoview stress test which was performed in his office 09/06/16. She had scattered noncritical CAD with normal LV function. I reassured her that her chest pain is noncardiac and I will see her back as needed. Her right radial puncture site is well-healed.

## 2016-11-22 NOTE — Progress Notes (Signed)
Ms. Annette Norman returns today for her first post hospital visit after I performed outpatient coronary angiography on her 11/07/16. She was referred to me by Arnette [facility]Mike Duran [facility] ongoing [facility] pain despite a negative Myoview stress test which was performed in his office 09/06/16. She had scattered noncritical CAD with normal LV function. I reassured her that her chest pain is noncardiac and I will see her back as needed. Her right radial puncture site is well-healed.  Runell GessJonathan J. Vivian Neuwirth, M.D., FACP, [facility], Earl LagosFAHA, [facility] [address] [place], [place]  [id number]  [id number] 11/22/2016 10:41 AM

## 2016-11-30 ENCOUNTER — Encounter: Payer: Self-pay | Admitting: Pulmonary Disease

## 2016-11-30 ENCOUNTER — Ambulatory Visit (INDEPENDENT_AMBULATORY_CARE_PROVIDER_SITE_OTHER): Payer: Medicare HMO | Admitting: Pulmonary Disease

## 2016-11-30 VITALS — BP 122/70 | HR 89 | Ht 64.0 in | Wt 167.0 lb

## 2016-11-30 DIAGNOSIS — M79604 Pain in right leg: Secondary | ICD-10-CM | POA: Diagnosis not present

## 2016-11-30 DIAGNOSIS — R0600 Dyspnea, unspecified: Secondary | ICD-10-CM

## 2016-11-30 NOTE — Patient Instructions (Signed)
Shortness of breath: We will check your oxygen level while walking today We will check a lung function test Depending on the results of the lung function test we may need to perform another procedure, though we will wait to see the results of this test first  For leg claudication (pain): Keep the appointment for the ultrasound test of your leg  Follow-up with us after you have had the leg test
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
## 2016-12-08 ENCOUNTER — Ambulatory Visit (INDEPENDENT_AMBULATORY_CARE_PROVIDER_SITE_OTHER): Payer: Medicare HMO | Admitting: Pulmonary Disease

## 2016-12-08 DIAGNOSIS — R0600 Dyspnea, unspecified: Secondary | ICD-10-CM

## 2016-12-08 LAB — PULMONARY FUNCTION TEST
DL/VA % pred: 96 %
DL/VA: 4.73 ml/min/mmHg/L
DLCO UNC: 17.69 ml/min/mmHg
DLCO cor % pred: 71 %
DLCO cor: 18.28 ml/min/mmHg
DLCO unc % pred: 69 %
FEF 25-75 POST: 3.69 L/s
FEF 25-75 PRE: 3.64 L/s
FEF2575-%Change-Post: 1 %
FEF2575-%PRED-PRE: 136 %
FEF2575-%Pred-Post: 138 %
FEV1-%Change-Post: 0 %
FEV1-%PRED-POST: 89 %
FEV1-%PRED-PRE: 89 %
FEV1-PRE: 2.51 L
FEV1-Post: 2.52 L
FEV1FVC-%Change-Post: 3 %
FEV1FVC-%Pred-Pre: 109 %
FEV6-%CHANGE-POST: -3 %
FEV6-%PRED-POST: 81 %
FEV6-%PRED-PRE: 83 %
FEV6-PRE: 2.92 L
FEV6-Post: 2.82 L
FEV6FVC-%PRED-PRE: 103 %
FEV6FVC-%Pred-Post: 103 %
FVC-%CHANGE-POST: -3 %
FVC-%Pred-Post: 78 %
FVC-%Pred-Pre: 81 %
FVC-POST: 2.82 L
FVC-Pre: 2.92 L
POST FEV6/FVC RATIO: 100 %
PRE FEV1/FVC RATIO: 86 %
Post FEV1/FVC ratio: 89 %
Pre FEV6/FVC Ratio: 100 %
RV % PRED: 70 %
RV: 1.34 L
TLC % pred: 81 %
TLC: 4.25 L

## 2016-12-08 NOTE — Progress Notes (Signed)
PFT completed today 12/08/16.  

## 2016-12-22 ENCOUNTER — Ambulatory Visit: Payer: Medicare HMO | Admitting: Pulmonary Disease

## 2016-12-26 ENCOUNTER — Ambulatory Visit (INDEPENDENT_AMBULATORY_CARE_PROVIDER_SITE_OTHER): Payer: Medicare HMO | Admitting: Diagnostic Neuroimaging

## 2016-12-26 ENCOUNTER — Encounter: Payer: Self-pay | Admitting: Diagnostic Neuroimaging

## 2016-12-26 VITALS — BP 104/65 | HR 77

## 2016-12-26 DIAGNOSIS — R42 Dizziness and giddiness: Secondary | ICD-10-CM | POA: Diagnosis not present

## 2016-12-26 DIAGNOSIS — F5105 Insomnia due to other mental disorder: Secondary | ICD-10-CM | POA: Diagnosis not present

## 2016-12-26 DIAGNOSIS — M542 Cervicalgia: Secondary | ICD-10-CM | POA: Diagnosis not present

## 2016-12-26 DIAGNOSIS — G43009 Migraine without aura, not intractable, without status migrainosus: Secondary | ICD-10-CM | POA: Diagnosis not present

## 2016-12-26 DIAGNOSIS — F411 Generalized anxiety disorder: Secondary | ICD-10-CM

## 2016-12-26 DIAGNOSIS — F99 Mental disorder, not otherwise specified: Secondary | ICD-10-CM

## 2016-12-26 MED ORDER — GALCANEZUMAB-GNLM 120 MG/ML ~~LOC~~ SOAJ: 120.0000 mg | SUBCUTANEOUS | 4 refills | Status: DC

## 2016-12-26 MED ORDER — RIZATRIPTAN BENZOATE 10 MG PO TBDP: 10.0000 mg | ORAL_TABLET | ORAL | 11 refills | Status: DC | PRN

## 2016-12-26 MED ORDER — TOPIRAMATE 100 MG PO TABS: 100.0000 mg | ORAL_TABLET | Freq: Two times a day (BID) | ORAL | 4 refills | Status: DC
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
## 2016-12-26 NOTE — Patient Instructions (Signed)
MIGRAINE WITHOUT AURA - continue topiramate 100mg  twice a day - use rizatriptan as needed for migraine rescue - start CGRP antagonist (emgality) --> Take 240mg  injection loading dose 1st month; then 120mg  injection monthly dose   NECK PAIN - try increasing physical activity / physical therapy / water therapy  ANXIETY - continue anxiety treatments (psychiatry in [place])

## 2016-12-28 ENCOUNTER — Ambulatory Visit (INDEPENDENT_AMBULATORY_CARE_PROVIDER_SITE_OTHER): Payer: Medicare HMO | Admitting: Pulmonary Disease

## 2016-12-28 ENCOUNTER — Encounter: Payer: Self-pay | Admitting: Pulmonary Disease

## 2016-12-28 VITALS — BP 124/78 | HR 81 | Ht 64.0 in | Wt 167.0 lb

## 2016-12-28 DIAGNOSIS — R0600 Dyspnea, unspecified: Secondary | ICD-10-CM

## 2016-12-28 DIAGNOSIS — R0602 Shortness of breath: Secondary | ICD-10-CM | POA: Diagnosis not present

## 2016-12-28 DIAGNOSIS — R942 Abnormal results of pulmonary function studies: Secondary | ICD-10-CM | POA: Diagnosis not present

## 2016-12-28 NOTE — Patient Instructions (Signed)
Shortness of breath with an abnormal lung function test: Because there is a slight degree of diffusion abnormality (difficulty for oxygen to travel from the lung into the bloodstream) we will arrange for a CT scan of your chest to make sure there is no evidence of emphysema or scarring in your lung We will also try to get a copy of the echocardiogram [facility] If these tests are normal then my advice to you is to exercise more frequently If no improvement with exercise then we can consider a cardiopulmonary exercise test  Peripheral vascular disease: I recommend that you continue to follow-up with the vascular team to assess this problem further  We will see you back in 3 months or sooner if needed
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
## 2017-01-04 ENCOUNTER — Telehealth: Payer: Self-pay | Admitting: *Deleted

## 2017-01-04 NOTE — Telephone Encounter (Addendum)
PA started on CMM for [employer].  If [employer] has not replied within 24-72 hours please contact [employer] at [phone].

## 2017-01-06 ENCOUNTER — Ambulatory Visit (INDEPENDENT_AMBULATORY_CARE_PROVIDER_SITE_OTHER)
Admission: RE | Admit: 2017-01-06 | Discharge: 2017-01-06 | Disposition: A | Discharge status: Home / Self Care | Payer: Medicare HMO | Source: Ambulatory Visit | Attending: Pulmonary Disease | Admitting: Pulmonary Disease

## 2017-01-06 DIAGNOSIS — R0602 Shortness of breath: Secondary | ICD-10-CM

## 2017-01-09 ENCOUNTER — Encounter: Payer: Self-pay | Admitting: *Deleted

## 2017-01-10 NOTE — Telephone Encounter (Signed)
Emgality not approved. Successfully faxed appeal letter to [facility] [employer] appeals dept today.

## 2017-01-11 ENCOUNTER — Telehealth: Payer: Self-pay | Admitting: Pulmonary Disease

## 2017-01-11 DIAGNOSIS — R911 Solitary pulmonary nodule: Secondary | ICD-10-CM

## 2017-01-11 DIAGNOSIS — J439 Emphysema, unspecified: Secondary | ICD-10-CM

## 2017-01-11 NOTE — Telephone Encounter (Signed)
Spoke with pt. I have relayed her results to her again. Pt is very upset and concerned. She would like to speak to BQ.  BQ - please advise. Thanks.

## 2017-01-12 ENCOUNTER — Encounter: Payer: Self-pay | Admitting: *Deleted

## 2017-01-12 NOTE — Telephone Encounter (Signed)
Received fax from [facility] requesting additional information for [place] [place]. Letter composed, signed and successfully faxed to [facility] [employer] dept.

## 2017-01-12 NOTE — Telephone Encounter (Signed)
Very mild centrilobular emphysema, one small pulmonary nodule which at this time appears to be benign.  Needs follow-up CT in 1 year, please order.

## 2017-01-12 NOTE — Telephone Encounter (Signed)
Spoke with pt. She is aware of BQ's response. Order has been placed for follow up CT. Nothing further was needed.

## 2017-01-18 NOTE — Telephone Encounter (Signed)
Received response from [facility]. [employer] is APPROVED from 01/13/17-01/23/18.

## 2017-01-18 NOTE — Telephone Encounter (Signed)
Ref # for approval is [id number]

## 2017-01-24 HISTORY — PX: STOMACH SURGERY: SHX791

## 2017-01-25 ENCOUNTER — Other Ambulatory Visit: Payer: Self-pay | Admitting: Cardiology

## 2017-01-25 DIAGNOSIS — Z1231 Encounter for screening mammogram for malignant neoplasm of breast: Secondary | ICD-10-CM

## 2017-02-12 ENCOUNTER — Other Ambulatory Visit (INDEPENDENT_AMBULATORY_CARE_PROVIDER_SITE_OTHER): Payer: Self-pay | Admitting: Orthopaedic Surgery

## 2017-02-15 ENCOUNTER — Ambulatory Visit: Payer: Medicare HMO

## 2017-03-08 ENCOUNTER — Ambulatory Visit
Admission: RE | Admit: 2017-03-08 | Discharge: 2017-03-08 | Disposition: A | Discharge status: Home / Self Care | Payer: Medicare HMO | Source: Ambulatory Visit | Attending: Cardiology | Admitting: Cardiology

## 2017-03-08 DIAGNOSIS — Z1231 Encounter for screening mammogram for malignant neoplasm of breast: Secondary | ICD-10-CM

## 2017-04-07 ENCOUNTER — Encounter: Payer: Self-pay | Admitting: Pulmonary Disease

## 2017-04-07 ENCOUNTER — Other Ambulatory Visit: Payer: Medicare HMO

## 2017-04-07 ENCOUNTER — Ambulatory Visit (INDEPENDENT_AMBULATORY_CARE_PROVIDER_SITE_OTHER): Payer: Medicare HMO | Admitting: Pulmonary Disease

## 2017-04-07 VITALS — BP 130/66 | HR 72 | Ht 64.0 in | Wt 167.0 lb

## 2017-04-07 DIAGNOSIS — J439 Emphysema, unspecified: Secondary | ICD-10-CM

## 2017-04-07 DIAGNOSIS — R911 Solitary pulmonary nodule: Secondary | ICD-10-CM

## 2017-04-07 DIAGNOSIS — R0602 Shortness of breath: Secondary | ICD-10-CM | POA: Diagnosis not present

## 2017-04-07 MED ORDER — UMECLIDINIUM-VILANTEROL 62.5-25 MCG/INH IN AEPB: 1.0000 | INHALATION_SPRAY | Freq: Every day | RESPIRATORY_TRACT | 3 refills | Status: DC

## 2017-04-07 MED ORDER — UMECLIDINIUM-VILANTEROL 62.5-25 MCG/INH IN AEPB: 1.0000 | INHALATION_SPRAY | Freq: Every day | RESPIRATORY_TRACT | 0 refills | Status: DC

## 2017-04-07 NOTE — Addendum Note (Signed)
Addended by: Maurene CapesPOTTS, Versa Craton M on: 04/07/2017 11:00 AM   Modules accepted: Orders
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
## 2017-04-07 NOTE — Addendum Note (Signed)
Addended by: Maurene CapesPOTTS, Carolyna Yerian M on: 04/07/2017 11:09 AM   Modules accepted: Orders

## 2017-04-07 NOTE — Patient Instructions (Signed)
Centrilobular emphysema: You need to tell the individuals around you to stop smoking in the house right away Stay away from cigarette smoke Stop Breo Start Anoro 1 puff daily We will check a blood test today called alpha-1 antitrypsin to see if you have a genetic condition which predisposes you to centrilobular emphysema. We will refer you to pulmonary rehab to help you exercise more Use albuterol as needed for chest tightness wheezing or shortness of breath   Pulmonary nodule: This is 5 mm in size, which is fairly small but given the fact you have emphysema and smoke exposure I think it is best to reimage this We will check another CT scan in December 2019    We will plan on seeing you back in 3 months or sooner if needed

## 2017-04-11 ENCOUNTER — Telehealth (HOSPITAL_COMMUNITY): Payer: Self-pay

## 2017-04-11 NOTE — Telephone Encounter (Signed)
Patients insurance is active and benefits verified through [facility] - no co-pay, no deductible, out of pocket amount of $6,700/$489.88 has been met, no co-insurance and no pre-authorization is required. Spoke with [facility] - reference # [id number]

## 2017-04-11 NOTE — Telephone Encounter (Signed)
Called and spoke with patient in regards to PR - Patient stated she is interested in the program although she gets anxiety with a group of people. She is interested in the 10:30am exc class. Explained to patient that class is full. Once a spot becomes available I will give patient a call to schedule. Patient stated she understands.

## 2017-04-13 LAB — ALPHA-1 ANTITRYPSIN PHENOTYPE: A-1 Antitrypsin, Ser: 138 mg/dL (ref 83–199)

## 2017-04-18 ENCOUNTER — Telehealth (HOSPITAL_COMMUNITY): Payer: Self-pay

## 2017-04-18 NOTE — Telephone Encounter (Signed)
Called to speak with patient to schedule orientation for PR. Patient stated her brother just passed away yesterday. Will follow up in a couple of weeks.

## 2017-05-09 ENCOUNTER — Telehealth (HOSPITAL_COMMUNITY): Payer: Self-pay

## 2017-05-09 NOTE — Telephone Encounter (Signed)
Called to speak with patient to see if now is a good time to schedule for Pulmonary Rehab - Scheduled orientation on 06/12/2017 at 1:30pm. Patient will attend the 10:30am exc class. Mailed packet.

## 2017-05-10 ENCOUNTER — Other Ambulatory Visit (INDEPENDENT_AMBULATORY_CARE_PROVIDER_SITE_OTHER): Payer: Self-pay | Admitting: Orthopaedic Surgery

## 2017-06-08 ENCOUNTER — Telehealth (HOSPITAL_COMMUNITY): Payer: Self-pay

## 2017-06-08 ENCOUNTER — Other Ambulatory Visit: Payer: Self-pay | Admitting: Cardiology

## 2017-06-08 DIAGNOSIS — R937 Abnormal findings on diagnostic imaging of other parts of musculoskeletal system: Secondary | ICD-10-CM

## 2017-06-08 NOTE — Telephone Encounter (Signed)
Patient called to cancel orientation for Pulmonary Rehab due to a death in the family. Patient stated she will call if and when she is ready to participate. Closed referral.

## 2017-06-12 ENCOUNTER — Ambulatory Visit (HOSPITAL_COMMUNITY): Payer: Medicare HMO

## 2017-06-21 ENCOUNTER — Ambulatory Visit
Admission: RE | Admit: 2017-06-21 | Discharge: 2017-06-21 | Disposition: A | Discharge status: Home / Self Care | Payer: Medicare HMO | Source: Ambulatory Visit | Attending: Cardiology | Admitting: Cardiology

## 2017-06-21 DIAGNOSIS — R937 Abnormal findings on diagnostic imaging of other parts of musculoskeletal system: Secondary | ICD-10-CM

## 2017-06-26 ENCOUNTER — Ambulatory Visit: Payer: Medicare HMO | Admitting: Nurse Practitioner

## 2017-06-27 ENCOUNTER — Encounter (HOSPITAL_COMMUNITY): Payer: Self-pay | Admitting: Emergency Medicine

## 2017-06-27 ENCOUNTER — Emergency Department (HOSPITAL_COMMUNITY): Payer: Medicare HMO

## 2017-06-27 ENCOUNTER — Emergency Department (HOSPITAL_COMMUNITY)
Admission: EM | Admit: 2017-06-27 | Discharge: 2017-06-28 | Disposition: A | Discharge status: Home / Self Care | Payer: Medicare HMO | Attending: Emergency Medicine | Admitting: Emergency Medicine

## 2017-06-27 ENCOUNTER — Other Ambulatory Visit: Payer: Self-pay

## 2017-06-27 DIAGNOSIS — E1140 Type 2 diabetes mellitus with diabetic neuropathy, unspecified: Secondary | ICD-10-CM | POA: Insufficient documentation

## 2017-06-27 DIAGNOSIS — I10 Essential (primary) hypertension: Secondary | ICD-10-CM | POA: Insufficient documentation

## 2017-06-27 DIAGNOSIS — R202 Paresthesia of skin: Secondary | ICD-10-CM | POA: Diagnosis not present

## 2017-06-27 DIAGNOSIS — J449 Chronic obstructive pulmonary disease, unspecified: Secondary | ICD-10-CM | POA: Insufficient documentation

## 2017-06-27 DIAGNOSIS — Z7982 Long term (current) use of aspirin: Secondary | ICD-10-CM | POA: Insufficient documentation

## 2017-06-27 DIAGNOSIS — R079 Chest pain, unspecified: Secondary | ICD-10-CM | POA: Diagnosis present

## 2017-06-27 DIAGNOSIS — Z794 Long term (current) use of insulin: Secondary | ICD-10-CM | POA: Insufficient documentation

## 2017-06-27 DIAGNOSIS — Z79899 Other long term (current) drug therapy: Secondary | ICD-10-CM | POA: Insufficient documentation

## 2017-06-27 LAB — DIFFERENTIAL
ABS IMMATURE GRANULOCYTES: 0 10*3/uL (ref 0.0–0.1)
Basophils Absolute: 0.1 10*3/uL (ref 0.0–0.1)
Basophils Relative: 1 %
EOS ABS: 0.1 10*3/uL (ref 0.0–0.7)
Eosinophils Relative: 1 %
IMMATURE GRANULOCYTES: 0 %
LYMPHS ABS: 2.4 10*3/uL (ref 0.7–4.0)
Lymphocytes Relative: 39 %
Monocytes Absolute: 0.5 10*3/uL (ref 0.1–1.0)
Monocytes Relative: 8 %
NEUTROS ABS: 3.1 10*3/uL (ref 1.7–7.7)
Neutrophils Relative %: 51 %

## 2017-06-27 LAB — I-STAT TROPONIN, ED: Troponin i, poc: 0 ng/mL (ref 0.00–0.08)

## 2017-06-27 LAB — CBC
HEMATOCRIT: 42.5 % (ref 36.0–46.0)
HEMOGLOBIN: 13.2 g/dL (ref 12.0–15.0)
MCH: 26.6 pg (ref 26.0–34.0)
MCHC: 31.1 g/dL (ref 30.0–36.0)
MCV: 85.7 fL (ref 78.0–100.0)
PLATELETS: 209 10*3/uL (ref 150–400)
RBC: 4.96 MIL/uL (ref 3.87–5.11)
RDW: 13.6 % (ref 11.5–15.5)
WBC: 6.2 10*3/uL (ref 4.0–10.5)

## 2017-06-27 LAB — PROTIME-INR
INR: 0.89
PROTHROMBIN TIME: 12 s (ref 11.4–15.2)

## 2017-06-27 LAB — COMPREHENSIVE METABOLIC PANEL
ALBUMIN: 3.9 g/dL (ref 3.5–5.0)
ALT: 24 U/L (ref 14–54)
AST: 22 U/L (ref 15–41)
Alkaline Phosphatase: 60 U/L (ref 38–126)
Anion gap: 9 (ref 5–15)
BILIRUBIN TOTAL: 0.2 mg/dL — AB (ref 0.3–1.2)
BUN: 13 mg/dL (ref 6–20)
CO2: 22 mmol/L (ref 22–32)
CREATININE: 0.97 mg/dL (ref 0.44–1.00)
Calcium: 9.2 mg/dL (ref 8.9–10.3)
Chloride: 111 mmol/L (ref 101–111)
GFR calc Af Amer: >60 mL/min (ref >60)
GFR calc non Af Amer: >60 mL/min (ref >60)
GLUCOSE: 157 mg/dL — AB (ref 65–99)
POTASSIUM: 3.9 mmol/L (ref 3.5–5.1)
Sodium: 142 mmol/L (ref 135–145)
TOTAL PROTEIN: 6.9 g/dL (ref 6.5–8.1)

## 2017-06-27 LAB — I-STAT CHEM 8, ED
BUN: 12 mg/dL (ref 6–20)
CREATININE: 0.9 mg/dL (ref 0.44–1.00)
Calcium, Ion: 1.18 mmol/L (ref 1.15–1.40)
Chloride: 109 mmol/L (ref 101–111)
Glucose, Bld: 157 mg/dL — ABNORMAL HIGH (ref 65–99)
HEMATOCRIT: 40 % (ref 36.0–46.0)
Hemoglobin: 13.6 g/dL (ref 12.0–15.0)
Potassium: 3.8 mmol/L (ref 3.5–5.1)
Sodium: 143 mmol/L (ref 135–145)
TCO2: 21 mmol/L — ABNORMAL LOW (ref 22–32)

## 2017-06-27 LAB — I-STAT BETA HCG BLOOD, ED (MC, WL, AP ONLY): I-stat hCG, quantitative: <5 m[IU]/mL (ref <5)

## 2017-06-27 LAB — APTT: aPTT: 33 seconds (ref 24–36)

## 2017-06-27 MED ORDER — HYDROCODONE-ACETAMINOPHEN 10-325 MG PO TABS: 1.0000 | ORAL_TABLET | Freq: Four times a day (QID) | ORAL | Status: DC
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
## 2017-06-27 NOTE — Discharge Instructions (Addendum)
Your work-up has been reassuring.  The provider would like for you to follow-up with your primary care doctor.  May need referral to neurology if symptoms persist.  Return to ED if you develop any weakness, vision changes, worsening chest pain, worsening shortness of breath.  Take your chronic pain medication that you have at home.

## 2017-06-27 NOTE — ED Provider Notes (Signed)
Care assumed from previous provider Dr. Rubin Payor. Please see their note for further details to include full history and physical.please see prior providers complete note for history and physical.  Case discussed, plan agreed upon.   At time of care handoff was awaiting x-ray of chest.  Prior provider felt that if this was normal she can be discharged home with outpatient follow-up.  She does have chronic narcotic pain medication that was just filled yesterday 90 pills that she can take for pain.  Patient's x-ray returned that was reassuring.  Patient requesting pain medication on discharge.  I offered her home medication of her hydrocodone that she refused.  Vital signs remained reassuring.  Patient ambulated to bathroom with normal gait.  Encouraged outpatient follow-up.  Pt is hemodynamically stable, in NAD, & able to ambulate in the ED. Evaluation does not show pathology that would require ongoing emergent intervention or inpatient treatment. I explained the diagnosis to the patient. Pain has been managed & has no complaints prior to dc. Pt is comfortable with above plan and is stable for discharge at this time. All questions were answered prior to disposition. Strict return precautions for f/u to the ED were discussed. Encouraged follow up with PCP.  Results for orders placed or performed during the hospital encounter of 06/27/17 (from the past 24 hour(s))  Protime-INR     Status: None   Collection Time: 06/27/17  4:27 PM  Result Value Ref Range   Prothrombin Time 12.0 11.4 - 15.2 seconds   INR 0.89   APTT     Status: None   Collection Time: 06/27/17  4:27 PM  Result Value Ref Range   aPTT 33 24 - 36 seconds  CBC     Status: None   Collection Time: 06/27/17  4:27 PM  Result Value Ref Range   WBC 6.2 4.0 - 10.5 K/uL   RBC 4.96 3.87 - 5.11 MIL/uL   Hemoglobin 13.2 12.0 - 15.0 g/dL   HCT 16.1 09.6 - 04.5 %   MCV 85.7 78.0 - 100.0 fL   MCH 26.6 26.0 - 34.0 pg   MCHC 31.1 30.0 - 36.0  g/dL   RDW 40.9 81.1 - 91.4 %   Platelets 209 150 - 400 K/uL  Differential     Status: None   Collection Time: 06/27/17  4:27 PM  Result Value Ref Range   Neutrophils Relative % 51 %   Neutro Abs 3.1 1.7 - 7.7 K/uL   Lymphocytes Relative 39 %   Lymphs Abs 2.4 0.7 - 4.0 K/uL   Monocytes Relative 8 %   Monocytes Absolute 0.5 0.1 - 1.0 K/uL   Eosinophils Relative 1 %   Eosinophils Absolute 0.1 0.0 - 0.7 K/uL   Basophils Relative 1 %   Basophils Absolute 0.1 0.0 - 0.1 K/uL   Immature Granulocytes 0 %   Abs Immature Granulocytes 0.0 0.0 - 0.1 K/uL  Comprehensive metabolic panel     Status: Abnormal   Collection Time: 06/27/17  4:27 PM  Result Value Ref Range   Sodium 142 135 - 145 mmol/L   Potassium 3.9 3.5 - 5.1 mmol/L   Chloride 111 101 - 111 mmol/L   CO2 22 22 - 32 mmol/L   Glucose, Bld 157 (H) 65 - 99 mg/dL   BUN 13 6 - 20 mg/dL   Creatinine, Ser 7.82 0.44 - 1.00 mg/dL   Calcium 9.2 8.9 - 95.6 mg/dL   Total Protein 6.9 6.5 - 8.1 g/dL  Albumin 3.9 3.5 - 5.0 g/dL   AST 22 15 - 41 U/L   ALT 24 14 - 54 U/L   Alkaline Phosphatase 60 38 - 126 U/L   Total Bilirubin 0.2 (L) 0.3 - 1.2 mg/dL   GFR calc non Af Amer >60 >60 mL/min   GFR calc Af Amer >60 >60 mL/min   Anion gap 9 5 - 15  I-stat troponin, ED     Status: None   Collection Time: 06/27/17  4:42 PM  Result Value Ref Range   Troponin i, poc 0.00 0.00 - 0.08 ng/mL   Comment 3          I-Stat beta hCG blood, ED     Status: None   Collection Time: 06/27/17  4:42 PM  Result Value Ref Range   I-stat hCG, quantitative <5.0 <5 mIU/mL   Comment 3          I-Stat Chem 8, ED     Status: Abnormal   Collection Time: 06/27/17  4:44 PM  Result Value Ref Range   Sodium 143 135 - 145 mmol/L   Potassium 3.8 3.5 - 5.1 mmol/L   Chloride 109 101 - 111 mmol/L   BUN 12 6 - 20 mg/dL   Creatinine, Ser 0.980.90 0.44 - 1.00 mg/dL   Glucose, Bld 119157 (H) 65 - 99 mg/dL   Calcium, Ion 1.471.18 8.291.15 - 1.40 mmol/L   TCO2 21 (L) 22 - 32 mmol/L    Hemoglobin 13.6 12.0 - 15.0 g/dL   HCT 56.240.0 13.036.0 - 86.546.0 %          Annette Norman, Annette Norman T, PA-C 06/28/17 [id number]    Benjiman CorePickering, Nathan, MD 06/28/17 2158

## 2017-06-27 NOTE — ED Triage Notes (Addendum)
Pt here for CP/SOB started today. CP radiates into left arm and axilla. Pt feels nauseous. Pt complaining of headache and feeling lightheaded, left leg weaker than right and left arm "not working as well" for 3 days. Pt states she feels like she has not been able to get her thoughts out "right" for 3 days as well. No new neuro symptoms today.

## 2017-06-28 NOTE — ED Notes (Signed)
Patient Alert and oriented to baseline. Stable and ambulatory to baseline. Patient verbalized understanding of the discharge instructions.  Patient belongings were taken by the patient. Patient has a ride home.

## 2017-08-08 ENCOUNTER — Encounter: Payer: Self-pay | Admitting: Pulmonary Disease

## 2017-08-08 ENCOUNTER — Ambulatory Visit (INDEPENDENT_AMBULATORY_CARE_PROVIDER_SITE_OTHER): Payer: Medicare HMO | Admitting: Pulmonary Disease

## 2017-08-08 VITALS — BP 120/72 | HR 97 | Ht 65.0 in | Wt 164.0 lb

## 2017-08-08 DIAGNOSIS — R911 Solitary pulmonary nodule: Secondary | ICD-10-CM | POA: Diagnosis not present

## 2017-08-08 DIAGNOSIS — J439 Emphysema, unspecified: Secondary | ICD-10-CM
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
## 2017-08-08 NOTE — Patient Instructions (Signed)
COPD: Continue Anoro Get a flu shot in the fall Practice good hand hygiene Stay active  Follow-up in 1 year or sooner if needed

## 2017-08-28 ENCOUNTER — Other Ambulatory Visit (INDEPENDENT_AMBULATORY_CARE_PROVIDER_SITE_OTHER): Payer: Self-pay | Admitting: Orthopaedic Surgery

## 2017-09-08 ENCOUNTER — Other Ambulatory Visit: Payer: Self-pay | Admitting: Pulmonary Disease
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
## 2017-10-10 ENCOUNTER — Encounter: Payer: Self-pay | Admitting: Nurse Practitioner

## 2017-10-10 ENCOUNTER — Ambulatory Visit (INDEPENDENT_AMBULATORY_CARE_PROVIDER_SITE_OTHER): Payer: Medicare HMO | Admitting: Nurse Practitioner

## 2017-10-10 DIAGNOSIS — G4733 Obstructive sleep apnea (adult) (pediatric): Secondary | ICD-10-CM

## 2017-10-10 DIAGNOSIS — M542 Cervicalgia: Secondary | ICD-10-CM | POA: Diagnosis not present

## 2017-10-10 DIAGNOSIS — G4700 Insomnia, unspecified: Secondary | ICD-10-CM | POA: Diagnosis not present

## 2017-10-10 DIAGNOSIS — G43009 Migraine without aura, not intractable, without status migrainosus: Secondary | ICD-10-CM

## 2017-10-10 NOTE — Patient Instructions (Signed)
Continue topiramate 100mg  twice a day Continue rizatriptan as needed for migraine rescue Continue  (emgality) monthly for now Given neck exercises to perform Follow up with sleep physician for CPAP adjustments I explained in particular the risks and ramifications of untreated moderate to severe OSA, especially with respect to cardiovascular disease  including congestive heart failure, difficult to treat hypertension, cardiac arrhythmias, or stroke. Even type 2 diabetes has, in part, been linked to untreated OSA. Symptoms of untreated OSA include daytime sleepiness, memory problems, mood irritability and mood disorder such as depression and anxiety, lack of energy, as well as recurrent headaches, especially morning headaches. We talked about trying to maintain a healthy lifestyle in general, as well as the importance of weight control. I encouraged the patient to eat healthy, exercise daily and keep well hydrated, to keep a scheduled bedtime and wake time routine, to not skip any meals and eat healthy snacks in between meals

## 2017-10-11 NOTE — Progress Notes (Signed)
I reviewed note and agree with plan.   VIKRAM R. PENUMALLI, MD 10/11/2017, 3:21 PM Certified in Neurology, Neurophysiology and Neuroimaging  [facility] [address], [address] [place], [place] [address] [phone]  

## 2017-11-25 ENCOUNTER — Other Ambulatory Visit (INDEPENDENT_AMBULATORY_CARE_PROVIDER_SITE_OTHER): Payer: Self-pay | Admitting: Physician Assistant

## 2017-11-27 ENCOUNTER — Other Ambulatory Visit (INDEPENDENT_AMBULATORY_CARE_PROVIDER_SITE_OTHER): Payer: Self-pay | Admitting: Physician Assistant

## 2017-11-27 NOTE — Telephone Encounter (Signed)
Will you refill this but do 7.5 instead of 15mg ?

## 2017-12-27 ENCOUNTER — Ambulatory Visit (INDEPENDENT_AMBULATORY_CARE_PROVIDER_SITE_OTHER): Payer: Medicare HMO

## 2017-12-27 ENCOUNTER — Encounter (INDEPENDENT_AMBULATORY_CARE_PROVIDER_SITE_OTHER): Payer: Self-pay | Admitting: Orthopaedic Surgery

## 2017-12-27 ENCOUNTER — Ambulatory Visit (INDEPENDENT_AMBULATORY_CARE_PROVIDER_SITE_OTHER): Payer: Medicare HMO | Admitting: Orthopaedic Surgery

## 2017-12-27 DIAGNOSIS — M25552 Pain in left hip: Secondary | ICD-10-CM | POA: Insufficient documentation

## 2017-12-27 MED ORDER — BUPIVACAINE HCL 0.25 % IJ SOLN
2.0000 mL | INTRAMUSCULAR | Status: AC | PRN
  Administered 2017-12-27: 2 mL via INTRA_ARTICULAR

## 2017-12-27 MED ORDER — LIDOCAINE HCL 1 % IJ SOLN
3.0000 mL | INTRAMUSCULAR | Status: AC | PRN
Start: 2017-12-27 — End: 2017-12-27
  Administered 2017-12-27: 3 mL

## 2017-12-27 MED ORDER — KETOROLAC TROMETHAMINE 30 MG/ML IJ SOLN: 30.0000 mg | Freq: Once | INTRAMUSCULAR | Status: AC
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
## 2017-12-29 ENCOUNTER — Encounter (HOSPITAL_BASED_OUTPATIENT_CLINIC_OR_DEPARTMENT_OTHER): Payer: Self-pay

## 2017-12-29 ENCOUNTER — Other Ambulatory Visit: Payer: Self-pay

## 2017-12-29 DIAGNOSIS — E119 Type 2 diabetes mellitus without complications: Secondary | ICD-10-CM | POA: Diagnosis not present

## 2017-12-29 DIAGNOSIS — R112 Nausea with vomiting, unspecified: Secondary | ICD-10-CM | POA: Diagnosis not present

## 2017-12-29 DIAGNOSIS — I10 Essential (primary) hypertension: Secondary | ICD-10-CM | POA: Diagnosis not present

## 2017-12-29 DIAGNOSIS — J449 Chronic obstructive pulmonary disease, unspecified: Secondary | ICD-10-CM | POA: Insufficient documentation

## 2017-12-29 DIAGNOSIS — R1031 Right lower quadrant pain: Secondary | ICD-10-CM | POA: Insufficient documentation

## 2017-12-29 DIAGNOSIS — Z79899 Other long term (current) drug therapy: Secondary | ICD-10-CM | POA: Insufficient documentation

## 2017-12-29 LAB — URINALYSIS, ROUTINE W REFLEX MICROSCOPIC
Bilirubin Urine: NEGATIVE
Glucose, UA: >=500 mg/dL — AB
Hgb urine dipstick: NEGATIVE
Ketones, ur: NEGATIVE mg/dL
Leukocytes, UA: NEGATIVE
Nitrite: NEGATIVE
PH: 7 (ref 5.0–8.0)
Protein, ur: NEGATIVE mg/dL
Specific Gravity, Urine: 1.01 (ref 1.005–1.030)

## 2017-12-29 LAB — URINALYSIS, MICROSCOPIC (REFLEX): WBC, UA: NONE SEEN WBC/hpf (ref 0–5)

## 2017-12-29 NOTE — ED Triage Notes (Signed)
C/o RLQ pain, n/v x 4 today-was seen at [facility] at 430pm and advised to go to ED

## 2017-12-30 ENCOUNTER — Emergency Department (HOSPITAL_BASED_OUTPATIENT_CLINIC_OR_DEPARTMENT_OTHER)
Admission: EM | Admit: 2017-12-30 | Discharge: 2017-12-30 | Disposition: A | Discharge status: Home / Self Care | Payer: Medicare HMO | Attending: Emergency Medicine | Admitting: Emergency Medicine

## 2017-12-30 ENCOUNTER — Emergency Department (HOSPITAL_BASED_OUTPATIENT_CLINIC_OR_DEPARTMENT_OTHER): Payer: Medicare HMO

## 2017-12-30 DIAGNOSIS — R1031 Right lower quadrant pain: Secondary | ICD-10-CM

## 2017-12-30 LAB — COMPREHENSIVE METABOLIC PANEL
ALT: 32 U/L (ref 0–44)
AST: 19 U/L (ref 15–41)
Albumin: 4.3 g/dL (ref 3.5–5.0)
Alkaline Phosphatase: 66 U/L (ref 38–126)
Anion gap: 7 (ref 5–15)
BUN: 17 mg/dL (ref 6–20)
CHLORIDE: 109 mmol/L (ref 98–111)
CO2: 23 mmol/L (ref 22–32)
Calcium: 9.1 mg/dL (ref 8.9–10.3)
Creatinine, Ser: 0.92 mg/dL (ref 0.44–1.00)
GFR calc Af Amer: >60 mL/min (ref >60)
Glucose, Bld: 198 mg/dL — ABNORMAL HIGH (ref 70–99)
Potassium: 3.9 mmol/L (ref 3.5–5.1)
Sodium: 139 mmol/L (ref 135–145)
Total Bilirubin: 0.3 mg/dL (ref 0.3–1.2)
Total Protein: 7.3 g/dL (ref 6.5–8.1)

## 2017-12-30 LAB — CBC
HCT: 45.1 % (ref 36.0–46.0)
Hemoglobin: 13.9 g/dL (ref 12.0–15.0)
MCH: 26.7 pg (ref 26.0–34.0)
MCHC: 30.8 g/dL (ref 30.0–36.0)
MCV: 86.7 fL (ref 80.0–100.0)
PLATELETS: 223 10*3/uL (ref 150–400)
RBC: 5.2 MIL/uL — ABNORMAL HIGH (ref 3.87–5.11)
RDW: 13.7 % (ref 11.5–15.5)
WBC: 7.7 10*3/uL (ref 4.0–10.5)
nRBC: 0 % (ref 0.0–0.2)

## 2017-12-30 LAB — LIPASE, BLOOD: LIPASE: 46 U/L (ref 11–51)

## 2017-12-30 MED ORDER — IOPAMIDOL (ISOVUE-300) INJECTION 61%
100.0000 mL | Freq: Once | INTRAVENOUS | Status: AC | PRN
  Administered 2017-12-30: 100 mL via INTRAVENOUS

## 2017-12-30 MED ORDER — ONDANSETRON 4 MG PO TBDP: 4.0000 mg | ORAL_TABLET | Freq: Three times a day (TID) | ORAL | 0 refills | Status: DC | PRN

## 2017-12-30 MED ORDER — ONDANSETRON HCL 4 MG/2ML IJ SOLN
4.0000 mg | Freq: Once | INTRAMUSCULAR | Status: AC
  Administered 2017-12-30: 4 mg via INTRAVENOUS
  Filled 2017-12-30: qty 2

## 2017-12-30 MED ORDER — ACETAMINOPHEN 500 MG PO TABS: 500.0000 mg | ORAL_TABLET | Freq: Four times a day (QID) | ORAL | 0 refills | Status: DC | PRN

## 2017-12-30 MED ORDER — MORPHINE SULFATE (PF) 4 MG/ML IV SOLN
4.0000 mg | Freq: Once | INTRAVENOUS | Status: AC
  Administered 2017-12-30: 4 mg via INTRAVENOUS
  Filled 2017-12-30: qty 1

## 2017-12-30 NOTE — ED Notes (Signed)
Gave patient sprite for po challenge.  
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
## 2017-12-30 NOTE — Discharge Instructions (Addendum)
You were seen today for abdominal pain.  The cause of your pain at this time is unknown.  Your CT scan is negative.  Medications as prescribed.  Follow-up with your primary doctor if symptoms not resolving.

## 2018-01-09 ENCOUNTER — Inpatient Hospital Stay: Admission: RE | Admit: 2018-01-09 | Payer: Medicare HMO | Source: Ambulatory Visit

## 2018-01-12 ENCOUNTER — Ambulatory Visit (INDEPENDENT_AMBULATORY_CARE_PROVIDER_SITE_OTHER)
Admission: RE | Admit: 2018-01-12 | Discharge: 2018-01-12 | Disposition: A | Discharge status: Home / Self Care | Payer: Medicare HMO | Source: Ambulatory Visit | Attending: Pulmonary Disease | Admitting: Pulmonary Disease

## 2018-01-12 DIAGNOSIS — R911 Solitary pulmonary nodule: Secondary | ICD-10-CM

## 2018-01-15 ENCOUNTER — Telehealth: Payer: Self-pay | Admitting: Pulmonary Disease

## 2018-01-15 NOTE — Telephone Encounter (Signed)
Pt is calling back [phone]

## 2018-01-15 NOTE — Telephone Encounter (Signed)
Call made to patient, made aware of CT results per BQ. Voiced understanding. Nothing further is needed at this time.

## 2018-01-15 NOTE — Telephone Encounter (Signed)
Notes recorded by Lupita LeashMcQuaid, Douglas B, MD on 01/12/2018 at 5:23 PM EST A Please let the patient know this was OK Thanks, B ---------  lmtcb for pt to relay CT chest results.

## 2018-02-13 ENCOUNTER — Other Ambulatory Visit: Payer: Self-pay | Admitting: Diagnostic Neuroimaging

## 2018-02-21 ENCOUNTER — Telehealth: Payer: Self-pay | Admitting: *Deleted

## 2018-02-21 NOTE — Telephone Encounter (Signed)
Received PA request for pt's Emgaility. She was last seen in September, and documentation shows she reported no decrease in h/a's with Emgality. Sts. she has continued Emgaility since then, no lapse in treatment, but sts has had no decrease in frequency or severity of h/a's. I explained ins. will not continue to cover Emgaility since it is not helping. She has a pending appt. with Eber Jones in March and can discuss other tx options at that time.  I will not complete Emgality PA since she denies benefit from it/fim

## 2018-03-08 ENCOUNTER — Emergency Department (HOSPITAL_BASED_OUTPATIENT_CLINIC_OR_DEPARTMENT_OTHER): Payer: Medicare HMO

## 2018-03-08 ENCOUNTER — Other Ambulatory Visit: Payer: Self-pay

## 2018-03-08 ENCOUNTER — Emergency Department (HOSPITAL_BASED_OUTPATIENT_CLINIC_OR_DEPARTMENT_OTHER)
Admission: EM | Admit: 2018-03-08 | Discharge: 2018-03-08 | Disposition: A | Discharge status: Home / Self Care | Payer: Medicare HMO | Attending: Emergency Medicine | Admitting: Emergency Medicine

## 2018-03-08 ENCOUNTER — Encounter (HOSPITAL_BASED_OUTPATIENT_CLINIC_OR_DEPARTMENT_OTHER): Payer: Self-pay | Admitting: Emergency Medicine

## 2018-03-08 DIAGNOSIS — E1143 Type 2 diabetes mellitus with diabetic autonomic (poly)neuropathy: Secondary | ICD-10-CM | POA: Diagnosis not present

## 2018-03-08 DIAGNOSIS — R072 Precordial pain: Secondary | ICD-10-CM

## 2018-03-08 DIAGNOSIS — J449 Chronic obstructive pulmonary disease, unspecified: Secondary | ICD-10-CM | POA: Insufficient documentation

## 2018-03-08 DIAGNOSIS — I10 Essential (primary) hypertension: Secondary | ICD-10-CM | POA: Diagnosis not present

## 2018-03-08 DIAGNOSIS — G43809 Other migraine, not intractable, without status migrainosus: Secondary | ICD-10-CM | POA: Insufficient documentation

## 2018-03-08 DIAGNOSIS — Z794 Long term (current) use of insulin: Secondary | ICD-10-CM | POA: Diagnosis not present

## 2018-03-08 DIAGNOSIS — Z79899 Other long term (current) drug therapy: Secondary | ICD-10-CM | POA: Insufficient documentation

## 2018-03-08 DIAGNOSIS — Z7982 Long term (current) use of aspirin: Secondary | ICD-10-CM | POA: Diagnosis not present

## 2018-03-08 DIAGNOSIS — G43109 Migraine with aura, not intractable, without status migrainosus: Secondary | ICD-10-CM

## 2018-03-08 DIAGNOSIS — R51 Headache: Secondary | ICD-10-CM | POA: Diagnosis present

## 2018-03-08 LAB — TROPONIN I: Troponin I: <0.03 ng/mL (ref <0.03)

## 2018-03-08 LAB — COMPREHENSIVE METABOLIC PANEL
ALT: 27 U/L (ref 0–44)
AST: 19 U/L (ref 15–41)
Albumin: 4.4 g/dL (ref 3.5–5.0)
Alkaline Phosphatase: 70 U/L (ref 38–126)
Anion gap: 9 (ref 5–15)
BUN: 17 mg/dL (ref 6–20)
CO2: 24 mmol/L (ref 22–32)
Calcium: 9.1 mg/dL (ref 8.9–10.3)
Chloride: 106 mmol/L (ref 98–111)
Creatinine, Ser: 0.84 mg/dL (ref 0.44–1.00)
GFR calc Af Amer: >60 mL/min (ref >60)
GFR calc non Af Amer: >60 mL/min (ref >60)
Glucose, Bld: 173 mg/dL — ABNORMAL HIGH (ref 70–99)
Potassium: 3.7 mmol/L (ref 3.5–5.1)
Sodium: 139 mmol/L (ref 135–145)
Total Bilirubin: 0.4 mg/dL (ref 0.3–1.2)
Total Protein: 7.4 g/dL (ref 6.5–8.1)

## 2018-03-08 LAB — CBC WITH DIFFERENTIAL/PLATELET
Abs Immature Granulocytes: 0.03 10*3/uL (ref 0.00–0.07)
Basophils Absolute: 0.1 10*3/uL (ref 0.0–0.1)
Basophils Relative: 1 %
Eosinophils Absolute: 0.2 10*3/uL (ref 0.0–0.5)
Eosinophils Relative: 3 %
HCT: 46.1 % — ABNORMAL HIGH (ref 36.0–46.0)
HEMOGLOBIN: 14.2 g/dL (ref 12.0–15.0)
Immature Granulocytes: 0 %
Lymphocytes Relative: 33 %
Lymphs Abs: 2.6 10*3/uL (ref 0.7–4.0)
MCH: 26.5 pg (ref 26.0–34.0)
MCHC: 30.8 g/dL (ref 30.0–36.0)
MCV: 86.2 fL (ref 80.0–100.0)
Monocytes Absolute: 0.7 10*3/uL (ref 0.1–1.0)
Monocytes Relative: 9 %
Neutro Abs: 4.3 10*3/uL (ref 1.7–7.7)
Neutrophils Relative %: 54 %
Platelets: 232 10*3/uL (ref 150–400)
RBC: 5.35 MIL/uL — ABNORMAL HIGH (ref 3.87–5.11)
RDW: 12.8 % (ref 11.5–15.5)
WBC: 7.9 10*3/uL (ref 4.0–10.5)
nRBC: 0 % (ref 0.0–0.2)

## 2018-03-08 MED ORDER — METOCLOPRAMIDE HCL 5 MG/ML IJ SOLN
10.0000 mg | Freq: Once | INTRAMUSCULAR | Status: AC
  Administered 2018-03-08: 10 mg via INTRAVENOUS
  Filled 2018-03-08: qty 2

## 2018-03-08 MED ORDER — SODIUM CHLORIDE 0.9 % IV BOLUS
1000.0000 mL | Freq: Once | INTRAVENOUS | Status: AC
  Administered 2018-03-08: 1000 mL via INTRAVENOUS

## 2018-03-08 MED ORDER — DEXAMETHASONE SODIUM PHOSPHATE 10 MG/ML IJ SOLN
10.0000 mg | Freq: Once | INTRAMUSCULAR | Status: AC
  Administered 2018-03-08: 10 mg via INTRAVENOUS
  Filled 2018-03-08: qty 1

## 2018-03-08 MED ORDER — DIPHENHYDRAMINE HCL 50 MG/ML IJ SOLN
25.0000 mg | Freq: Once | INTRAMUSCULAR | Status: AC
  Administered 2018-03-08: 25 mg via INTRAVENOUS
  Filled 2018-03-08: qty 1

## 2018-03-08 MED ORDER — SODIUM CHLORIDE 0.9 % IV SOLN: INTRAVENOUS | Status: DC
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
## 2018-03-08 NOTE — ED Notes (Signed)
Patient transported to CT 

## 2018-03-08 NOTE — ED Triage Notes (Addendum)
Reports left sided headache x 2 days.  Denies nausea, vomiting. Endorses photophobia.  Reports the pain radiates down the left side of her face, neck, left shoulder, chest, left side of back and left leg.  Reports the radiating pain began today.

## 2018-03-08 NOTE — Discharge Instructions (Addendum)
Go home and rest.  See if the migraine cocktail medicine works.  Contact your [place] neurology folks if not feeling better.  Based on the work-up at [facility] in October this is most likely a complicated migraine.  Head CT here today without any acute findings.  An extensive work-up at [facility] showed no evidence of stroke with almost identical presentation.  To return for any new or worse symptoms.

## 2018-03-08 NOTE — ED Notes (Signed)
ED Provider at bedside. 

## 2018-03-11 ENCOUNTER — Other Ambulatory Visit (INDEPENDENT_AMBULATORY_CARE_PROVIDER_SITE_OTHER): Payer: Self-pay | Admitting: Physician Assistant

## 2018-03-12 NOTE — Telephone Encounter (Signed)
Ok to call in

## 2018-04-03 ENCOUNTER — Other Ambulatory Visit: Payer: Self-pay | Admitting: Diagnostic Neuroimaging
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
## 2018-04-10 ENCOUNTER — Other Ambulatory Visit: Payer: Self-pay

## 2018-04-10 ENCOUNTER — Ambulatory Visit (INDEPENDENT_AMBULATORY_CARE_PROVIDER_SITE_OTHER): Payer: Medicare HMO | Admitting: Nurse Practitioner

## 2018-04-10 VITALS — BP 112/78 | HR 106 | Ht 64.0 in | Wt 157.6 lb

## 2018-04-10 DIAGNOSIS — M542 Cervicalgia: Secondary | ICD-10-CM | POA: Diagnosis not present

## 2018-04-10 DIAGNOSIS — G43009 Migraine without aura, not intractable, without status migrainosus: Secondary | ICD-10-CM | POA: Diagnosis not present

## 2018-04-10 DIAGNOSIS — G4733 Obstructive sleep apnea (adult) (pediatric): Secondary | ICD-10-CM

## 2018-04-10 NOTE — Patient Instructions (Addendum)
Continue topiramate 100mg  twice a day Continue rizatriptan as needed for migraine rescue Given neck exercises to perform at least daily We will check with insurance to see if Aimovig or Ajovy are covered Follow up with sleep physician for CPAP adjustments Follow up 34months

## 2018-04-16 NOTE — Progress Notes (Signed)
I reviewed note and agree with plan.   VIKRAM R. PENUMALLI, MD  Certified in Neurology, Neurophysiology and Neuroimaging  [facility] [address], [address] [place], [place] [address] [phone]   

## 2018-04-24 ENCOUNTER — Telehealth: Payer: Self-pay | Admitting: *Deleted

## 2018-04-24 DIAGNOSIS — G43009 Migraine without aura, not intractable, without status migrainosus: Secondary | ICD-10-CM

## 2018-04-24 MED ORDER — ERENUMAB-AOOE 140 MG/ML ~~LOC~~ SOAJ: 140.0000 mg | SUBCUTANEOUS | 3 refills | Status: DC

## 2018-04-24 NOTE — Telephone Encounter (Signed)
Called patient to advise her of new Rx approved by [facility] and ordered by Dr Marjory Lies. I advised her it will be sent in for 3 months supply because the cost is the same as 1 month, $8.95. I asked if she approved [facility] [address] so she will get it sooner. She agreed,  verbalized understanding, appreciation. Aimovig 140 mg/mL ordered per verbal order from Dr Lewie Loron, disp #3, with 3 refills.

## 2018-04-24 NOTE — Telephone Encounter (Addendum)
Started Aimovig PA on CMM, key: [id number]. DX: g43.009. Tried/failed: topiramate, rizatriptan, Emgality; Cannot take propranolol (low BP), depakote (weight gain, med interaction). Information sent to [facility].

## 2018-04-24 NOTE — Telephone Encounter (Signed)
Aimovig PA Case: [id number],  Approved, Coverage Starts on: 04/24/2018 12:00:00 AM, Coverage Ends on: 07/23/2018 12:00:00 AM. Questions? Contact [phone]. Faxed approval to [facility] [place].

## 2018-04-24 NOTE — Telephone Encounter (Signed)
Ok to switch to [employer] monthly injections. -VRP

## 2018-04-24 NOTE — Telephone Encounter (Signed)
Received fax from [facility] stating the patient was given temporary supply of Emgality, and PA must be done. Preferred alternatives: Divalproex, Topiramate, Metoprolol, Propranolol. I called patient because Faith RN had spoken to her in Jan, and pt stated Emgality wasn't effective. Pt stated she received one pen, due to inject 04/25/18. She saw Enid Skeens, NP two weeks ago and reported insurance wasn't paying for [employer] It was noted that insurance would be called to see if aimovig or ajovy would be approved. She agrees to try either one. I advised will discuss with Dr Marjory Lies and let her know. Patient verbalized understanding, appreciation. [facility], spoke with Apolinar Junes to check coverage for CRGP. He stated Aimovig 140 mg/mL is covered, costs $8.95 for either 1 month or 3 months supply, can be filled at local pharmacy or through [employer]. Will advise Dr Marjory Lies.

## 2018-04-27 ENCOUNTER — Encounter (HOSPITAL_BASED_OUTPATIENT_CLINIC_OR_DEPARTMENT_OTHER): Payer: Self-pay | Admitting: Emergency Medicine

## 2018-04-27 ENCOUNTER — Other Ambulatory Visit: Payer: Self-pay

## 2018-04-27 ENCOUNTER — Emergency Department (HOSPITAL_BASED_OUTPATIENT_CLINIC_OR_DEPARTMENT_OTHER)
Admission: EM | Admit: 2018-04-27 | Discharge: 2018-04-27 | Disposition: A | Discharge status: Home / Self Care | Payer: Medicare HMO | Attending: Emergency Medicine | Admitting: Emergency Medicine

## 2018-04-27 ENCOUNTER — Emergency Department (HOSPITAL_BASED_OUTPATIENT_CLINIC_OR_DEPARTMENT_OTHER): Payer: Medicare HMO

## 2018-04-27 DIAGNOSIS — Z79899 Other long term (current) drug therapy: Secondary | ICD-10-CM | POA: Insufficient documentation

## 2018-04-27 DIAGNOSIS — E1143 Type 2 diabetes mellitus with diabetic autonomic (poly)neuropathy: Secondary | ICD-10-CM | POA: Insufficient documentation

## 2018-04-27 DIAGNOSIS — I10 Essential (primary) hypertension: Secondary | ICD-10-CM | POA: Diagnosis not present

## 2018-04-27 DIAGNOSIS — R0789 Other chest pain: Secondary | ICD-10-CM | POA: Insufficient documentation

## 2018-04-27 DIAGNOSIS — J449 Chronic obstructive pulmonary disease, unspecified: Secondary | ICD-10-CM | POA: Diagnosis not present

## 2018-04-27 DIAGNOSIS — Z7982 Long term (current) use of aspirin: Secondary | ICD-10-CM | POA: Insufficient documentation

## 2018-04-27 DIAGNOSIS — Z794 Long term (current) use of insulin: Secondary | ICD-10-CM | POA: Diagnosis not present

## 2018-04-27 DIAGNOSIS — R0602 Shortness of breath: Secondary | ICD-10-CM | POA: Diagnosis present

## 2018-04-27 LAB — BASIC METABOLIC PANEL
Anion gap: 8 (ref 5–15)
BUN: 19 mg/dL (ref 6–20)
CO2: 25 mmol/L (ref 22–32)
Calcium: 9.1 mg/dL (ref 8.9–10.3)
Chloride: 101 mmol/L (ref 98–111)
Creatinine, Ser: 0.76 mg/dL (ref 0.44–1.00)
GFR calc Af Amer: >60 mL/min (ref >60)
GFR calc non Af Amer: >60 mL/min (ref >60)
Glucose, Bld: 228 mg/dL — ABNORMAL HIGH (ref 70–99)
Potassium: 3.5 mmol/L (ref 3.5–5.1)
Sodium: 134 mmol/L — ABNORMAL LOW (ref 135–145)

## 2018-04-27 LAB — CBC WITH DIFFERENTIAL/PLATELET
Abs Immature Granulocytes: 0.03 10*3/uL (ref 0.00–0.07)
Basophils Absolute: 0.1 10*3/uL (ref 0.0–0.1)
Basophils Relative: 1 %
Eosinophils Absolute: 0.2 10*3/uL (ref 0.0–0.5)
Eosinophils Relative: 2 %
HCT: 48.9 % — ABNORMAL HIGH (ref 36.0–46.0)
Hemoglobin: 15 g/dL (ref 12.0–15.0)
Immature Granulocytes: 0 %
Lymphocytes Relative: 31 %
Lymphs Abs: 2.6 10*3/uL (ref 0.7–4.0)
MCH: 26.2 pg (ref 26.0–34.0)
MCHC: 30.7 g/dL (ref 30.0–36.0)
MCV: 85.3 fL (ref 80.0–100.0)
Monocytes Absolute: 0.6 10*3/uL (ref 0.1–1.0)
Monocytes Relative: 8 %
Neutro Abs: 4.9 10*3/uL (ref 1.7–7.7)
Neutrophils Relative %: 58 %
Platelets: 267 10*3/uL (ref 150–400)
RBC: 5.73 MIL/uL — ABNORMAL HIGH (ref 3.87–5.11)
RDW: 13.6 % (ref 11.5–15.5)
WBC: 8.4 10*3/uL (ref 4.0–10.5)
nRBC: 0 % (ref 0.0–0.2)

## 2018-04-27 LAB — D-DIMER, QUANTITATIVE: D-Dimer, Quant: 0.27 ug/mL-FEU (ref 0.00–0.50)

## 2018-04-27 LAB — TROPONIN I: Troponin I: <0.03 ng/mL (ref <0.03)

## 2018-04-27 MED ORDER — KETOROLAC TROMETHAMINE 15 MG/ML IJ SOLN
15.0000 mg | Freq: Once | INTRAMUSCULAR | Status: AC
  Administered 2018-04-27: 15 mg via INTRAVENOUS

## 2018-04-27 MED ORDER — ASPIRIN 81 MG PO CHEW
324.0000 mg | CHEWABLE_TABLET | Freq: Once | ORAL | Status: AC
  Administered 2018-04-27: 15:00:00 324 mg via ORAL
  Filled 2018-04-27: qty 4

## 2018-04-27 MED ORDER — KETOROLAC TROMETHAMINE 15 MG/ML IJ SOLN
30.0000 mg | Freq: Once | INTRAMUSCULAR | Status: DC
  Filled 2018-04-27: qty 2

## 2018-04-27 NOTE — ED Triage Notes (Signed)
SOB and chest pain since yesterday, "stabbing" pain radiates to mid back, denies cough

## 2018-04-27 NOTE — ED Notes (Signed)
Patient transported to Ultrasound 

## 2018-04-27 NOTE — ED Notes (Signed)
ED Provider at bedside. 
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
## 2018-04-27 NOTE — Discharge Instructions (Addendum)
Please follow up with your primary care provider within 5-7 days for re-evaluation of your symptoms. If you do not have a primary care provider, information for a healthcare clinic has been provided for you to make arrangements for follow up care. Please return to the emergency department for any new or worsening symptoms. ° °

## 2018-04-30 ENCOUNTER — Other Ambulatory Visit: Payer: Self-pay | Admitting: Student in an Organized Health Care Education/Training Program

## 2018-05-01 ENCOUNTER — Other Ambulatory Visit: Payer: Self-pay | Admitting: Adult Medicine

## 2018-05-01 DIAGNOSIS — Z1231 Encounter for screening mammogram for malignant neoplasm of breast: Secondary | ICD-10-CM

## 2018-06-06 ENCOUNTER — Other Ambulatory Visit: Payer: Self-pay

## 2018-06-06 ENCOUNTER — Encounter: Payer: Self-pay | Admitting: Orthopaedic Surgery

## 2018-06-06 ENCOUNTER — Ambulatory Visit (INDEPENDENT_AMBULATORY_CARE_PROVIDER_SITE_OTHER): Payer: Medicare HMO | Admitting: Orthopaedic Surgery

## 2018-06-06 DIAGNOSIS — M25552 Pain in left hip: Secondary | ICD-10-CM
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
## 2018-06-06 NOTE — Addendum Note (Signed)
Addended by: Penne Lash, Neysa Bonito N on: 06/06/2018 01:59 PM   Modules accepted: Orders

## 2018-06-11 ENCOUNTER — Other Ambulatory Visit (INDEPENDENT_AMBULATORY_CARE_PROVIDER_SITE_OTHER): Payer: Self-pay | Admitting: Physician Assistant

## 2018-06-14 ENCOUNTER — Telehealth: Payer: Self-pay

## 2018-06-14 NOTE — Telephone Encounter (Signed)
Patient would like to get Rf on Meloxicam.  Uses [facility]   CB [phone]

## 2018-06-15 ENCOUNTER — Ambulatory Visit: Payer: Medicare HMO | Admitting: Orthopaedic Surgery

## 2018-06-15 ENCOUNTER — Other Ambulatory Visit: Payer: Self-pay | Admitting: Physician Assistant

## 2018-06-15 MED ORDER — MELOXICAM 7.5 MG PO TABS: ORAL_TABLET | ORAL | 0 refills | Status: DC

## 2018-06-15 NOTE — Telephone Encounter (Signed)
Just sent in

## 2018-06-28 ENCOUNTER — Ambulatory Visit
Admission: RE | Admit: 2018-06-28 | Discharge: 2018-06-28 | Disposition: A | Discharge status: Home / Self Care | Payer: Medicare HMO | Source: Ambulatory Visit | Attending: Adult Medicine | Admitting: Adult Medicine

## 2018-06-28 ENCOUNTER — Other Ambulatory Visit: Payer: Self-pay

## 2018-06-28 DIAGNOSIS — Z1231 Encounter for screening mammogram for malignant neoplasm of breast: Secondary | ICD-10-CM

## 2018-07-10 ENCOUNTER — Ambulatory Visit
Admission: RE | Admit: 2018-07-10 | Discharge: 2018-07-10 | Disposition: A | Discharge status: Home / Self Care | Payer: Medicare HMO | Source: Ambulatory Visit | Attending: Orthopaedic Surgery | Admitting: Orthopaedic Surgery

## 2018-07-10 ENCOUNTER — Other Ambulatory Visit: Payer: Self-pay

## 2018-07-10 DIAGNOSIS — M25552 Pain in left hip: Secondary | ICD-10-CM

## 2018-07-10 MED ORDER — IOPAMIDOL (ISOVUE-M 200) INJECTION 41%
13.0000 mL | Freq: Once | INTRAMUSCULAR | Status: AC
  Administered 2018-07-10: 15:00:00 13 mL via INTRA_ARTICULAR

## 2018-07-12 ENCOUNTER — Telehealth: Payer: Self-pay

## 2018-07-12 NOTE — Telephone Encounter (Signed)
Spoke with the patient and they have given verbal consent to file insurance and to do a mychart video visit. Mychart video link has been sent to the patient.   

## 2018-07-13 ENCOUNTER — Encounter: Payer: Self-pay | Admitting: Orthopaedic Surgery

## 2018-07-13 ENCOUNTER — Ambulatory Visit (INDEPENDENT_AMBULATORY_CARE_PROVIDER_SITE_OTHER): Payer: Medicare HMO | Admitting: Orthopaedic Surgery

## 2018-07-13 ENCOUNTER — Other Ambulatory Visit: Payer: Self-pay

## 2018-07-13 VITALS — Ht 63.0 in | Wt 156.5 lb

## 2018-07-13 DIAGNOSIS — M1612 Unilateral primary osteoarthritis, left hip: Secondary | ICD-10-CM | POA: Diagnosis not present

## 2018-07-13 DIAGNOSIS — S73192A Other sprain of left hip, initial encounter: Secondary | ICD-10-CM
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
## 2018-07-16 ENCOUNTER — Telehealth: Payer: Self-pay | Admitting: Family Medicine

## 2018-07-16 NOTE — Progress Notes (Deleted)
   PATIENT: COUMBA KELLISON DOB: [birth year]  REASON FOR VISIT: follow up HISTORY FROM: patient  Virtual Visit via Telephone Note  I connected with [name] on 07/16/18 at 10:00 AM EDT by telephone and verified that I am speaking with the correct person using two identifiers.   I discussed the limitations, risks, security and privacy concerns of performing an evaluation and management service by telephone and the availability of in person appointments. I also discussed with the patient that there may be a patient responsible charge related to this service. The patient expressed understanding and agreed to proceed.   History of Present Illness:  07/16/18 TATIYANA FOUCHER is a 53 y.o. female here today for follow up.   Observations/Objective:  Generalized: Well developed, in no acute distress  Mentation: Alert oriented to time, place, history taking. Follows all commands speech and language fluent   Assessment and Plan:  53 y.o. year old female  has a past medical history of Blind left eye, Burn, COPD (chronic obstructive pulmonary disease) ([place]), COPD (chronic obstructive pulmonary disease) ([place]), Diabetes mellitus without complication ([place]), Fibromyalgia, Headache, Hypercholesterolemia, and Stroke ([place]) (2014). here with  No diagnosis found.  No orders of the defined types were placed in this encounter.   No orders of the defined types were placed in this encounter.    Follow Up Instructions:  I discussed the assessment and treatment plan with the patient. The patient was provided an opportunity to ask questions and all were answered. The patient agreed with the plan and demonstrated an understanding of the instructions.   The patient was advised to call back or seek an in-person evaluation if the symptoms worsen or if the condition fails to improve as anticipated.  I provided *** minutes of non-face-to-face time during this encounter.   Debbora Presto, NP

## 2018-07-16 NOTE — Telephone Encounter (Signed)
I have attempted to contact patient had numbers provided on chart.  Patient is unable to be reached.  I have left a voicemail for her to call the office to reschedule today's appointment.

## 2018-07-25 ENCOUNTER — Telehealth: Payer: Self-pay | Admitting: *Deleted

## 2018-07-25 NOTE — Telephone Encounter (Addendum)
Received fax from [facility] about [place].  LMVM for pt (checking to see how aimovig is working?) also rescheduling appt for her with AL/NP.

## 2018-09-08 ENCOUNTER — Other Ambulatory Visit: Payer: Self-pay | Admitting: Physician Assistant

## 2018-09-23 IMAGING — CT CT CERVICAL SPINE W/O CM
4 series · 16 of 33 positions shown, 19 images · non-contrast
Comparison: MR 04/29/2015

CLINICAL DATA: Whole left side numbness and tingling per pt.

EXAM:
CT CERVICAL SPINE WITHOUT CONTRAST
TECHNIQUE: Multidetector CT imaging of the cervical spine was performed without
intravenous contrast. Multiplanar CT image reconstructions were also
generated.

[Series 5: c_spine 2.0 st · axial · 0.39mm/px · z∈[-140,-14]mm · 5 of 95 slices shown, 7 images]
[im 16/95  soft-tissue]
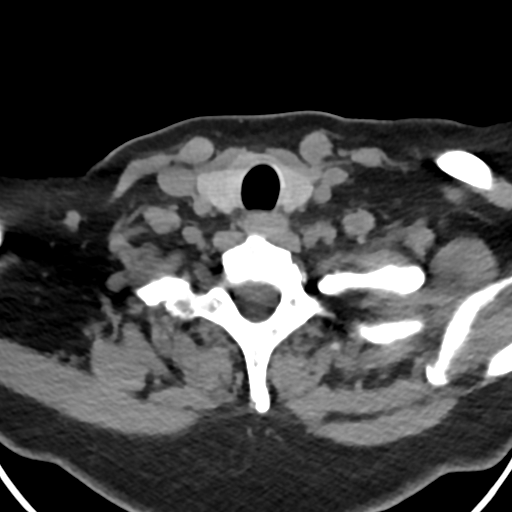
[im 16/95  bone]
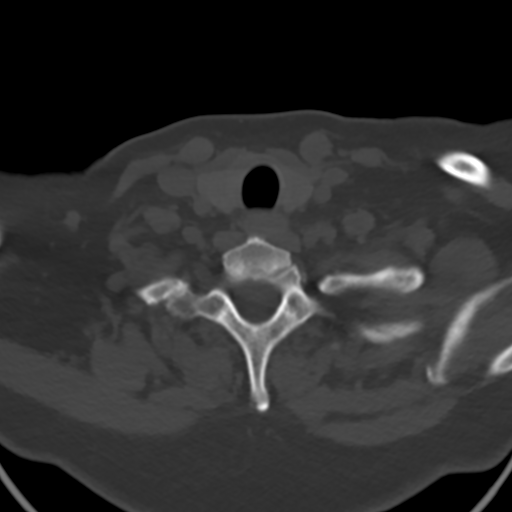
[im 32/95  bone]
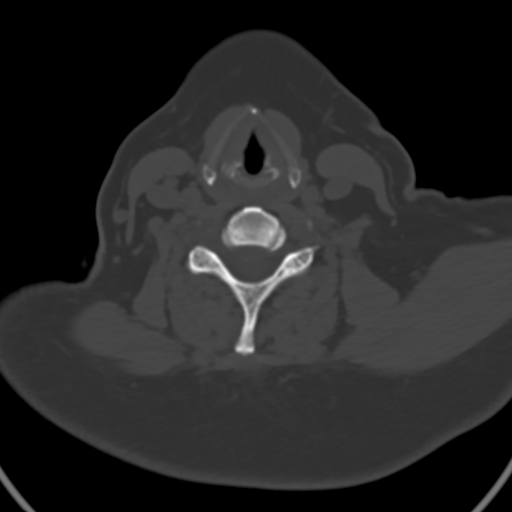
[im 48/95  bone]
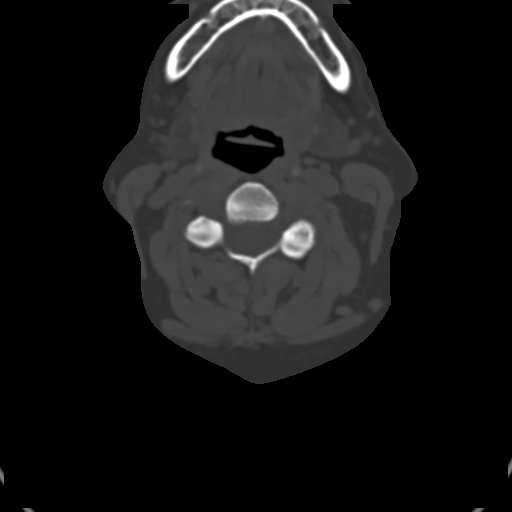
[im 63/95  bone]
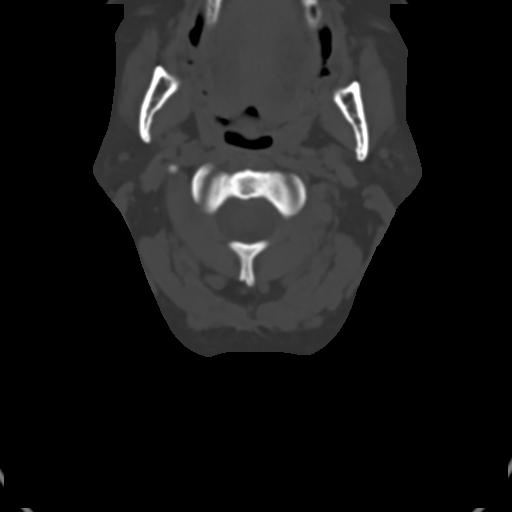
[im 79/95  soft-tissue]
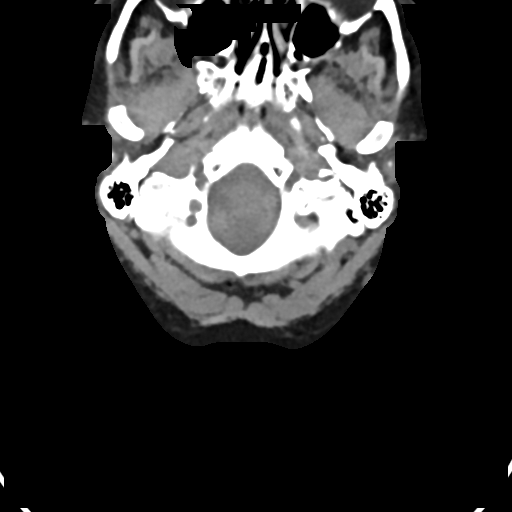
[im 79/95  bone]
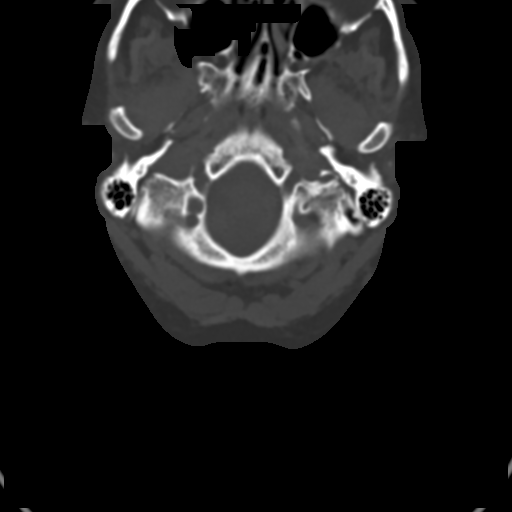

[Series 6: coronal bone · coronal · 0.33mm/px · 3 of 96 slices shown]
[im 20/96  bone]
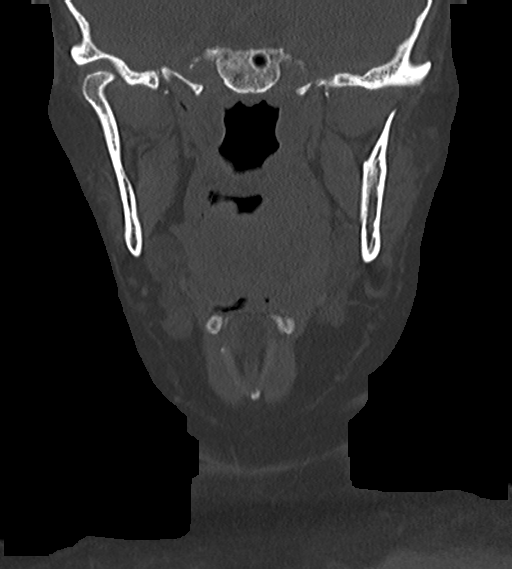
[im 39/96  bone]
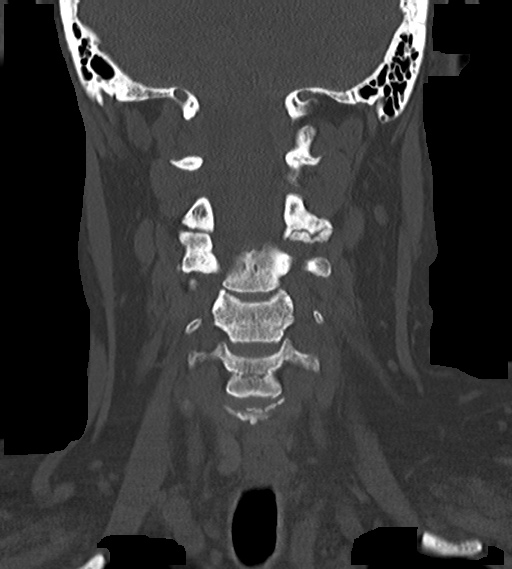
[im 58/96  bone]
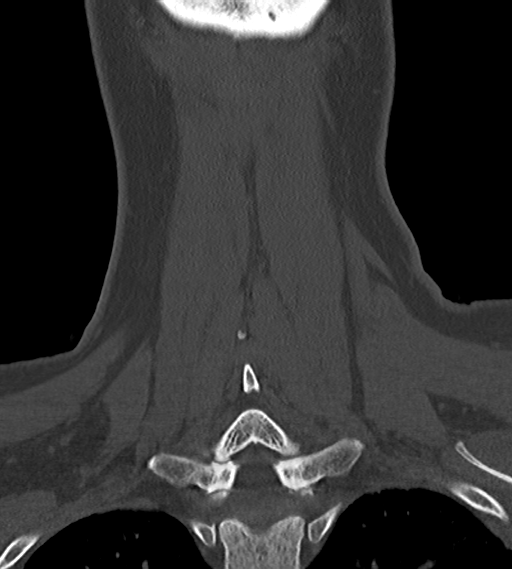

[Series 7: sagittal bone · sagittal · 0.40mm/px · 5 of 87 slices shown, 6 images]
[im 29/87  bone]
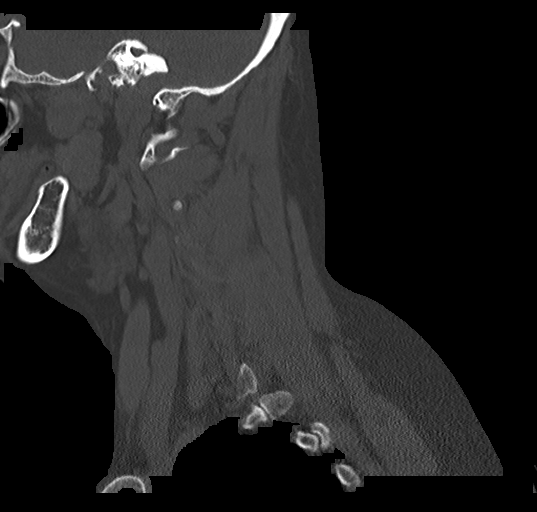
[im 36/87  bone]
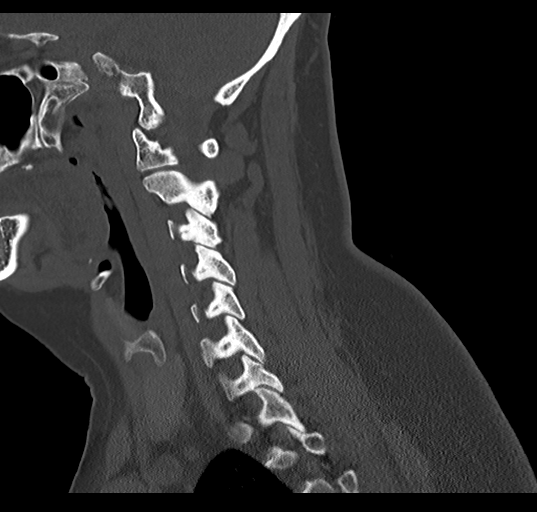
[im 44/87  soft-tissue]
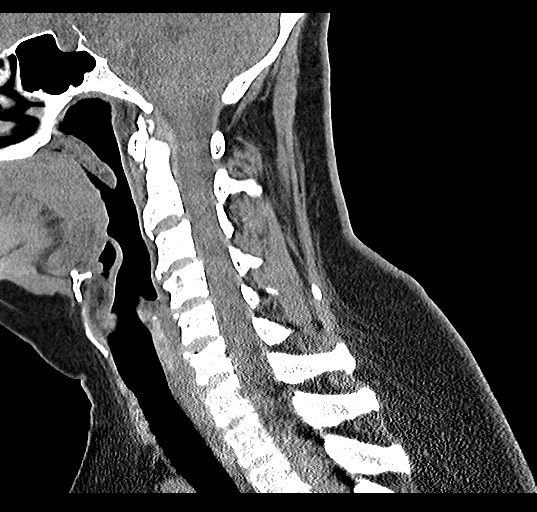
[im 44/87  bone]
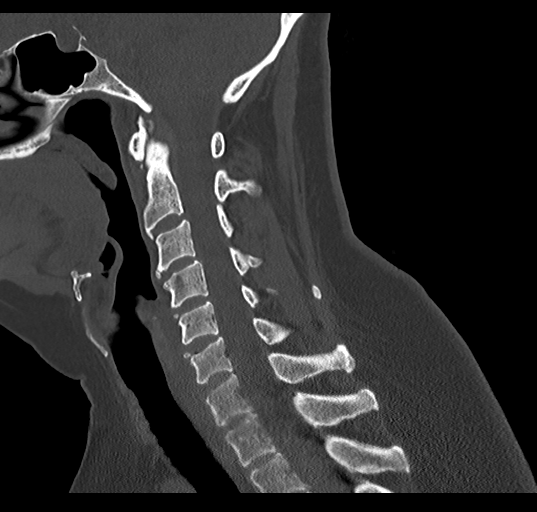
[im 51/87  bone]
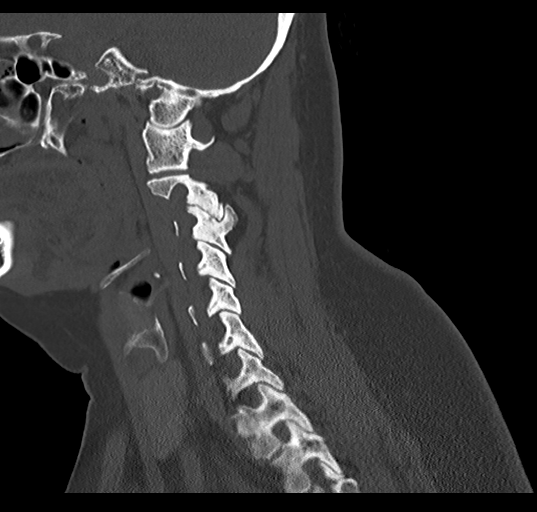
[im 58/87  bone]
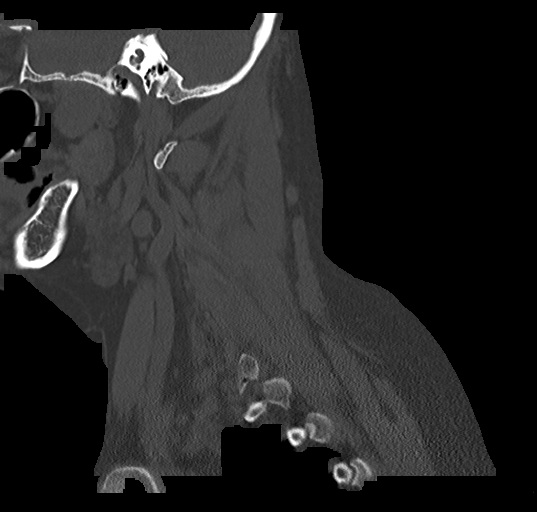

[Series 10: orthogonal st · axial · 0.21mm/px · z∈[-164,-109]mm · 3 of 87 slices shown]
[im 15/87  bone]
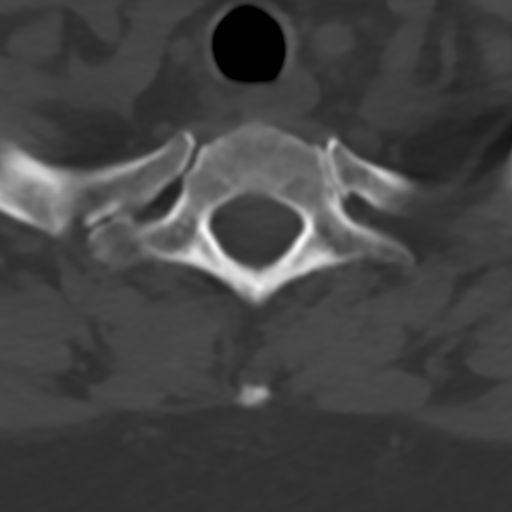
[im 29/87  bone]
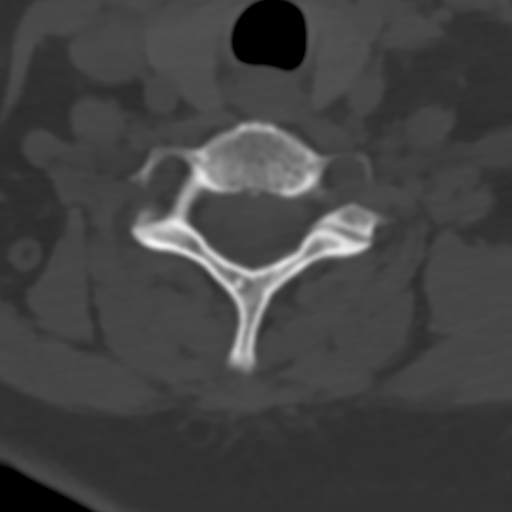
[im 44/87  bone]
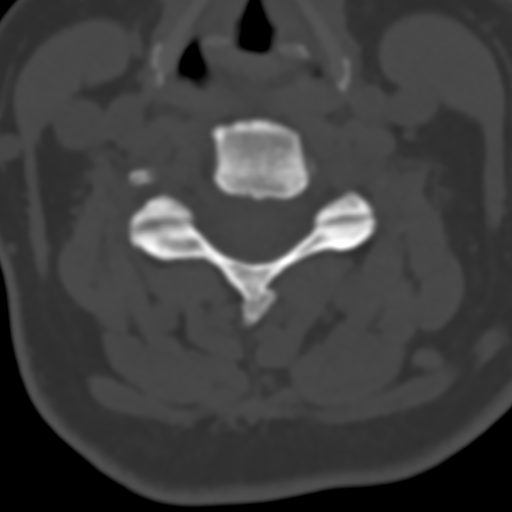

[16 of 33 positions shown; findings below may reference images not displayed]

FINDINGS: Alignment: Normal.

Skull base and vertebrae: No acute fracture. No primary bone lesion
or focal pathologic process.

Soft tissues and spinal canal: No prevertebral fluid or swelling. No
visible canal hematoma. Minimal calcified plaque in the left carotid
bifurcation.

Disc levels: No significant disc narrowing. Small anterior endplate
spurring C3-C7. Facet DJD resulting in early foraminal encroachment
left C2-3.

Upper chest: Negative.

Other: None.
IMPRESSION: 1. Negative for fracture or other acute bone abnormality.
2. Left facet DJD  C2-3.

## 2018-09-26 ENCOUNTER — Emergency Department (HOSPITAL_COMMUNITY): Payer: Medicare Other

## 2018-09-26 ENCOUNTER — Emergency Department (HOSPITAL_COMMUNITY)
Admission: EM | Admit: 2018-09-26 | Discharge: 2018-09-26 | Disposition: A | Discharge status: Home / Self Care | Payer: Medicare Other | Attending: Emergency Medicine | Admitting: Emergency Medicine

## 2018-09-26 ENCOUNTER — Other Ambulatory Visit: Payer: Self-pay

## 2018-09-26 DIAGNOSIS — K760 Fatty (change of) liver, not elsewhere classified: Secondary | ICD-10-CM | POA: Insufficient documentation

## 2018-09-26 DIAGNOSIS — Z79899 Other long term (current) drug therapy: Secondary | ICD-10-CM | POA: Insufficient documentation

## 2018-09-26 DIAGNOSIS — M546 Pain in thoracic spine: Secondary | ICD-10-CM | POA: Insufficient documentation

## 2018-09-26 DIAGNOSIS — E119 Type 2 diabetes mellitus without complications: Secondary | ICD-10-CM | POA: Insufficient documentation

## 2018-09-26 DIAGNOSIS — W19XXXA Unspecified fall, initial encounter: Secondary | ICD-10-CM

## 2018-09-26 DIAGNOSIS — Y939 Activity, unspecified: Secondary | ICD-10-CM | POA: Diagnosis not present

## 2018-09-26 DIAGNOSIS — J449 Chronic obstructive pulmonary disease, unspecified: Secondary | ICD-10-CM | POA: Insufficient documentation

## 2018-09-26 DIAGNOSIS — M542 Cervicalgia: Secondary | ICD-10-CM | POA: Insufficient documentation

## 2018-09-26 DIAGNOSIS — S7002XA Contusion of left hip, initial encounter: Secondary | ICD-10-CM | POA: Diagnosis not present

## 2018-09-26 DIAGNOSIS — Y999 Unspecified external cause status: Secondary | ICD-10-CM | POA: Diagnosis not present

## 2018-09-26 DIAGNOSIS — Y929 Unspecified place or not applicable: Secondary | ICD-10-CM | POA: Diagnosis not present

## 2018-09-26 DIAGNOSIS — R0789 Other chest pain: Secondary | ICD-10-CM | POA: Insufficient documentation

## 2018-09-26 DIAGNOSIS — S300XXA Contusion of lower back and pelvis, initial encounter: Secondary | ICD-10-CM | POA: Insufficient documentation

## 2018-09-26 DIAGNOSIS — T07XXXA Unspecified multiple injuries, initial encounter: Secondary | ICD-10-CM | POA: Diagnosis present

## 2018-09-26 DIAGNOSIS — T1490XA Injury, unspecified, initial encounter: Secondary | ICD-10-CM

## 2018-09-26 DIAGNOSIS — I10 Essential (primary) hypertension: Secondary | ICD-10-CM | POA: Diagnosis not present

## 2018-09-26 DIAGNOSIS — M545 Low back pain: Secondary | ICD-10-CM | POA: Diagnosis not present

## 2018-09-26 DIAGNOSIS — Z794 Long term (current) use of insulin: Secondary | ICD-10-CM | POA: Insufficient documentation

## 2018-09-26 DIAGNOSIS — Z7982 Long term (current) use of aspirin: Secondary | ICD-10-CM | POA: Diagnosis not present

## 2018-09-26 DIAGNOSIS — W109XXA Fall (on) (from) unspecified stairs and steps, initial encounter: Secondary | ICD-10-CM | POA: Diagnosis not present

## 2018-09-26 DIAGNOSIS — R51 Headache: Secondary | ICD-10-CM | POA: Insufficient documentation

## 2018-09-26 DIAGNOSIS — R000 Tachycardia, unspecified: Secondary | ICD-10-CM | POA: Diagnosis not present

## 2018-09-26 DIAGNOSIS — M549 Dorsalgia, unspecified: Secondary | ICD-10-CM

## 2018-09-26 LAB — TROPONIN I (HIGH SENSITIVITY)
Troponin I (High Sensitivity): 4 ng/L (ref <18)
Troponin I (High Sensitivity): 3 ng/L (ref <18)

## 2018-09-26 LAB — I-STAT CHEM 8, ED
BUN: 13 mg/dL (ref 6–20)
Calcium, Ion: 1.12 mmol/L — ABNORMAL LOW (ref 1.15–1.40)
Chloride: 105 mmol/L (ref 98–111)
Creatinine, Ser: 0.8 mg/dL (ref 0.44–1.00)
Glucose, Bld: 203 mg/dL — ABNORMAL HIGH (ref 70–99)
HCT: 43 % (ref 36.0–46.0)
Hemoglobin: 14.6 g/dL (ref 12.0–15.0)
Potassium: 3.9 mmol/L (ref 3.5–5.1)
Sodium: 138 mmol/L (ref 135–145)
TCO2: 19 mmol/L — ABNORMAL LOW (ref 22–32)

## 2018-09-26 LAB — CBC
HCT: 44.7 % (ref 36.0–46.0)
Hemoglobin: 14.3 g/dL (ref 12.0–15.0)
MCH: 26.6 pg (ref 26.0–34.0)
MCHC: 32 g/dL (ref 30.0–36.0)
MCV: 83.1 fL (ref 80.0–100.0)
Platelets: 250 10*3/uL (ref 150–400)
RBC: 5.38 MIL/uL — ABNORMAL HIGH (ref 3.87–5.11)
RDW: 13.2 % (ref 11.5–15.5)
WBC: 11 10*3/uL — ABNORMAL HIGH (ref 4.0–10.5)
nRBC: 0 % (ref 0.0–0.2)

## 2018-09-26 LAB — ETHANOL: Alcohol, Ethyl (B): <10 mg/dL (ref <10)

## 2018-09-26 LAB — URINALYSIS, ROUTINE W REFLEX MICROSCOPIC
Bacteria, UA: NONE SEEN
Bilirubin Urine: NEGATIVE
Glucose, UA: >=500 mg/dL — AB
Hgb urine dipstick: NEGATIVE
Ketones, ur: NEGATIVE mg/dL
Leukocytes,Ua: NEGATIVE
Nitrite: NEGATIVE
Protein, ur: NEGATIVE mg/dL
Specific Gravity, Urine: 1.007 (ref 1.005–1.030)
pH: 6 (ref 5.0–8.0)

## 2018-09-26 LAB — RAPID URINE DRUG SCREEN, HOSP PERFORMED
Amphetamines: NOT DETECTED
Barbiturates: NOT DETECTED
Benzodiazepines: NOT DETECTED
Cocaine: NOT DETECTED
Opiates: POSITIVE — AB
Tetrahydrocannabinol: NOT DETECTED

## 2018-09-26 LAB — I-STAT BETA HCG BLOOD, ED (MC, WL, AP ONLY): I-stat hCG, quantitative: <5 m[IU]/mL (ref <5)

## 2018-09-26 LAB — COMPREHENSIVE METABOLIC PANEL
ALT: 37 U/L (ref 0–44)
AST: 29 U/L (ref 15–41)
Albumin: 4.1 g/dL (ref 3.5–5.0)
Alkaline Phosphatase: 77 U/L (ref 38–126)
Anion gap: 12 (ref 5–15)
BUN: 13 mg/dL (ref 6–20)
CO2: 20 mmol/L — ABNORMAL LOW (ref 22–32)
Calcium: 9.3 mg/dL (ref 8.9–10.3)
Chloride: 106 mmol/L (ref 98–111)
Creatinine, Ser: 0.95 mg/dL (ref 0.44–1.00)
GFR calc Af Amer: 42 mL/min — ABNORMAL LOW (ref >60)
GFR calc non Af Amer: 36 mL/min — ABNORMAL LOW (ref >60)
Glucose, Bld: 206 mg/dL — ABNORMAL HIGH (ref 70–99)
Potassium: 3.9 mmol/L (ref 3.5–5.1)
Sodium: 138 mmol/L (ref 135–145)
Total Bilirubin: 0.3 mg/dL (ref 0.3–1.2)
Total Protein: 7.1 g/dL (ref 6.5–8.1)

## 2018-09-26 LAB — LACTIC ACID, PLASMA: Lactic Acid, Venous: 2.5 mmol/L (ref 0.5–1.9)

## 2018-09-26 LAB — CDS SEROLOGY

## 2018-09-26 MED ORDER — IOHEXOL 350 MG/ML SOLN
80.0000 mL | Freq: Once | INTRAVENOUS | Status: AC | PRN
  Administered 2018-09-26: 17:00:00 80 mL via INTRAVENOUS

## 2018-09-26 MED ORDER — MORPHINE SULFATE (PF) 4 MG/ML IV SOLN
4.0000 mg | Freq: Once | INTRAVENOUS | Status: AC
  Administered 2018-09-26: 4 mg via INTRAVENOUS
  Filled 2018-09-26: qty 1

## 2018-09-26 MED ORDER — SODIUM CHLORIDE 0.9 % IV BOLUS
1000.0000 mL | Freq: Once | INTRAVENOUS | Status: AC
  Administered 2018-09-26: 18:00:00 1000 mL via INTRAVENOUS

## 2018-09-26 MED ORDER — SODIUM CHLORIDE 0.9 % IV BOLUS
1000.0000 mL | Freq: Once | INTRAVENOUS | Status: AC
  Administered 2018-09-26: 1000 mL via INTRAVENOUS

## 2018-09-26 MED ORDER — LORAZEPAM 2 MG/ML IJ SOLN
1.0000 mg | Freq: Once | INTRAMUSCULAR | Status: AC
  Administered 2018-09-26: 18:00:00 1 mg via INTRAVENOUS
  Filled 2018-09-26: qty 1

## 2018-09-26 MED ORDER — CYCLOBENZAPRINE HCL 10 MG PO TABS: 10.0000 mg | ORAL_TABLET | Freq: Two times a day (BID) | ORAL | 0 refills | Status: DC | PRN

## 2018-09-26 NOTE — ED Provider Notes (Signed)
[facility] Provider Note   CSN: [id number] Arrival date & time: 09/26/18  1230     History   Chief Complaint Chief Complaint  Patient presents with   Fall    HPI Annette ParentsDoris S Norman is a 53 y.o. female.     HPI   53 year old female who presents as a level 2 trauma after a fall down 4 stairs with tachycardia noted in route.  Patient felt like she stepped down and her left leg gave out, falling down 4 stairs, hitting her back and her neck.  Reports she hit her head on a banister.  Describes headache, neck pain middle and low back pain as well as left buttock/hip pain.  Denies numbness. She was able to get up but was not able to stand or ambulate because of pain to the left side.   Denies hitting her chest when she fell, but does report chest pressure developing afterwards.  Denies shortness of breath, nausea, vomiting, diarrhea, black or bloody stools.  Denies recent illness including no fever, cough, urinary symptoms.  Pain is severe.  Worse with movement.  No hx of elevated heart rate in the past.     PMH  COPD, DM, hldp, htn, OSA, fibromyalgia  OB History   No obstetric history on file.      Home Medications    Prior to Admission medications   Medication Sig Start Date End Date Taking? Authorizing Provider  AIMOVIG 140 MG/ML SOAJ Inject 140 mg into the skin every 30 (thirty) days. 04/25/18  Yes [provider]  ANORO ELLIPTA 62.5-25 MCG/INH AEPB Inhale 1 puff into the lungs daily. 08/17/18  Yes [provider]  aspirin EC 81 MG tablet Take 81 mg by mouth at bedtime.   Yes [provider]  brimonidine-timolol (COMBIGAN) 0.2-0.5 % ophthalmic solution Place 1 drop into the left eye at bedtime.   Yes [provider]  busPIRone (BUSPAR) 10 MG tablet Take 10 mg by mouth at bedtime.   Yes [provider]  cholecalciferol (VITAMIN D3) 25 MCG (1000 UT) tablet Take 1,000 Units by mouth daily.   Yes  [provider]  DEXILANT 60 MG capsule Take 1 capsule by mouth at bedtime. 09/08/18  Yes [provider]  FLUoxetine (PROZAC) 40 MG capsule Take 40 mg by mouth at bedtime.   Yes [provider]  fluticasone furoate-vilanterol (BREO ELLIPTA) 200-25 MCG/INH AEPB Inhale 1 puff into the lungs at bedtime.   Yes [provider]  folic acid (FOLVITE) 1 MG tablet Take 1 mg by mouth daily.   Yes [provider]  gabapentin (NEURONTIN) 300 MG capsule Take 300 mg by mouth 2 (two) times daily. 08/27/18  Yes [provider]  HYDROcodone-acetaminophen (NORCO) 10-325 MG tablet Take 1 tablet by mouth every 6 (six) hours as needed for moderate pain.   Yes [provider]  insulin detemir (LEVEMIR) 100 UNIT/ML injection Inject 15 Units into the skin at bedtime.   Yes [provider]  INVOKAMET 50-1000 MG TABS Take 1 tablet by mouth 2 (two) times daily. 08/27/18  Yes [provider]  linaclotide (LINZESS) 145 MCG CAPS capsule Take 145 mcg by mouth at bedtime.   Yes [provider]  loratadine (CLARITIN) 10 MG tablet Take 10 mg by mouth daily.   Yes [provider]  meloxicam (MOBIC) 7.5 MG tablet Take 7.5 mg by mouth daily.   Yes [provider]  methocarbamol (ROBAXIN) 500 MG  tablet Take 500 mg by mouth daily as needed for muscle spasms.   Yes [provider]  omeprazole (PRILOSEC) 40 MG capsule Take 40 mg by mouth daily.   Yes [provider]  pregabalin (LYRICA) 75 MG capsule Take 75 mg by mouth daily.   Yes [provider]  ranitidine (ZANTAC) 150 MG capsule Take 150 mg by mouth at bedtime.   Yes [provider]  rizatriptan (MAXALT) 10 MG tablet Take 10 mg by mouth as needed for migraine. May repeat in 2 hours if needed   Yes [provider]  rosuvastatin (CRESTOR) 40 MG tablet Take 40 mg by mouth at bedtime.   Yes [provider]  SAVELLA 50 MG TABS tablet  Take 50 mg by mouth 2 (two) times daily. 08/27/18  Yes [provider]  topiramate (TOPAMAX) 50 MG tablet Take 50 mg by mouth daily.   Yes [provider]  travoprost, benzalkonium, (TRAVATAN) 0.004 % ophthalmic solution Place 1 drop into the left eye at bedtime.   Yes [provider]  trimethoprim-polymyxin b (POLYTRIM) ophthalmic solution Place 1 drop into the left eye at bedtime.   Yes [provider]  TRULANCE 3 MG TABS Take 1 tablet by mouth daily. 08/06/18  Yes [provider]  vitamin B-12 (CYANOCOBALAMIN) 100 MCG tablet Take 100 mcg by mouth at bedtime.   Yes [provider]  cyclobenzaprine (FLEXERIL) 10 MG tablet Take 1 tablet (10 mg total) by mouth 2 (two) times daily as needed for muscle spasms. 09/26/18   Melene Plan, DO    Family History No family history on file.  Social History Social History   Tobacco Use   Smoking status: Not on file  Substance Use Topics   Alcohol use: Not on file   Drug use: Not on file     Allergies   Bee venom, Amitriptyline, and Nortriptyline   Review of Systems Review of Systems  Constitutional: Negative for fever.  HENT: Negative for sore throat.   Eyes: Negative for visual disturbance.  Respiratory: Negative for cough and shortness of breath.   Cardiovascular: Negative for chest pain.  Gastrointestinal: Negative for abdominal pain, nausea and vomiting.  Genitourinary: Negative for difficulty urinating.  Musculoskeletal: Positive for back pain and neck pain.  Skin: Negative for rash.  Neurological: Positive for weakness (reports left leg weak but feels like it is pain) and headaches. Negative for syncope and numbness.     Physical Exam Updated Vital Signs BP (!) 105/59    Pulse (!) 106    Temp 98.6 F (37 C) (Oral)    Resp 19    Ht 5\' 6"  (1.676 m)    Wt 72.6 kg    SpO2 93%    BMI 25.82 kg/m   Physical Exam Vitals signs and nursing note reviewed.  Constitutional:       General: She is not in acute distress.    Appearance: She is well-developed. She is not diaphoretic.  HENT:     Head: Normocephalic and atraumatic.  Eyes:     Conjunctiva/sclera: Conjunctivae normal.  Neck:     Musculoskeletal: Normal range of motion.  Cardiovascular:     Rate and Rhythm: Regular rhythm. Tachycardia present.     Pulses: Normal pulses.     Heart sounds: Normal heart sounds. No murmur. No friction rub. No gallop.   Pulmonary:     Effort: Pulmonary effort is normal. No respiratory distress.     Breath sounds: Normal  breath sounds. No wheezing or rales.  Abdominal:     General: There is no distension.     Palpations: Abdomen is soft.     Tenderness: There is no abdominal tenderness. There is no guarding.  Musculoskeletal:        General: Tenderness (cervical spine, mid thoracic, lumbar tenderness, contusion and tenderness left buttock) present.  Skin:    General: Skin is warm and dry.     Findings: Bruising present. No erythema or rash.  Neurological:     Mental Status: She is alert and oriented to person, place, and time.     GCS: GCS eye subscore is 4. GCS verbal subscore is 5. GCS motor subscore is 6.     Cranial Nerves: Cranial nerves are intact. No cranial nerve deficit, dysarthria or facial asymmetry.     Sensory: Sensation is intact. No sensory deficit.     Motor: Motor function is intact. Weakness: no weakness, but LLE with pain with movements limiting strength exam.     Coordination: Coordination is intact. Finger-Nose-Finger Test normal.      ED Treatments / Results  Labs (all labs ordered are listed, but only abnormal results are displayed) Labs Reviewed  COMPREHENSIVE METABOLIC PANEL - Abnormal; Notable for the following components:      Result Value   CO2 20 (*)    Glucose, Bld 206 (*)    GFR calc non Af Amer 36 (*)    GFR calc Af Amer 42 (*)    All other components within normal limits  CBC - Abnormal; Notable for the following components:    WBC 11.0 (*)    RBC 5.38 (*)    All other components within normal limits  URINALYSIS, ROUTINE W REFLEX MICROSCOPIC - Abnormal; Notable for the following components:   Color, Urine STRAW (*)    Glucose, UA >=500 (*)    All other components within normal limits  LACTIC ACID, PLASMA - Abnormal; Notable for the following components:   Lactic Acid, Venous 2.5 (*)    All other components within normal limits  RAPID URINE DRUG SCREEN, HOSP PERFORMED - Abnormal; Notable for the following components:   Opiates POSITIVE (*)    All other components within normal limits  I-STAT CHEM 8, ED - Abnormal; Notable for the following components:   Glucose, Bld 203 (*)    Calcium, Ion 1.12 (*)    TCO2 19 (*)    All other components within normal limits  ETHANOL  CDS SEROLOGY  I-STAT BETA HCG BLOOD, ED ([facility], [facility], [facility] ONLY)  SAMPLE TO BLOOD BANK  TROPONIN I (HIGH SENSITIVITY)  TROPONIN I (HIGH SENSITIVITY)    EKG EKG Interpretation  Date/Time:  Wednesday September 26 2018 12:44:15 EDT Ventricular Rate:  125 PR Interval:    QRS Duration: 82 QT Interval:  314 QTC Calculation: 453 R Axis:   90 Text Interpretation:  Sinus tachycardia Borderline right axis deviation No previous ECGs available Confirmed by Alvira MondaySchlossman, Leza Apsey ([id number]) on 09/26/2018 3:20:36 PM   Radiology Ct Head Wo Contrast  Result Date: 09/26/2018 CLINICAL DATA:  53 year old female with fall. EXAM: CT HEAD WITHOUT CONTRAST CT CERVICAL SPINE WITHOUT CONTRAST TECHNIQUE: Multidetector CT imaging of the head and cervical spine was performed following the standard protocol without intravenous contrast. Multiplanar CT image reconstructions of the cervical spine were also generated. COMPARISON:  CT of the cervical spine dated 09/17/2016 FINDINGS: CT HEAD FINDINGS Brain: The ventricles and sulci appropriate size for patient's age. Confluent white matter  hypodensities most consistent with chronic microvascular ischemic changes or possibly demyelinating  disease. Clinical correlation is recommended. 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 There is no acute intracranial hemorrhage. No mass effect or midline shift. No extra-axial fluid collection. Vascular: No hyperdense vessel or unexpected calcification. Skull: Normal. Negative for fracture or focal lesion. Sinuses/Orbits: The visualized paranasal sinuses and mastoid air cells are clear. Surgical changes of the left globe noted. Other: None CT CERVICAL SPINE FINDINGS Alignment: No acute subluxation. Skull base and vertebrae: No acute fracture. Soft tissues and spinal canal: No prevertebral fluid or swelling. No visible canal hematoma. Disc levels: Multilevel degenerative changes with endplate irregularity and old fragments or calcification along the anterior longitudinal ligament similar to prior CT. Upper chest: Negative. Other: Left carotid bulb calcified plaques. IMPRESSION: 1. No acute intracranial hemorrhage. 2. Age-related atrophy and chronic microvascular ischemic changes. 3. No acute/traumatic cervical spine pathology. Electronically Signed   By: Elgie Collard M.D.   On: 09/26/2018 17:33   Ct Angio Chest Pe W And/or Wo Contrast  Result Date: 09/26/2018 CLINICAL DATA:  Status post fall with diffuse pain. EXAM: CT ANGIOGRAPHY CHEST CT ABDOMEN AND PELVIS WITH CONTRAST TECHNIQUE: Multidetector CT imaging of the chest was performed using the standard protocol during bolus administration of intravenous contrast. Multiplanar CT image reconstructions and MIPs were obtained to evaluate the vascular anatomy. Multidetector CT imaging of the abdomen and pelvis was performed using the standard protocol during bolus administration of intravenous contrast. CONTRAST:  80mL OMNIPAQUE IOHEXOL 350 MG/ML SOLN COMPARISON:  Chest CT January 12, 2018 FINDINGS: CTA CHEST FINDINGS Cardiovascular: Preferential opacification of the thoracic aorta. No evidence of thoracic aortic aneurysm or dissection. Normal heart  size. No pericardial effusion. Calcific atherosclerotic disease of the coronary arteries. Mediastinum/Nodes: No enlarged mediastinal, hilar, or axillary lymph nodes. Thyroid gland, trachea, and esophagus demonstrate no significant findings. Lungs/Pleura: Lungs are clear. No pleural effusion or pneumothorax. Tiny pulmonary nodules, previously described, are stable. Musculoskeletal: No chest wall abnormality. No acute or significant osseous findings. Review of the MIP images confirms the above findings. CT ABDOMEN and PELVIS FINDINGS Hepatobiliary: Hepatic steatosis. Post cholecystectomy. No complicating features. Pancreas: Unremarkable. No pancreatic ductal dilatation or surrounding inflammatory changes. Spleen: Normal in size without focal abnormality. Adrenals/Urinary Tract: Normal appearance of the adrenal glands, left kidney, ureters and urinary bladder. Cortical scarring of the right kidney. Stomach/Bowel: Stomach is within normal limits. Appendix appears normal. No evidence of bowel wall thickening, distention, or inflammatory changes. Vascular/Lymphatic: Aortic atherosclerosis, mild. No enlarged abdominal or pelvic lymph nodes. Reproductive: Status post hysterectomy. No adnexal masses. Other: No abdominal wall hernia or abnormality. No abdominopelvic ascites. Hematoma of the left more than right lower back and buttocks. Musculoskeletal: No fracture is seen. Review of the MIP images confirms the above findings. IMPRESSION: 1. No evidence of acute traumatic injury to the chest, abdomen or pelvis. 2. Calcific atherosclerotic disease of the coronary arteries. 3. Hepatic steatosis. 4. Hematoma of the left more than right lower back and buttocks. Electronically Signed   By: Ted Mcalpine M.D.   On: 09/26/2018 17:34   Ct Cervical Spine Wo Contrast  Result Date: 09/26/2018 CLINICAL DATA:  53 year old female with fall. EXAM: CT HEAD WITHOUT CONTRAST CT CERVICAL SPINE WITHOUT CONTRAST TECHNIQUE: Multidetector  CT imaging of the head and cervical spine was performed following the standard protocol without intravenous contrast. Multiplanar CT image reconstructions of the cervical spine were also generated. COMPARISON:  CT of the cervical spine dated 09/17/2016 FINDINGS: CT  HEAD FINDINGS Brain: The ventricles and sulci appropriate size for patient's age. Confluent white matter hypodensities most consistent with chronic microvascular ischemic changes or possibly demyelinating disease. Clinical correlation is recommended. 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 There is no acute intracranial hemorrhage. No mass effect or midline shift. No extra-axial fluid collection. Vascular: No hyperdense vessel or unexpected calcification. Skull: Normal. Negative for fracture or focal lesion. Sinuses/Orbits: The visualized paranasal sinuses and mastoid air cells are clear. Surgical changes of the left globe noted. Other: None CT CERVICAL SPINE FINDINGS Alignment: No acute subluxation. Skull base and vertebrae: No acute fracture. Soft tissues and spinal canal: No prevertebral fluid or swelling. No visible canal hematoma. Disc levels: Multilevel degenerative changes with endplate irregularity and old fragments or calcification along the anterior longitudinal ligament similar to prior CT. Upper chest: Negative. Other: Left carotid bulb calcified plaques. IMPRESSION: 1. No acute intracranial hemorrhage. 2. Age-related atrophy and chronic microvascular ischemic changes. 3. No acute/traumatic cervical spine pathology. Electronically Signed   By: Elgie Collard M.D.   On: 09/26/2018 17:33   Ct Abdomen Pelvis W Contrast  Result Date: 09/26/2018 CLINICAL DATA:  Status post fall with diffuse pain. EXAM: CT ANGIOGRAPHY CHEST CT ABDOMEN AND PELVIS WITH CONTRAST TECHNIQUE: Multidetector CT imaging of the chest was performed using the standard protocol during bolus administration of intravenous contrast. Multiplanar CT image  reconstructions and MIPs were obtained to evaluate the vascular anatomy. Multidetector CT imaging of the abdomen and pelvis was performed using the standard protocol during bolus administration of intravenous contrast. CONTRAST:  53mL OMNIPAQUE IOHEXOL 350 MG/ML SOLN COMPARISON:  Chest CT January 12, 2018 FINDINGS: CTA CHEST FINDINGS Cardiovascular: Preferential opacification of the thoracic aorta. No evidence of thoracic aortic aneurysm or dissection. Normal heart size. No pericardial effusion. Calcific atherosclerotic disease of the coronary arteries. Mediastinum/Nodes: No enlarged mediastinal, hilar, or axillary lymph nodes. Thyroid gland, trachea, and esophagus demonstrate no significant findings. Lungs/Pleura: Lungs are clear. No pleural effusion or pneumothorax. Tiny pulmonary nodules, previously described, are stable. Musculoskeletal: No chest wall abnormality. No acute or significant osseous findings. Review of the MIP images confirms the above findings. CT ABDOMEN and PELVIS FINDINGS Hepatobiliary: Hepatic steatosis. Post cholecystectomy. No complicating features. Pancreas: Unremarkable. No pancreatic ductal dilatation or surrounding inflammatory changes. Spleen: Normal in size without focal abnormality. Adrenals/Urinary Tract: Normal appearance of the adrenal glands, left kidney, ureters and urinary bladder. Cortical scarring of the right kidney. Stomach/Bowel: Stomach is within normal limits. Appendix appears normal. No evidence of bowel wall thickening, distention, or inflammatory changes. Vascular/Lymphatic: Aortic atherosclerosis, mild. No enlarged abdominal or pelvic lymph nodes. Reproductive: Status post hysterectomy. No adnexal masses. Other: No abdominal wall hernia or abnormality. No abdominopelvic ascites. Hematoma of the left more than right lower back and buttocks. Musculoskeletal: No fracture is seen. Review of the MIP images confirms the above findings. IMPRESSION: 1. No evidence of acute  traumatic injury to the chest, abdomen or pelvis. 2. Calcific atherosclerotic disease of the coronary arteries. 3. Hepatic steatosis. 4. Hematoma of the left more than right lower back and buttocks. Electronically Signed   By: Ted Mcalpine M.D.   On: 09/26/2018 17:34   Dg Pelvis Portable  Result Date: 09/26/2018 CLINICAL DATA:  Pelvic injury from fall.  Initial encounter. EXAM: PORTABLE PELVIS 1-2 VIEWS COMPARISON:  None. FINDINGS: No acute fracture or diastasis. Mild degenerative changes in the hips are present. No focal bony lesions are identified. IMPRESSION: No acute bony abnormality. Electronically Signed   By: Tinnie Gens  Hu M.D.   On: 09/26/2018 12:56   Ct T-spine No Charge  Result Date: 09/26/2018 CLINICAL DATA:  Low back pain post fall. EXAM: CT THORACIC SPINE WITHOUT CONTRAST TECHNIQUE: Multidetector CT images of the thoracic were obtained using the standard protocol without intravenous contrast. COMPARISON:  None. FINDINGS: Alignment: Normal. Vertebrae: No acute fracture or focal pathologic process. Paraspinal and other soft tissues: Negative. Disc levels: Normal. IMPRESSION: No evidence of acute traumatic injury to the thoracic spine. Electronically Signed   By: Ted Mcalpine M.D.   On: 09/26/2018 17:49   Ct L-spine No Charge  Result Date: 09/26/2018 CLINICAL DATA:  Status post fall with back pain. EXAM: CT LUMBAR SPINE WITHOUT CONTRAST TECHNIQUE: Multidetector CT imaging of the lumbar spine was performed without intravenous contrast administration. Multiplanar CT image reconstructions were also generated. COMPARISON:  None. FINDINGS: Segmentation: 5 lumbar type vertebrae. Alignment: Normal. Vertebrae: No acute fracture or focal pathologic process. Paraspinal and other soft tissues: Negative. Disc levels: Normal. IMPRESSION: No evidence of acute traumatic injury to the lumbar spine. Electronically Signed   By: Ted Mcalpine M.D.   On: 09/26/2018 17:38   Dg Chest Portable 1  View  Result Date: 09/26/2018 CLINICAL DATA:  Chest injury following fall.  Tachycardia. EXAM: PORTABLE CHEST 1 VIEW COMPARISON:  None. FINDINGS: The cardiomediastinal silhouette is unremarkable. There is no evidence of focal airspace disease, pulmonary edema, suspicious pulmonary nodule/mass, pleural effusion, or pneumothorax. No acute bony abnormalities are identified. IMPRESSION: No active disease. Electronically Signed   By: Harmon Pier M.D.   On: 09/26/2018 12:55    Procedures .Critical Care Performed by: Alvira Monday, MD Authorized by: Alvira Monday, MD   Critical care provider statement:    Critical care time (minutes):  30   Critical care was necessary to treat or prevent imminent or life-threatening deterioration of the following conditions:  Trauma   Critical care was time spent personally by me on the following activities:  Evaluation of patient's response to treatment, examination of patient, ordering and performing treatments and interventions, ordering and review of laboratory studies, ordering and review of radiographic studies, pulse oximetry, re-evaluation of patient's condition, obtaining history from patient or surrogate and review of old charts   (including critical care time)  Medications Ordered in ED Medications  sodium chloride 0.9 % bolus 1,000 mL (0 mLs Intravenous Stopped 09/26/18 1350)  morphine 4 MG/ML injection 4 mg (4 mg Intravenous Given 09/26/18 1329)  sodium chloride 0.9 % bolus 1,000 mL (0 mLs Intravenous Stopped 09/26/18 1904)  LORazepam (ATIVAN) injection 1 mg (1 mg Intravenous Given 09/26/18 1743)  iohexol (OMNIPAQUE) 350 MG/ML injection 80 mL (80 mLs Intravenous Contrast Given 09/26/18 1708)     Initial Impression / Assessment and Plan / ED Course  I have reviewed the triage vital signs and the nursing notes.  Pertinent labs & imaging results that were available during my care of the patient were reviewed by me and considered in my medical decision  making (see chart for details).        53 year old female with history of DM, htn, hlpd, COPD, fibromyalgia, OSA, who presents as a level 2 trauma after a fall down 4 stairs with tachycardia noted in route. Patient arrives alert, oriented after fall.  Blood pressure stable, however patient with sinus tachycardia to 130s.  Etiologies of her sinus tachycardia include pain and anxiety, underlying medical problem, or result of trauma.  She does not have any signs of intrathoracic, intraabdominal injuries.   XR  chest and pelvis completed given CP and hip pain and show no evidence of abnormalities.  I do not suspect CVA as etiology of fall as her neuro exam is normal with the exception of left leb pain which is worse   CT head, cervical spine, lumbar and thoracic spine ordered.  Labs obtained show no significant abnormalities. Troponin negative, doubt ACS.  Patient with continued tachycardia on reevaluation. Ordered additional IV fluids, ativan.  Takes fluoxetine but low suspicion at this time for serotonin syndrome. Denies possibility of withdrawal.  Will obtain CT PE study for further eval of tachycardia and CT A/P to evaluate for other signs of bleeding or infection.    Care signed out to Dr. Tyrone Nine with CTs, UA, reevaluation pending.    Final Clinical Impressions(s) / ED Diagnoses   Final diagnoses:  Fall, initial encounter  Acute midline back pain, unspecified back location  Neck pain  Contusion of left hip, initial encounter  Tachycardia    ED Discharge Orders         Ordered    cyclobenzaprine (FLEXERIL) 10 MG tablet  2 times daily PRN     09/26/18 1841          Gareth Morgan, MD 09/26/18 2020

## 2018-09-26 NOTE — Progress Notes (Signed)
Orthopedic Tech Progress Note Patient Details:  Annette Norman 01/24/1877 [id number] Level 2 trauma Patient ID: [name], female   DOB: 01/24/1877, 53 y.o.   MRN: [id number]   Janit Pagan 09/26/2018, 12:38 PM

## 2018-09-26 NOTE — ED Notes (Signed)
Pt ambulatory without difficulty. Gait steady and even.  

## 2018-09-26 NOTE — ED Notes (Signed)
Pt refused she wants lab pulled from IV

## 2018-09-27 LAB — SAMPLE TO BLOOD BANK

## 2018-10-05 ENCOUNTER — Telehealth: Payer: Self-pay

## 2018-10-05 NOTE — Telephone Encounter (Signed)
Refill request from [facility] sent for Mobic Patient requesting 15mg  with 90 day supply please advise. Thanks.

## 2018-10-08 MED ORDER — MELOXICAM 7.5 MG PO TABS: 15.0000 mg | ORAL_TABLET | Freq: Every day | ORAL | 0 refills | Status: DC

## 2018-10-08 NOTE — Telephone Encounter (Signed)
Ok for 15 mg qd only and no refill

## 2018-10-08 NOTE — Addendum Note (Signed)
Addended byLaurann Montana on: 10/08/2018 08:43 AM   Modules accepted: Orders

## 2018-10-08 NOTE — Telephone Encounter (Signed)
Called pharmacy and advised cannot fill 90 day supply

## 2018-10-22 ENCOUNTER — Telehealth: Payer: Self-pay | Admitting: Family Medicine

## 2018-10-22 ENCOUNTER — Ambulatory Visit: Payer: Medicare HMO | Admitting: Family Medicine

## 2018-10-22 MED ORDER — TOPIRAMATE 100 MG PO TABS: 100.0000 mg | ORAL_TABLET | Freq: Two times a day (BID) | ORAL | 0 refills | Status: DC

## 2018-10-22 NOTE — Telephone Encounter (Signed)
Yes please. For 1 month

## 2018-10-22 NOTE — Telephone Encounter (Signed)
Patient arrived 8 minutes late for her apt today and was rescheduled. She states she is out of her Topamax. Best call back is [phone]

## 2018-10-22 NOTE — Telephone Encounter (Signed)
Spoke with the patient and she needed a refill on her Topamax 100 mg. New script for 1 month has been sent to [facility] [place].
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
## 2018-10-23 ENCOUNTER — Other Ambulatory Visit: Payer: Self-pay | Admitting: Physician Assistant

## 2018-11-07 ENCOUNTER — Other Ambulatory Visit: Payer: Self-pay

## 2018-11-07 ENCOUNTER — Telehealth: Payer: Self-pay

## 2018-11-07 ENCOUNTER — Ambulatory Visit (INDEPENDENT_AMBULATORY_CARE_PROVIDER_SITE_OTHER): Payer: Medicare Other | Admitting: Family Medicine

## 2018-11-07 ENCOUNTER — Encounter: Payer: Self-pay | Admitting: Family Medicine

## 2018-11-07 VITALS — BP 112/81 | HR 104 | Temp 97.3°F | Ht 63.0 in | Wt 151.8 lb

## 2018-11-07 DIAGNOSIS — G894 Chronic pain syndrome: Secondary | ICD-10-CM | POA: Insufficient documentation

## 2018-11-07 DIAGNOSIS — G43009 Migraine without aura, not intractable, without status migrainosus: Secondary | ICD-10-CM

## 2018-11-07 DIAGNOSIS — G4733 Obstructive sleep apnea (adult) (pediatric): Secondary | ICD-10-CM | POA: Diagnosis not present

## 2018-11-07 MED ORDER — AIMOVIG 140 MG/ML ~~LOC~~ SOAJ: 140.0000 mg | SUBCUTANEOUS | 3 refills | Status: DC

## 2018-11-07 MED ORDER — TOPIRAMATE 100 MG PO TABS: 100.0000 mg | ORAL_TABLET | Freq: Two times a day (BID) | ORAL | 3 refills | Status: DC

## 2018-11-07 MED ORDER — DIVALPROEX SODIUM 250 MG PO DR TAB: 500.0000 mg | DELAYED_RELEASE_TABLET | Freq: Every day | ORAL | 2 refills | Status: DC

## 2018-11-07 MED ORDER — LEVETIRACETAM 500 MG PO TABS: 500.0000 mg | ORAL_TABLET | Freq: Two times a day (BID) | ORAL | 11 refills | Status: DC

## 2018-11-07 NOTE — Telephone Encounter (Signed)
Patients Pharmacy called about a possible medication interaction. Between Topiramate 100 mg and Divalproex.   Spoke with the provider NP Amy Lomax, she informed to cancel the Divalproex and start Keppra 500mg  2 times daily instead.
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
## 2018-11-07 NOTE — Patient Instructions (Addendum)
Continue topiramate, Amovig and Maxalt as prescribed  Add Keppra 500mg  twice daily  Return 3 months for evaluation  Migraine Headache A migraine headache is a very strong throbbing pain on one side or both sides of your head. This type of headache can also cause other symptoms. It can last from 4 hours to 3 days. Talk with your doctor about what things may bring on (trigger) this condition. What are the causes? The exact cause of this condition is not known. This condition may be triggered or caused by:  Drinking alcohol.  Smoking.  Taking medicines, such as: ? Medicine used to treat chest pain (nitroglycerin). ? Birth control pills. ? Estrogen. ? Some blood pressure medicines.  Eating or drinking certain products.  Doing physical activity. Other things that may trigger a migraine headache include:  Having a menstrual period.  Pregnancy.  Hunger.  Stress.  Not getting enough sleep or getting too much sleep.  Weather changes.  Tiredness (fatigue). What increases the risk?  Being 31-44 years old.  Being female.  Having a family history of migraine headaches.  Being Caucasian.  Having depression or anxiety.  Being very overweight. What are the signs or symptoms?  A throbbing pain. This pain may: ? Happen in any area of the head, such as on one side or both sides. ? Make it hard to do daily activities. ? Get worse with physical activity. ? Get worse around bright lights or loud noises.  Other symptoms may include: ? Feeling sick to your stomach (nauseous). ? Vomiting. ? Dizziness. ? Being sensitive to bright lights, loud noises, or smells.  Before you get a migraine headache, you may get warning signs (an aura). An aura may include: ? Seeing flashing lights or having blind spots. ? Seeing bright spots, halos, or zigzag lines. ? Having tunnel vision or blurred vision. ? Having numbness or a tingling feeling. ? Having trouble talking. ? Having weak  muscles.  Some people have symptoms after a migraine headache (postdromal phase), such as: ? Tiredness. ? Trouble thinking (concentrating). How is this treated?  Taking medicines that: ? Relieve pain. ? Relieve the feeling of being sick to your stomach. ? Prevent migraine headaches.  Treatment may also include: ? Having acupuncture. ? Avoiding foods that bring on migraine headaches. ? Learning ways to control your body functions (biofeedback). ? Therapy to help you know and deal with negative thoughts (cognitive behavioral therapy). Follow these instructions at home: Medicines  Take over-the-counter and prescription medicines only as told by your doctor.  Ask your doctor if the medicine prescribed to you: ? Requires you to avoid driving or using heavy machinery. ? Can cause trouble pooping (constipation). You may need to take these steps to prevent or treat trouble pooping:  Drink enough fluid to keep your pee (urine) pale yellow.  Take over-the-counter or prescription medicines.  Eat foods that are high in fiber. These include beans, whole grains, and fresh fruits and vegetables.  Limit foods that are high in fat and sugar. These include fried or sweet foods. Lifestyle  Do not drink alcohol.  Do not use any products that contain nicotine or tobacco, such as cigarettes, e-cigarettes, and chewing tobacco. If you need help quitting, ask your doctor.  Get at least 8 hours of sleep every night.  Limit and deal with stress. General instructions      Keep a journal to find out what may bring on your migraine headaches. For example, write down: ? What you  eat and drink. ? How much sleep you get. ? Any change in what you eat or drink. ? Any change in your medicines.  If you have a migraine headache: ? Avoid things that make your symptoms worse, such as bright lights. ? It may help to lie down in a dark, quiet room. ? Do not drive or use heavy machinery. ? Ask your  doctor what activities are safe for you.  Keep all follow-up visits as told by your doctor. This is important. Contact a doctor if:  You get a migraine headache that is different or worse than others you have had.  You have more than 15 headache days in one month. Get help right away if:  Your migraine headache gets very bad.  Your migraine headache lasts longer than 72 hours.  You have a fever.  You have a stiff neck.  You have trouble seeing.  Your muscles feel weak or like you cannot control them.  You start to lose your balance a lot.  You start to have trouble walking.  You pass out (faint).  You have a seizure. Summary  A migraine headache is a very strong throbbing pain on one side or both sides of your head. These headaches can also cause other symptoms.  This condition may be treated with medicines and changes to your lifestyle.  Keep a journal to find out what may bring on your migraine headaches.  Contact a doctor if you get a migraine headache that is different or worse than others you have had.  Contact your doctor if you have more than 15 headache days in a month. This information is not intended to replace advice given to you by your health care provider. Make sure you discuss any questions you have with your health care provider. Document Released: 10/20/2007 Document Revised: 05/04/2018 Document Reviewed: 02/22/2018 Elsevier Patient Education  2020 [employer].

## 2018-11-08 NOTE — Telephone Encounter (Signed)
Started a PA for... AIMOVIG 140 MG/ML  Key is: [id number]

## 2018-11-14 NOTE — Telephone Encounter (Signed)
Let her know it was denied. If she wants to stop it and see how things go over the next few weeks she can. If headaches worsen, we can either appeal or try another medication.

## 2018-11-14 NOTE — Telephone Encounter (Signed)
Plan denied the PA.  Did an Electronic appeal Via Covermymeds. [email or web address]  Key:  Jetta Lout

## 2018-11-14 NOTE — Telephone Encounter (Signed)
I called pt and she is ok to hold off for now and see how the keppra does for her.  She will call back and let us know.

## 2018-11-19 NOTE — Progress Notes (Signed)
I reviewed note and agree with plan.   Braelen Sproule R. Sadat Sliwa, MD 11/19/2018, 11:13 AM Certified in Neurology, Neurophysiology and Neuroimaging  [facility] [address], [address] [place], [place] [address] [phone]  

## 2018-11-29 NOTE — Telephone Encounter (Addendum)
Appeal done Case # [id number] TD.  Member ID [id number]  Aimovig approved valid 11-08-18 thru 01-24-20.  [employer].  Fax confirmation received [facility] [email or web address].

## 2019-02-07 ENCOUNTER — Encounter: Payer: Self-pay | Admitting: Family Medicine

## 2019-02-07 ENCOUNTER — Ambulatory Visit (INDEPENDENT_AMBULATORY_CARE_PROVIDER_SITE_OTHER): Payer: Medicare Other | Admitting: Family Medicine

## 2019-02-07 ENCOUNTER — Other Ambulatory Visit: Payer: Self-pay

## 2019-02-07 VITALS — BP 109/77 | HR 105 | Temp 96.9°F | Ht 63.0 in | Wt 157.0 lb

## 2019-02-07 DIAGNOSIS — G43009 Migraine without aura, not intractable, without status migrainosus: Secondary | ICD-10-CM

## 2019-02-07 MED ORDER — LEVETIRACETAM 500 MG PO TABS: 500.0000 mg | ORAL_TABLET | Freq: Two times a day (BID) | ORAL | 3 refills | Status: DC

## 2019-02-07 MED ORDER — TOPIRAMATE 100 MG PO TABS: 100.0000 mg | ORAL_TABLET | Freq: Two times a day (BID) | ORAL | 3 refills | Status: DC

## 2019-02-07 NOTE — Patient Instructions (Addendum)
Continue topiramate and levetiracetam.   Drink 50-60 ounces of water daily  Regular exercise and healthy diet   Follow up with me in 1 year   Migraine Headache A migraine headache is a very strong throbbing pain on one side or both sides of your head. This type of headache can also cause other symptoms. It can last from 4 hours to 3 days. Talk with your doctor about what things may bring on (trigger) this condition. What are the causes? The exact cause of this condition is not known. This condition may be triggered or caused by:  Drinking alcohol.  Smoking.  Taking medicines, such as: ? Medicine used to treat chest pain (nitroglycerin). ? Birth control pills. ? Estrogen. ? Some blood pressure medicines.  Eating or drinking certain products.  Doing physical activity. Other things that may trigger a migraine headache include:  Having a menstrual period.  Pregnancy.  Hunger.  Stress.  Not getting enough sleep or getting too much sleep.  Weather changes.  Tiredness (fatigue). What increases the risk?  Being 66-39 years old.  Being female.  Having a family history of migraine headaches.  Being Caucasian.  Having depression or anxiety.  Being very overweight. What are the signs or symptoms?  A throbbing pain. This pain may: ? Happen in any area of the head, such as on one side or both sides. ? Make it hard to do daily activities. ? Get worse with physical activity. ? Get worse around bright lights or loud noises.  Other symptoms may include: ? Feeling sick to your stomach (nauseous). ? Vomiting. ? Dizziness. ? Being sensitive to bright lights, loud noises, or smells.  Before you get a migraine headache, you may get warning signs (an aura). An aura may include: ? Seeing flashing lights or having blind spots. ? Seeing bright spots, halos, or zigzag lines. ? Having tunnel vision or blurred vision. ? Having numbness or a tingling feeling. ? Having  trouble talking. ? Having weak muscles.  Some people have symptoms after a migraine headache (postdromal phase), such as: ? Tiredness. ? Trouble thinking (concentrating). How is this treated?  Taking medicines that: ? Relieve pain. ? Relieve the feeling of being sick to your stomach. ? Prevent migraine headaches.  Treatment may also include: ? Having acupuncture. ? Avoiding foods that bring on migraine headaches. ? Learning ways to control your body functions (biofeedback). ? Therapy to help you know and deal with negative thoughts (cognitive behavioral therapy). Follow these instructions at home: Medicines  Take over-the-counter and prescription medicines only as told by your doctor.  Ask your doctor if the medicine prescribed to you: ? Requires you to avoid driving or using heavy machinery. ? Can cause trouble pooping (constipation). You may need to take these steps to prevent or treat trouble pooping:  Drink enough fluid to keep your pee (urine) pale yellow.  Take over-the-counter or prescription medicines.  Eat foods that are high in fiber. These include beans, whole grains, and fresh fruits and vegetables.  Limit foods that are high in fat and sugar. These include fried or sweet foods. Lifestyle  Do not drink alcohol.  Do not use any products that contain nicotine or tobacco, such as cigarettes, e-cigarettes, and chewing tobacco. If you need help quitting, ask your doctor.  Get at least 8 hours of sleep every night.  Limit and deal with stress. General instructions      Keep a journal to find out what may bring on your  migraine headaches. For example, write down: ? What you eat and drink. ? How much sleep you get. ? Any change in what you eat or drink. ? Any change in your medicines.  If you have a migraine headache: ? Avoid things that make your symptoms worse, such as bright lights. ? It may help to lie down in a dark, quiet room. ? Do not drive or use  heavy machinery. ? Ask your doctor what activities are safe for you.  Keep all follow-up visits as told by your doctor. This is important. Contact a doctor if:  You get a migraine headache that is different or worse than others you have had.  You have more than 15 headache days in one month. Get help right away if:  Your migraine headache gets very bad.  Your migraine headache lasts longer than 72 hours.  You have a fever.  You have a stiff neck.  You have trouble seeing.  Your muscles feel weak or like you cannot control them.  You start to lose your balance a lot.  You start to have trouble walking.  You pass out (faint).  You have a seizure. Summary  A migraine headache is a very strong throbbing pain on one side or both sides of your head. These headaches can also cause other symptoms.  This condition may be treated with medicines and changes to your lifestyle.  Keep a journal to find out what may bring on your migraine headaches.  Contact a doctor if you get a migraine headache that is different or worse than others you have had.  Contact your doctor if you have more than 15 headache days in a month. This information is not intended to replace advice given to you by your health care provider. Make sure you discuss any questions you have with your health care provider. Document Revised: 05/04/2018 Document Reviewed: 02/22/2018 Elsevier Patient Education  [place].
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
## 2019-09-18 ENCOUNTER — Emergency Department (HOSPITAL_COMMUNITY)
Admission: EM | Admit: 2019-09-18 | Discharge: 2019-09-18 | Disposition: A | Discharge status: Home / Self Care | Payer: Medicare Other | Attending: Emergency Medicine | Admitting: Emergency Medicine

## 2019-09-18 ENCOUNTER — Other Ambulatory Visit: Payer: Self-pay

## 2019-09-18 ENCOUNTER — Encounter (HOSPITAL_COMMUNITY): Payer: Self-pay

## 2019-09-18 ENCOUNTER — Ambulatory Visit (HOSPITAL_COMMUNITY)
Admission: EM | Admit: 2019-09-18 | Discharge: 2019-09-18 | Disposition: A | Discharge status: Home / Self Care | Payer: Medicare HMO | Attending: Urgent Care | Admitting: Urgent Care

## 2019-09-18 ENCOUNTER — Emergency Department (HOSPITAL_COMMUNITY): Payer: Medicare Other

## 2019-09-18 DIAGNOSIS — M545 Low back pain: Secondary | ICD-10-CM | POA: Diagnosis not present

## 2019-09-18 DIAGNOSIS — R079 Chest pain, unspecified: Secondary | ICD-10-CM | POA: Diagnosis present

## 2019-09-18 DIAGNOSIS — M79602 Pain in left arm: Secondary | ICD-10-CM | POA: Diagnosis not present

## 2019-09-18 DIAGNOSIS — Z5321 Procedure and treatment not carried out due to patient leaving prior to being seen by health care provider: Secondary | ICD-10-CM | POA: Diagnosis not present

## 2019-09-18 LAB — I-STAT BETA HCG BLOOD, ED (MC, WL, AP ONLY): I-stat hCG, quantitative: <5 m[IU]/mL (ref <5)

## 2019-09-18 LAB — CBC
HCT: 44.3 % (ref 36.0–46.0)
Hemoglobin: 13.7 g/dL (ref 12.0–15.0)
MCH: 26.1 pg (ref 26.0–34.0)
MCHC: 30.9 g/dL (ref 30.0–36.0)
MCV: 84.5 fL (ref 80.0–100.0)
Platelets: 233 10*3/uL (ref 150–400)
RBC: 5.24 MIL/uL — ABNORMAL HIGH (ref 3.87–5.11)
RDW: 13.2 % (ref 11.5–15.5)
WBC: 5.2 10*3/uL (ref 4.0–10.5)
nRBC: 0 % (ref 0.0–0.2)

## 2019-09-18 LAB — BASIC METABOLIC PANEL
Anion gap: 12 (ref 5–15)
BUN: 9 mg/dL (ref 6–20)
CO2: 19 mmol/L — ABNORMAL LOW (ref 22–32)
Calcium: 8.9 mg/dL (ref 8.9–10.3)
Chloride: 108 mmol/L (ref 98–111)
Creatinine, Ser: 0.84 mg/dL (ref 0.44–1.00)
GFR calc Af Amer: >60 mL/min (ref >60)
GFR calc non Af Amer: >60 mL/min (ref >60)
Glucose, Bld: 255 mg/dL — ABNORMAL HIGH (ref 70–99)
Potassium: 3.7 mmol/L (ref 3.5–5.1)
Sodium: 139 mmol/L (ref 135–145)

## 2019-09-18 LAB — TROPONIN I (HIGH SENSITIVITY): Troponin I (High Sensitivity): <2 ng/L (ref <18)

## 2019-09-18 NOTE — ED Triage Notes (Signed)
Pt c/o "feeling like an elephant sitting on my chest" since 11AM today

## 2019-09-18 NOTE — ED Notes (Signed)
Patient is being discharged from the Urgent Care and sent to the Emergency Department via POV . Per Ouida Sills, patient is in need of higher level of care due to chest pain. Patient is aware and verbalizes understanding of plan of care.   Offered patient EMS transport and wheelchair transport to ER, patient adamantly declined. Vitals:   09/18/19 1513  BP: 115/76  Pulse: (!) 102  Resp: 16  Temp: 98.2 F (36.8 C)  SpO2: 91%

## 2019-09-18 NOTE — ED Triage Notes (Signed)
Pt reports chest pain since 11am, "feels like an elephant sitting on my chest". Pt states the pain radiates to her left arm and back. Pt a.o, nad noted

## 2019-09-18 NOTE — ED Notes (Signed)
Pt not answering for recheck of vitals

## 2020-02-10 ENCOUNTER — Ambulatory Visit: Payer: Medicare Other | Admitting: Family Medicine

## 2020-03-03 NOTE — Patient Instructions (Incomplete)
Below is our plan:  We will ***  Please make sure you are staying well hydrated. I recommend 50-60 ounces daily. Well balanced diet and regular exercise encouraged. Consistent sleep schedule with 6-8 hours recommended.   Please continue follow up with care team as directed.   Follow up with *** in ***  You may receive a survey regarding today's visit. I encourage you to leave honest feed back as I do use this information to improve patient care. Thank you for seeing me today!      Migraine Headache A migraine headache is a very strong throbbing pain on one side or both sides of your head. This type of headache can also cause other symptoms. It can last from 4 hours to 3 days. Talk with your doctor about what things may bring on (trigger) this condition. What are the causes? The exact cause of this condition is not known. This condition may be triggered or caused by:  Drinking alcohol.  Smoking.  Taking medicines, such as: ? Medicine used to treat chest pain (nitroglycerin). ? Birth control pills. ? Estrogen. ? Some blood pressure medicines.  Eating or drinking certain products.  Doing physical activity. Other things that may trigger a migraine headache include:  Having a menstrual period.  Pregnancy.  Hunger.  Stress.  Not getting enough sleep or getting too much sleep.  Weather changes.  Tiredness (fatigue). What increases the risk?  Being 25-55 years old.  Being female.  Having a family history of migraine headaches.  Being Caucasian.  Having depression or anxiety.  Being very overweight. What are the signs or symptoms?  A throbbing pain. This pain may: ? Happen in any area of the head, such as on one side or both sides. ? Make it hard to do daily activities. ? Get worse with physical activity. ? Get worse around bright lights or loud noises.  Other symptoms may include: ? Feeling sick to your stomach  (nauseous). ? Vomiting. ? Dizziness. ? Being sensitive to bright lights, loud noises, or smells.  Before you get a migraine headache, you may get warning signs (an aura). An aura may include: ? Seeing flashing lights or having blind spots. ? Seeing bright spots, halos, or zigzag lines. ? Having tunnel vision or blurred vision. ? Having numbness or a tingling feeling. ? Having trouble talking. ? Having weak muscles.  Some people have symptoms after a migraine headache (postdromal phase), such as: ? Tiredness. ? Trouble thinking (concentrating). How is this treated?  Taking medicines that: ? Relieve pain. ? Relieve the feeling of being sick to your stomach. ? Prevent migraine headaches.  Treatment may also include: ? Having acupuncture. ? Avoiding foods that bring on migraine headaches. ? Learning ways to control your body functions (biofeedback). ? Therapy to help you know and deal with negative thoughts (cognitive behavioral therapy). Follow these instructions at home: Medicines  Take over-the-counter and prescription medicines only as told by your doctor.  Ask your doctor if the medicine prescribed to you: ? Requires you to avoid driving or using heavy machinery. ? Can cause trouble pooping (constipation). You may need to take these steps to prevent or treat trouble pooping:  Drink enough fluid to keep your pee (urine) pale yellow.  Take over-the-counter or prescription medicines.  Eat foods that are high in fiber. These include beans, whole grains, and fresh fruits and vegetables.  Limit foods that are high in fat and sugar. These include fried or sweet foods. Lifestyle    Do not drink alcohol.  Do not use any products that contain nicotine or tobacco, such as cigarettes, e-cigarettes, and chewing tobacco. If you need help quitting, ask your doctor.  Get at least 8 hours of sleep every night.  Limit and deal with stress. General instructions  Keep a journal to  find out what may bring on your migraine headaches. For example, write down: ? What you eat and drink. ? How much sleep you get. ? Any change in what you eat or drink. ? Any change in your medicines.  If you have a migraine headache: ? Avoid things that make your symptoms worse, such as bright lights. ? It may help to lie down in a dark, quiet room. ? Do not drive or use heavy machinery. ? Ask your doctor what activities are safe for you.  Keep all follow-up visits as told by your doctor. This is important.      Contact a doctor if:  You get a migraine headache that is different or worse than others you have had.  You have more than 15 headache days in one month. Get help right away if:  Your migraine headache gets very bad.  Your migraine headache lasts longer than 72 hours.  You have a fever.  You have a stiff neck.  You have trouble seeing.  Your muscles feel weak or like you cannot control them.  You start to lose your balance a lot.  You start to have trouble walking.  You pass out (faint).  You have a seizure. Summary  A migraine headache is a very strong throbbing pain on one side or both sides of your head. These headaches can also cause other symptoms.  This condition may be treated with medicines and changes to your lifestyle.  Keep a journal to find out what may bring on your migraine headaches.  Contact a doctor if you get a migraine headache that is different or worse than others you have had.  Contact your doctor if you have more than 15 headache days in a month. This information is not intended to replace advice given to you by your health care provider. Make sure you discuss any questions you have with your health care provider. Document Revised: 05/04/2018 Document Reviewed: 02/22/2018 Elsevier Patient Education  2021 Elsevier Inc.  
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
## 2020-03-04 ENCOUNTER — Ambulatory Visit: Payer: Medicare Other | Admitting: Family Medicine

## 2020-06-23 NOTE — Patient Instructions (Signed)
Below is our plan:  We will continue topiramate 100mg  and levetiracetam 500mg  BID for migraine prevention and rizatriptan for abortive therapy. Please talk with your PCP regarding your CPAP and ask for a follow up to make sure apnea is well managed. If headaches worsen, please let me know.   Please make sure you are staying well hydrated. I recommend 50-60 ounces daily. Well balanced diet and regular exercise encouraged. Consistent sleep schedule with 6-8 hours recommended.   Please continue follow up with care team as directed.   Follow up with me in 1 year   You may receive a survey regarding today's visit. I encourage you to leave honest feed back as I do use this information to improve patient care. Thank you for seeing me today!      Migraine Headache A migraine headache is a very strong throbbing pain on one side or both sides of your head. This type of headache can also cause other symptoms. It can last from 4 hours to 3 days. Talk with your doctor about what things may bring on (trigger) this condition. What are the causes? The exact cause of this condition is not known. This condition may be triggered or caused by:  Drinking alcohol.  Smoking.  Taking medicines, such as: ? Medicine used to treat chest pain (nitroglycerin). ? Birth control pills. ? Estrogen. ? Some blood pressure medicines.  Eating or drinking certain products.  Doing physical activity. Other things that may trigger a migraine headache include:  Having a menstrual period.  Pregnancy.  Hunger.  Stress.  Not getting enough sleep or getting too much sleep.  Weather changes.  Tiredness (fatigue). What increases the risk?  Being 72-50 years old.  Being female.  Having a family history of migraine headaches.  Being Caucasian.  Having depression or anxiety.  Being very overweight. What are the signs or symptoms?  A throbbing pain. This pain may: ? Happen in any area of the head, such  as on one side or both sides. ? Make it hard to do daily activities. ? Get worse with physical activity. ? Get worse around bright lights or loud noises.  Other symptoms may include: ? Feeling sick to your stomach (nauseous). ? Vomiting. ? Dizziness. ? Being sensitive to bright lights, loud noises, or smells.  Before you get a migraine headache, you may get warning signs (an aura). An aura may include: ? Seeing flashing lights or having blind spots. ? Seeing bright spots, halos, or zigzag lines. ? Having tunnel vision or blurred vision. ? Having numbness or a tingling feeling. ? Having trouble talking. ? Having weak muscles.  Some people have symptoms after a migraine headache (postdromal phase), such as: ? Tiredness. ? Trouble thinking (concentrating). How is this treated?  Taking medicines that: ? Relieve pain. ? Relieve the feeling of being sick to your stomach. ? Prevent migraine headaches.  Treatment may also include: ? Having acupuncture. ? Avoiding foods that bring on migraine headaches. ? Learning ways to control your body functions (biofeedback). ? Therapy to help you know and deal with negative thoughts (cognitive behavioral therapy). Follow these instructions at home: Medicines  Take over-the-counter and prescription medicines only as told by your doctor.  Ask your doctor if the medicine prescribed to you: ? Requires you to avoid driving or using heavy machinery. ? Can cause trouble pooping (constipation). You may need to take these steps to prevent or treat trouble pooping:  Drink enough fluid to keep your pee (  urine) pale yellow.  Take over-the-counter or prescription medicines.  Eat foods that are high in fiber. These include beans, whole grains, and fresh fruits and vegetables.  Limit foods that are high in fat and sugar. These include fried or sweet foods. Lifestyle  Do not drink alcohol.  Do not use any products that contain nicotine or tobacco,  such as cigarettes, e-cigarettes, and chewing tobacco. If you need help quitting, ask your doctor.  Get at least 8 hours of sleep every night.  Limit and deal with stress. General instructions  Keep a journal to find out what may bring on your migraine headaches. For example, write down: ? What you eat and drink. ? How much sleep you get. ? Any change in what you eat or drink. ? Any change in your medicines.  If you have a migraine headache: ? Avoid things that make your symptoms worse, such as bright lights. ? It may help to lie down in a dark, quiet room. ? Do not drive or use heavy machinery. ? Ask your doctor what activities are safe for you.  Keep all follow-up visits as told by your doctor. This is important.      Contact a doctor if:  You get a migraine headache that is different or worse than others you have had.  You have more than 15 headache days in one month. Get help right away if:  Your migraine headache gets very bad.  Your migraine headache lasts longer than 72 hours.  You have a fever.  You have a stiff neck.  You have trouble seeing.  Your muscles feel weak or like you cannot control them.  You start to lose your balance a lot.  You start to have trouble walking.  You pass out (faint).  You have a seizure. Summary  A migraine headache is a very strong throbbing pain on one side or both sides of your head. These headaches can also cause other symptoms.  This condition may be treated with medicines and changes to your lifestyle.  Keep a journal to find out what may bring on your migraine headaches.  Contact a doctor if you get a migraine headache that is different or worse than others you have had.  Contact your doctor if you have more than 15 headache days in a month. This information is not intended to replace advice given to you by your health care provider. Make sure you discuss any questions you have with your health care  provider. Document Revised: 05/04/2018 Document Reviewed: 02/22/2018 Elsevier Patient Education  2021 [employer].
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
## 2020-06-24 ENCOUNTER — Ambulatory Visit (INDEPENDENT_AMBULATORY_CARE_PROVIDER_SITE_OTHER): Payer: Medicare Other | Admitting: Family Medicine

## 2020-06-24 ENCOUNTER — Encounter: Payer: Self-pay | Admitting: Family Medicine

## 2020-06-24 ENCOUNTER — Other Ambulatory Visit: Payer: Self-pay

## 2020-06-24 VITALS — BP 111/73 | HR 94 | Ht 63.0 in | Wt 163.2 lb

## 2020-06-24 DIAGNOSIS — G43009 Migraine without aura, not intractable, without status migrainosus: Secondary | ICD-10-CM | POA: Diagnosis not present

## 2020-06-24 DIAGNOSIS — G4733 Obstructive sleep apnea (adult) (pediatric): Secondary | ICD-10-CM

## 2020-06-24 MED ORDER — RIZATRIPTAN BENZOATE 10 MG PO TABS: 10.0000 mg | ORAL_TABLET | ORAL | 11 refills | Status: DC | PRN

## 2020-06-24 MED ORDER — LEVETIRACETAM 500 MG PO TABS: 500.0000 mg | ORAL_TABLET | Freq: Two times a day (BID) | ORAL | 3 refills | Status: DC

## 2020-06-24 MED ORDER — TOPIRAMATE 100 MG PO TABS: 100.0000 mg | ORAL_TABLET | Freq: Two times a day (BID) | ORAL | 3 refills | Status: DC

## 2020-06-25 ENCOUNTER — Encounter: Payer: Self-pay | Admitting: Family Medicine

## 2020-07-28 NOTE — Progress Notes (Signed)
I reviewed note and agree with plan.   Suanne Marker, MD 07/28/2020, 5:24 PM Certified in Neurology, Neurophysiology and Neuroimaging  [facility] [address], [address] [place], [place] [address] [phone]

## 2021-01-28 ENCOUNTER — Emergency Department (HOSPITAL_COMMUNITY): Payer: Medicare Other

## 2021-01-28 ENCOUNTER — Encounter (HOSPITAL_COMMUNITY): Payer: Self-pay | Admitting: Emergency Medicine

## 2021-01-28 ENCOUNTER — Emergency Department (HOSPITAL_COMMUNITY)
Admission: EM | Admit: 2021-01-28 | Discharge: 2021-01-28 | Disposition: A | Discharge status: Home / Self Care | Payer: Medicare Other | Attending: Emergency Medicine | Admitting: Emergency Medicine

## 2021-01-28 DIAGNOSIS — E1165 Type 2 diabetes mellitus with hyperglycemia: Secondary | ICD-10-CM | POA: Diagnosis not present

## 2021-01-28 DIAGNOSIS — Z79899 Other long term (current) drug therapy: Secondary | ICD-10-CM | POA: Diagnosis not present

## 2021-01-28 DIAGNOSIS — I10 Essential (primary) hypertension: Secondary | ICD-10-CM | POA: Diagnosis not present

## 2021-01-28 DIAGNOSIS — R739 Hyperglycemia, unspecified: Secondary | ICD-10-CM

## 2021-01-28 DIAGNOSIS — U071 COVID-19: Secondary | ICD-10-CM | POA: Insufficient documentation

## 2021-01-28 DIAGNOSIS — Z794 Long term (current) use of insulin: Secondary | ICD-10-CM | POA: Insufficient documentation

## 2021-01-28 DIAGNOSIS — Z7982 Long term (current) use of aspirin: Secondary | ICD-10-CM | POA: Diagnosis not present

## 2021-01-28 DIAGNOSIS — R059 Cough, unspecified: Secondary | ICD-10-CM | POA: Diagnosis present

## 2021-01-28 DIAGNOSIS — J449 Chronic obstructive pulmonary disease, unspecified: Secondary | ICD-10-CM | POA: Diagnosis not present

## 2021-01-28 LAB — BASIC METABOLIC PANEL
Anion gap: 10 (ref 5–15)
BUN: 9 mg/dL (ref 6–20)
CO2: 27 mmol/L (ref 22–32)
Calcium: 9.3 mg/dL (ref 8.9–10.3)
Chloride: 96 mmol/L — ABNORMAL LOW (ref 98–111)
Creatinine, Ser: 0.85 mg/dL (ref 0.44–1.00)
GFR, Estimated: >60 mL/min (ref >60)
Glucose, Bld: 487 mg/dL — ABNORMAL HIGH (ref 70–99)
Potassium: 4.9 mmol/L (ref 3.5–5.1)
Sodium: 133 mmol/L — ABNORMAL LOW (ref 135–145)

## 2021-01-28 LAB — RESP PANEL BY RT-PCR (FLU A&B, COVID) ARPGX2
Influenza A by PCR: NEGATIVE
Influenza B by PCR: NEGATIVE
SARS Coronavirus 2 by RT PCR: POSITIVE — AB

## 2021-01-28 LAB — CBC WITH DIFFERENTIAL/PLATELET
Abs Immature Granulocytes: 0.02 10*3/uL (ref 0.00–0.07)
Basophils Absolute: 0.1 10*3/uL (ref 0.0–0.1)
Basophils Relative: 1 %
Eosinophils Absolute: 0.1 10*3/uL (ref 0.0–0.5)
Eosinophils Relative: 1 %
HCT: 46.7 % — ABNORMAL HIGH (ref 36.0–46.0)
Hemoglobin: 14.8 g/dL (ref 12.0–15.0)
Immature Granulocytes: 0 %
Lymphocytes Relative: 16 %
Lymphs Abs: 1 10*3/uL (ref 0.7–4.0)
MCH: 26.3 pg (ref 26.0–34.0)
MCHC: 31.7 g/dL (ref 30.0–36.0)
MCV: 82.9 fL (ref 80.0–100.0)
Monocytes Absolute: 0.7 10*3/uL (ref 0.1–1.0)
Monocytes Relative: 11 %
Neutro Abs: 4.5 10*3/uL (ref 1.7–7.7)
Neutrophils Relative %: 71 %
Platelets: 209 10*3/uL (ref 150–400)
RBC: 5.63 MIL/uL — ABNORMAL HIGH (ref 3.87–5.11)
RDW: 13.2 % (ref 11.5–15.5)
WBC: 6.4 10*3/uL (ref 4.0–10.5)
nRBC: 0 % (ref 0.0–0.2)

## 2021-01-28 LAB — TROPONIN I (HIGH SENSITIVITY): Troponin I (High Sensitivity): 10 ng/L (ref <18)

## 2021-01-28 LAB — GROUP A STREP BY PCR: Group A Strep by PCR: NOT DETECTED

## 2021-01-28 LAB — CBG MONITORING, ED
Glucose-Capillary: 437 mg/dL — ABNORMAL HIGH (ref 70–99)
Glucose-Capillary: 360 mg/dL — ABNORMAL HIGH (ref 70–99)

## 2021-01-28 MED ORDER — SODIUM CHLORIDE 0.9 % IV SOLN
1000.0000 mL | INTRAVENOUS | Status: DC
  Administered 2021-01-28: 1000 mL via INTRAVENOUS

## 2021-01-28 MED ORDER — SODIUM CHLORIDE 0.9 % IV BOLUS (SEPSIS)
1000.0000 mL | Freq: Once | INTRAVENOUS | Status: AC
Start: 2021-01-28 — End: 2021-01-28
  Administered 2021-01-28: 1000 mL via INTRAVENOUS

## 2021-01-28 MED ORDER — BENZONATATE 100 MG PO CAPS
100.0000 mg | ORAL_CAPSULE | Freq: Once | ORAL | Status: AC
  Administered 2021-01-28: 100 mg via ORAL
  Filled 2021-01-28: qty 1

## 2021-01-28 MED ORDER — KETOROLAC TROMETHAMINE 30 MG/ML IJ SOLN
30.0000 mg | Freq: Once | INTRAMUSCULAR | Status: AC
  Administered 2021-01-28: 30 mg via INTRAVENOUS
  Filled 2021-01-28: qty 1

## 2021-01-28 MED ORDER — ACETAMINOPHEN 325 MG PO TABS
650.0000 mg | ORAL_TABLET | Freq: Once | ORAL | Status: AC
  Administered 2021-01-28: 650 mg via ORAL
  Filled 2021-01-28: qty 2

## 2021-01-28 MED ORDER — ONDANSETRON 8 MG PO TBDP: 8.0000 mg | ORAL_TABLET | Freq: Three times a day (TID) | ORAL | 0 refills | Status: DC | PRN

## 2021-01-28 MED ORDER — INSULIN ASPART 100 UNIT/ML IJ SOLN
8.0000 [IU] | Freq: Once | INTRAMUSCULAR | Status: AC
  Administered 2021-01-28: 8 [IU] via SUBCUTANEOUS

## 2021-01-28 MED ORDER — BENZONATATE 100 MG PO CAPS: 100.0000 mg | ORAL_CAPSULE | Freq: Three times a day (TID) | ORAL | 0 refills | Status: DC

## 2021-01-28 NOTE — Discharge Instructions (Signed)
Take the medications to help with coughing and nausea.  Make sure to monitor your blood sugar.  Take Tylenol ibuprofen as needed for aches and pains associated with the COVID infection.

## 2021-01-28 NOTE — ED Provider Notes (Signed)
[facility] Provider Note   CSN: [id number] Arrival date & time: 01/28/21  1010     History  Chief Complaint  Patient presents with   Sore Throat    Annette Norman is a 56 y.o. female.   Sore Throat  Patient has history of diabetes, hypertension, COPD, hyperlipidemia.  Patient has been vaccinated for COVID-19. Patient states she started having symptoms about a week ago.  She has had issues with cough and congestion.  She has had a sore throat.  She is also had headaches as well as body aches and backaches.  Patient states she has been taking her medications but often her blood sugar goes up when she is ill.  Home Medications Prior to Admission medications   Medication Sig Start Date End Date Taking? Authorizing Provider  aspirin EC 81 MG tablet Take 81 mg by mouth at bedtime.   Yes [provider]  benzonatate (TESSALON) 100 MG capsule Take 1 capsule (100 mg total) by mouth every 8 (eight) hours. 01/28/21  Yes Linwood DibblesKnapp, Madiline Saffran, MD  brimonidine-timolol (COMBIGAN) 0.2-0.5 % ophthalmic solution Place 1 drop into the left eye 2 (two) times daily. 01/27/16  Yes [provider]  busPIRone (BUSPAR) 10 MG tablet Take 10 mg by mouth at bedtime.   Yes [provider]  CRESTOR 40 MG tablet Take 40 mg by mouth at bedtime.  06/02/14  Yes [provider]  DEXILANT 60 MG capsule Take 1 capsule by mouth at bedtime. 09/08/18  Yes [provider]  EPINEPHrine 0.3 mg/0.3 mL IJ SOAJ injection Inject 0.3 mg into the muscle as needed for anaphylaxis. 03/31/17  Yes [provider]  esomeprazole (NEXIUM) 40 MG capsule Take 40 mg by mouth 2 (two) times daily before a meal.   Yes [provider]  FLUoxetine (PROZAC) 40 MG capsule Take 40 mg by mouth at bedtime.  08/18/16  Yes [provider]  gabapentin (NEURONTIN) 300 MG capsule Take 300 mg by mouth 2 (two) times daily. 08/27/18  Yes [provider]   HYDROcodone-acetaminophen (NORCO) 10-325 MG tablet Take 1 tablet by mouth 4 (four) times daily.  03/16/15  Yes [provider]  insulin detemir (LEVEMIR) 100 UNIT/ML injection Inject 15 Units into the skin at bedtime.   Yes [provider]  JARDIANCE 10 MG TABS tablet Take 10 mg by mouth daily. 12/11/20  Yes [provider]  levETIRAcetam (KEPPRA) 500 MG tablet Take 1 tablet (500 mg total) by mouth 2 (two) times daily. 06/24/20  Yes Lomax, Amy, NP  Multiple Vitamins-Minerals (MULTIVITAMIN ADULTS 50+ PO) Take 1 tablet by mouth at bedtime.  12/19/16  Yes [provider]  ondansetron (ZOFRAN-ODT) 8 MG disintegrating tablet Take 1 tablet (8 mg total) by mouth every 8 (eight) hours as needed for nausea or vomiting. 01/28/21  Yes Linwood DibblesKnapp, Mckinley Olheiser, MD  SAVELLA 50 MG TABS tablet Take 50 mg by mouth 2 (two) times daily. 08/27/18  Yes [provider]  topiramate (TOPAMAX) 100 MG tablet Take 1 tablet (100 mg total) by mouth 2 (two) times daily. 06/24/20  Yes Lomax, Amy, NP  Travoprost, BAK Free, (TRAVATAN) 0.004 % SOLN ophthalmic solution Place 1 drop into the left eye at bedtime. 01/27/16  Yes [provider]  traZODone (DESYREL) 100 MG tablet Take 100 mg by mouth at bedtime as needed for sleep.  06/29/16  Yes [provider]  trimethoprim-polymyxin b (POLYTRIM) ophthalmic solution Place 1 drop into the left eye  2 (two) times daily.  03/30/15  Yes [provider]  TRULANCE 3 MG TABS Take 1 tablet by mouth daily. 08/06/18  Yes [provider]  VENTOLIN HFA 108 (90 Base) MCG/ACT inhaler Inhale 1-2 puffs into the lungs every 6 (six) hours as needed for wheezing or shortness of breath.  07/10/16  Yes [provider]  diclofenac sodium (VOLTAREN) 1 % GEL Apply 2 g topically 4 (four) times daily. Patient not taking: Reported on 01/28/2021 09/17/16   Calvert Cantor, MD  Insulin Pen Needle (CAREFINE PEN NEEDLES) 32G X 6 MM MISC Use daily to inject insulin  as instructed 02/06/15   Vassie Loll, MD  methocarbamol (ROBAXIN) 500 MG tablet Take 500 mg by mouth every 6 (six) hours as needed for muscle spasms. Patient not taking: Reported on 01/28/2021 07/03/17   [provider]  rizatriptan (MAXALT) 10 MG tablet Take 1 tablet (10 mg total) by mouth as needed for migraine. May repeat in 2 hours if needed Patient not taking: Reported on 01/28/2021 06/24/20   Shawnie Dapper, NP      Allergies    Bee venom, Bee venom, Amitriptyline, Nortriptyline, Amitriptyline, Norco [hydrocodone-acetaminophen], Nortriptyline, and Zithromax [azithromycin]    Review of Systems   Review of Systems  All other systems reviewed and are negative.  Physical Exam Updated Vital Signs BP 133/74    Pulse 99    Temp 100 F (37.8 C)    Resp 15    SpO2 97%  Physical Exam Vitals and nursing note reviewed.  Constitutional:      General: She is not in acute distress.    Appearance: She is well-developed.  HENT:     Head: Normocephalic and atraumatic.     Right Ear: External ear normal.     Left Ear: External ear normal.  Eyes:     General: No scleral icterus.       Right eye: No discharge.        Left eye: No discharge.     Conjunctiva/sclera: Conjunctivae normal.  Neck:     Trachea: No tracheal deviation.  Cardiovascular:     Rate and Rhythm: Normal rate and regular rhythm.  Pulmonary:     Effort: Pulmonary effort is normal. No respiratory distress.     Breath sounds: Normal breath sounds. No stridor. No wheezing or rales.  Abdominal:     General: Bowel sounds are normal. There is no distension.     Palpations: Abdomen is soft.     Tenderness: There is no abdominal tenderness. There is no guarding or rebound.  Musculoskeletal:        General: No tenderness or deformity.     Cervical back: Neck supple.  Skin:    General: Skin is warm and dry.     Findings: No rash.  Neurological:     General: No focal deficit present.     Mental Status: She is alert.      Cranial Nerves: No cranial nerve deficit (no facial droop, extraocular movements intact, no slurred speech).     Sensory: No sensory deficit.     Motor: No abnormal muscle tone or seizure activity.     Coordination: Coordination normal.  Psychiatric:        Mood and Affect: Mood normal.    ED Results / Procedures / Treatments   Labs (all labs ordered are listed, but only abnormal results are displayed) Labs Reviewed  RESP PANEL BY RT-PCR (FLU A&B, COVID) ARPGX2 - Abnormal; Notable  for the following components:      Result Value   SARS Coronavirus 2 by RT PCR POSITIVE (*)    All other components within normal limits  CBC WITH DIFFERENTIAL/PLATELET - Abnormal; Notable for the following components:   RBC 5.63 (*)    HCT 46.7 (*)    All other components within normal limits  BASIC METABOLIC PANEL - Abnormal; Notable for the following components:   Sodium 133 (*)    Chloride 96 (*)    Glucose, Bld 487 (*)    All other components within normal limits  CBG MONITORING, ED - Abnormal; Notable for the following components:   Glucose-Capillary 437 (*)    All other components within normal limits  CBG MONITORING, ED - Abnormal; Notable for the following components:   Glucose-Capillary 360 (*)    All other components within normal limits  GROUP A STREP BY PCR  TROPONIN I (HIGH SENSITIVITY)    EKG EKG Interpretation  Date/Time:  Thursday January 28 2021 10:59:43 EST Ventricular Rate:  102 PR Interval:  164 QRS Duration: 80 QT Interval:  324 QTC Calculation: 422 R Axis:   69 Text Interpretation: Sinus tachycardia Cannot rule out Anterior infarct , age undetermined Abnormal ECG When compared with ECG of 18-Sep-2019 18:16, No significant change since last tracing Confirmed by Linwood Dibbles [phone]) on 01/28/2021 5:45:34 PM  Radiology DG Chest 2 View  Result Date: 01/28/2021 CLINICAL DATA:  cp EXAM: CHEST - 2 VIEW COMPARISON:  Eight 06/13/2019 FINDINGS: The heart size and mediastinal  contours are within normal limits. Both lungs are clear. No visible pleural effusions or pneumothorax. No acute osseous abnormality. Right upper quadrant clips. IMPRESSION: No active cardiopulmonary disease. Electronically Signed   By: Feliberto Harts M.D.   On: 01/28/2021 11:39    Procedures Procedures    Medications Ordered in ED Medications  sodium chloride 0.9 % bolus 1,000 mL (0 mLs Intravenous Stopped 01/28/21 2027)    Followed by  0.9 %  sodium chloride infusion (1,000 mLs Intravenous New Bag/Given 01/28/21 2042)  benzonatate (TESSALON) capsule 100 mg (100 mg Oral Given 01/28/21 1817)  ketorolac (TORADOL) 30 MG/ML injection 30 mg (30 mg Intravenous Given 01/28/21 1817)  acetaminophen (TYLENOL) tablet 650 mg (650 mg Oral Given 01/28/21 1817)  insulin aspart (novoLOG) injection 8 Units (8 Units Subcutaneous Given 01/28/21 1922)    ED Course/ Medical Decision Making/ A&P Clinical Course as of 01/28/21 2054  Thu Jan 28, 2021  1848 Blood sugar still elevated at 437.  Will order fluids and insulin [JK]  1858 Cbc normal, metabolic panel initially showed hyperglycemia.  Covid test positive.  No pna on cxr, images and rad report reviewed. [JK]  2042 Blood sugar is improved down to 360 [JK]    Clinical Course User Index [JK] Linwood Dibbles, MD                           Medical Decision Making  Patient presents with URI type symptoms.  ED work-up does not show any evidence of pneumonia.  Strep test is negative.  Her EKG is reassuring without acute changes.  Patient's COVID test is positive.  This certainly could account for her symptoms.  Fortunately she does not have an oxygen requirement.  No signs of severe complications from her covid requiring admission to the hospital.  She is outside any acute covid treatment window.  Patient however is hyperglycemic.  We will recheck her blood sugar.  We will also give her medications for her cough and headache.  Blood sugar has improved.  No signs of  hyperosmolar syndrome or DKA.  Appears stable for discharge.        Final Clinical Impression(s) / ED Diagnoses Final diagnoses:  COVID-19 virus infection  Hyperglycemia    Rx / DC Orders ED Discharge Orders          Ordered    benzonatate (TESSALON) 100 MG capsule  Every 8 hours        01/28/21 2052    ondansetron (ZOFRAN-ODT) 8 MG disintegrating tablet  Every 8 hours PRN        01/28/21 2052              Linwood DibblesKnapp, Jacquan Savas, MD 01/28/21 2054

## 2021-01-28 NOTE — ED Notes (Signed)
Pt discharged and ambulated out of the ED without difficulty. 

## 2021-01-28 NOTE — ED Provider Triage Note (Signed)
Emergency Medicine Provider Triage Evaluation Note  Annette Norman , Norman 56 y.o. female  was evaluated in triage.  Pt complains of complaints.  Patient with chest pain, shortness of breath, cough, elevated blood sugars.  No recent sick contacts.  Review of Systems  Positive: Cp, sob, cough, sore throat, elevated blood sugars Negative: Fever, back pain, LE swelling Physical Exam  BP 139/75    Pulse 99    Temp 98.8 F (37.1 C) (Oral)    Resp 20    SpO2 99%  Gen:   Awake, no distress   Eye:  Left eye pupil pin point (chronic per patient) Throat:  PO with mild erythema, uvula midline Resp:  Normal effort  MSK:   Moves extremities without difficulty  Other:    Medical Decision Making  Medically screening exam initiated at 10:54 AM.  Appropriate orders placed.  Annette Norman was informed that the remainder of the evaluation will be completed by another provider, this initial triage assessment does not replace that evaluation, and the importance of remaining in the ED until their evaluation is complete.  UR complaints, CP   Annette Monaco A, PA-C 01/28/21 1056

## 2021-01-28 NOTE — ED Triage Notes (Signed)
Patient complains of sore throat and cough that started over the weekend. Patient alert, oriented, ambulatory, afebrile in triage and in no apparent distress at this time.

## 2021-05-01 ENCOUNTER — Other Ambulatory Visit: Payer: Self-pay

## 2021-05-01 ENCOUNTER — Emergency Department
Admission: EM | Admit: 2021-05-01 | Discharge: 2021-05-01 | Disposition: A | Discharge status: Home / Self Care | Payer: Medicare Other | Attending: Emergency Medicine | Admitting: Emergency Medicine

## 2021-05-01 ENCOUNTER — Emergency Department: Payer: Medicare Other

## 2021-05-01 DIAGNOSIS — J449 Chronic obstructive pulmonary disease, unspecified: Secondary | ICD-10-CM | POA: Diagnosis not present

## 2021-05-01 DIAGNOSIS — I10 Essential (primary) hypertension: Secondary | ICD-10-CM | POA: Diagnosis not present

## 2021-05-01 DIAGNOSIS — M546 Pain in thoracic spine: Secondary | ICD-10-CM | POA: Insufficient documentation

## 2021-05-01 DIAGNOSIS — R0602 Shortness of breath: Secondary | ICD-10-CM | POA: Insufficient documentation

## 2021-05-01 DIAGNOSIS — R079 Chest pain, unspecified: Secondary | ICD-10-CM

## 2021-05-01 DIAGNOSIS — R739 Hyperglycemia, unspecified: Secondary | ICD-10-CM | POA: Diagnosis not present

## 2021-05-01 DIAGNOSIS — M549 Dorsalgia, unspecified: Secondary | ICD-10-CM

## 2021-05-01 LAB — CBC
HCT: 45.1 % (ref 36.0–46.0)
Hemoglobin: 14.4 g/dL (ref 12.0–15.0)
MCH: 26.4 pg (ref 26.0–34.0)
MCHC: 31.9 g/dL (ref 30.0–36.0)
MCV: 82.8 fL (ref 80.0–100.0)
Platelets: 245 10*3/uL (ref 150–400)
RBC: 5.45 MIL/uL — ABNORMAL HIGH (ref 3.87–5.11)
RDW: 12.9 % (ref 11.5–15.5)
WBC: 6.1 10*3/uL (ref 4.0–10.5)
nRBC: 0 % (ref 0.0–0.2)

## 2021-05-01 LAB — BASIC METABOLIC PANEL
Anion gap: 7 (ref 5–15)
BUN: 17 mg/dL (ref 6–20)
CO2: 29 mmol/L (ref 22–32)
Calcium: 9.1 mg/dL (ref 8.9–10.3)
Chloride: 101 mmol/L (ref 98–111)
Creatinine, Ser: 1.19 mg/dL — ABNORMAL HIGH (ref 0.44–1.00)
GFR, Estimated: 53 mL/min — ABNORMAL LOW (ref >60)
Glucose, Bld: 344 mg/dL — ABNORMAL HIGH (ref 70–99)
Potassium: 3.9 mmol/L (ref 3.5–5.1)
Sodium: 137 mmol/L (ref 135–145)

## 2021-05-01 LAB — D-DIMER, QUANTITATIVE: D-Dimer, Quant: 0.45 ug/mL-FEU (ref 0.00–0.50)

## 2021-05-01 LAB — TROPONIN I (HIGH SENSITIVITY): Troponin I (High Sensitivity): 6 ng/L (ref <18)

## 2021-05-01 MED ORDER — CYCLOBENZAPRINE HCL 10 MG PO TABS
10.0000 mg | ORAL_TABLET | Freq: Once | ORAL | Status: AC
  Administered 2021-05-01: 10 mg via ORAL
  Filled 2021-05-01: qty 1

## 2021-05-01 MED ORDER — ACETAMINOPHEN 500 MG PO TABS
1000.0000 mg | ORAL_TABLET | Freq: Once | ORAL | Status: AC
  Administered 2021-05-01: 1000 mg via ORAL
  Filled 2021-05-01: qty 2

## 2021-05-01 MED ORDER — LIDOCAINE 5 % EX PTCH
1.0000 | MEDICATED_PATCH | CUTANEOUS | Status: DC
  Administered 2021-05-01: 1 via TRANSDERMAL
  Filled 2021-05-01: qty 1

## 2021-05-01 MED ORDER — METHOCARBAMOL 500 MG PO TABS
500.0000 mg | ORAL_TABLET | Freq: Four times a day (QID) | ORAL | 0 refills | Status: AC | PRN
Start: 2021-05-01 — End: 2021-05-04

## 2021-05-01 MED ORDER — KETOROLAC TROMETHAMINE 30 MG/ML IJ SOLN
15.0000 mg | Freq: Once | INTRAMUSCULAR | Status: AC
  Administered 2021-05-01: 15 mg via INTRAVENOUS
  Filled 2021-05-01: qty 1

## 2021-05-01 NOTE — ED Provider Notes (Signed)
? ?[facility] ?Provider Note ? ? ? Event Date/Time  ? First MD Initiated Contact with Patient 05/01/21 1649   ?  (approximate) ? ? ?History  ? ?Shoulder Pain ? ? ?HPI ? ?Annette Norman is a 56 y.o. female with a past medical history of HTN, HDL, and COPD who presents for evaluation of 2 to 3 days of nontraumatic left-sided upper back pain associated with some left-sided sharp chest pain and shortness of breath.  Patient states she think she may have slept on it funny but is not sure.  She does not hurt in the front of her shoulder does not have any pain in the left arm, right arm right chest.  No cough, fevers, nausea, vomiting, diarrhea, abdominal pain, urinary symptoms, rash or extremity pain.  No recent falls or injuries.  She denies tobacco abuse, EtOH use or illicit drug use.  No other acute concerns at this time.  Does not exertional or positional. ? ?  ? ? ?Physical Exam  ?Triage Vital Signs: ?ED Triage Vitals  ?Enc Vitals Group  ?   BP 05/01/21 1651 134/76  ?   Pulse Rate 05/01/21 1651 86  ?   Resp 05/01/21 1651 18  ?   Temp 05/01/21 1651 98.1 ?F (36.7 ?C)  ?   Temp Source 05/01/21 1651 Oral  ?   SpO2 05/01/21 1651 97 %  ?   Weight 05/01/21 1653 160 lb (72.6 kg)  ?   Height 05/01/21 1653 5\' 2"  (1.575 m)  ?   Head Circumference --   ?   Peak Flow --   ?   Pain Score 05/01/21 1651 10  ?   Pain Loc --   ?   Pain Edu? --   ?   Excl. in GC? --   ? ? ?Most recent vital signs: ?Vitals:  ? 05/01/21 1700 05/01/21 1800  ?BP: 125/78 117/65  ?Pulse: 90 90  ?Resp: (!) 23 18  ?Temp:    ?SpO2: 98% 98%  ? ? ?General: Awake, no distress.  ?CV:  Good peripheral perfusion.  2+ radial pulses.  No murmur. ?Resp:  Normal effort.  Clear bilaterally ?Abd:  No distention.  Soft throughout ?Other:  Some mild tenderness over the left scapula without any overlying skin changes.  No tenderness or effusion or deformity or overlying skin changes over the shoulder joint itself or along the left arm.  2+ radial  pulse.  No tenderness of the left chest. ? ? ?ED Results / Procedures / Treatments  ?Labs ?(all labs ordered are listed, but only abnormal results are displayed) ?Labs Reviewed  ?BASIC METABOLIC PANEL - Abnormal; Notable for the following components:  ?    Result Value  ? Glucose, Bld 344 (*)   ? Creatinine, Ser 1.19 (*)   ? GFR, Estimated 53 (*)   ? All other components within normal limits  ?CBC - Abnormal; Notable for the following components:  ? RBC 5.45 (*)   ? All other components within normal limits  ?D-DIMER, QUANTITATIVE  ?TROPONIN I (HIGH SENSITIVITY)  ?TROPONIN I (HIGH SENSITIVITY)  ? ? ? ?EKG ? ?ECG is remarkable for sinus rhythm with a ventricular rate of 84, normal axis, unremarkable intervals without evidence of acute ischemia or significant arrhythmia. ? ? ?RADIOLOGY ?Chest reviewed by myself shows no focal consoidation, effusion, edema, pneumothorax or other clear acute thoracic process. I also reviewed radiology interpretation and agree with findings described. ? ? ? ?PROCEDURES: ? ?Critical  Care performed: No ? ?.1-3 Lead EKG Interpretation ?Performed by: Gilles Chiquito, MD ?Authorized by: Gilles Chiquito, MD  ? ?  Interpretation: normal   ?  ECG rate assessment: normal   ?  Rhythm: sinus rhythm   ?  Ectopy: none   ?  Conduction: normal   ? ?The patient is on the cardiac monitor to evaluate for evidence of arrhythmia and/or significant heart rate changes. ? ? ?MEDICATIONS ORDERED IN ED: ?Medications  ?lidocaine (LIDODERM) 5 % 1 patch (1 patch Transdermal Patch Applied 05/01/21 1731)  ?cyclobenzaprine (FLEXERIL) tablet 10 mg (has no administration in time range)  ?acetaminophen (TYLENOL) tablet 1,000 mg (1,000 mg Oral Given 05/01/21 1731)  ?ketorolac (TORADOL) 30 MG/ML injection 15 mg (15 mg Intravenous Given 05/01/21 1729)  ? ? ? ?IMPRESSION / MDM / ASSESSMENT AND PLAN / ED COURSE  ?I reviewed the triage vital signs and the nursing notes. ?             ?               ? ?Differential diagnosis  includes, but is not limited to ACS, PE, pneumonia, pneumothorax, pleurisy and MSK.  No findings on exam to suggest zoster or septic joint or cellulitis.  Suspicion for dissection given very reproducible pain over the left scapula and on anterior flexion of the left arm. ? ?ECG is remarkable for sinus rhythm with a ventricular rate of 84, normal axis, unremarkable intervals without evidence of acute ischemia or significant arrhythmia.  Given nonelevated troponin obtained greater than 3 hours after symptom onset I have low suspicion for ACS or myocarditis. ? ?Chest reviewed by myself shows no focal consoidation, effusion, edema, pneumothorax or other clear acute thoracic process. I also reviewed radiology interpretation and agree with findings described. ? ?BMP shows a glucose of 344 and a slight decrease in kidney function with a creatinine of 1.19 without any other significant electrolyte or metabolic derangements.  CBC without leukocytosis or acute anemia.  D-dimer 0.45 and stable low suspicion for PE at this time ? ?Overall unclear precise etiology for patient's symptoms although her exam is concerning for muscle spasms in her back.  She is feeling little better after lidocaine patch and Tylenol and Toradol.  She already takes Norco for some chronic back pain and advised her that given slight decrease in kidney function today we will hold off on any additional NSAIDs so she can have kidney function rechecked by PCP on Monday or Tuesday.  Discussed that she may use lidocaine patch as needed we will refill her Robaxin which she states is helped in the past.  At this point I think she stable for discharge with outpatient follow-up I discussed returning for any new or worsening of her symptoms.  Discharged in stable condition.  Strict return precautions advised and discussed.  Discussed having her blood sugar rechecked and avoiding any sugary beverages take her insulin as directed. ? ?  ? ? ?FINAL CLINICAL  IMPRESSION(S) / ED DIAGNOSES  ? ?Final diagnoses:  ?Upper back pain on left side  ?Chest pain, unspecified type  ?Hyperglycemia  ? ? ? ?Rx / DC Orders  ? ?ED Discharge Orders   ? ?      Ordered  ?  methocarbamol (ROBAXIN) 500 MG tablet  Every 6 hours PRN       ? 05/01/21 1824  ? ?  ?  ? ?  ? ? ? ?Note:  This document was prepared using Dragon  voice recognition software and may include unintentional dictation errors. ?  ?Gilles Chiquito, MD ?05/01/21 1829 ? ?

## 2021-05-01 NOTE — ED Triage Notes (Signed)
Pt states she has been having L shoulder pain for the last couple days that has progressively gotten worse- pt states she thought she slept on it wrong but then the pain started to radiate into her chest ?

## 2021-05-26 ENCOUNTER — Emergency Department: Payer: Medicare Other

## 2021-05-26 ENCOUNTER — Emergency Department
Admission: EM | Admit: 2021-05-26 | Discharge: 2021-05-26 | Disposition: A | Discharge status: Home / Self Care | Payer: Medicare Other | Attending: Emergency Medicine | Admitting: Emergency Medicine

## 2021-05-26 ENCOUNTER — Other Ambulatory Visit: Payer: Self-pay

## 2021-05-26 DIAGNOSIS — R519 Headache, unspecified: Secondary | ICD-10-CM | POA: Insufficient documentation

## 2021-05-26 DIAGNOSIS — I10 Essential (primary) hypertension: Secondary | ICD-10-CM | POA: Insufficient documentation

## 2021-05-26 DIAGNOSIS — R739 Hyperglycemia, unspecified: Secondary | ICD-10-CM

## 2021-05-26 DIAGNOSIS — E1165 Type 2 diabetes mellitus with hyperglycemia: Secondary | ICD-10-CM | POA: Insufficient documentation

## 2021-05-26 LAB — BASIC METABOLIC PANEL
Anion gap: 10 (ref 5–15)
BUN: 15 mg/dL (ref 6–20)
CO2: 22 mmol/L (ref 22–32)
Calcium: 9 mg/dL (ref 8.9–10.3)
Chloride: 100 mmol/L (ref 98–111)
Creatinine, Ser: 0.99 mg/dL (ref 0.44–1.00)
GFR, Estimated: >60 mL/min (ref >60)
Glucose, Bld: 484 mg/dL — ABNORMAL HIGH (ref 70–99)
Potassium: 4.3 mmol/L (ref 3.5–5.1)
Sodium: 132 mmol/L — ABNORMAL LOW (ref 135–145)

## 2021-05-26 LAB — URINALYSIS, ROUTINE W REFLEX MICROSCOPIC
Bacteria, UA: NONE SEEN
Bilirubin Urine: NEGATIVE
Glucose, UA: >=500 mg/dL — AB
Hgb urine dipstick: NEGATIVE
Ketones, ur: NEGATIVE mg/dL
Leukocytes,Ua: NEGATIVE
Nitrite: NEGATIVE
Protein, ur: NEGATIVE mg/dL
Specific Gravity, Urine: 1.026 (ref 1.005–1.030)
pH: 8 (ref 5.0–8.0)

## 2021-05-26 LAB — CBC
HCT: 46.2 % — ABNORMAL HIGH (ref 36.0–46.0)
Hemoglobin: 14.6 g/dL (ref 12.0–15.0)
MCH: 26.3 pg (ref 26.0–34.0)
MCHC: 31.6 g/dL (ref 30.0–36.0)
MCV: 83.2 fL (ref 80.0–100.0)
Platelets: 229 10*3/uL (ref 150–400)
RBC: 5.55 MIL/uL — ABNORMAL HIGH (ref 3.87–5.11)
RDW: 12.8 % (ref 11.5–15.5)
WBC: 6.2 10*3/uL (ref 4.0–10.5)
nRBC: 0 % (ref 0.0–0.2)

## 2021-05-26 LAB — CBG MONITORING, ED
Glucose-Capillary: 432 mg/dL — ABNORMAL HIGH (ref 70–99)
Glucose-Capillary: 355 mg/dL — ABNORMAL HIGH (ref 70–99)
Glucose-Capillary: 220 mg/dL — ABNORMAL HIGH (ref 70–99)

## 2021-05-26 LAB — TROPONIN I (HIGH SENSITIVITY): Troponin I (High Sensitivity): 4 ng/L (ref <18)

## 2021-05-26 MED ORDER — PROCHLORPERAZINE EDISYLATE 10 MG/2ML IJ SOLN
10.0000 mg | Freq: Once | INTRAMUSCULAR | Status: AC
  Administered 2021-05-26: 10 mg via INTRAVENOUS
  Filled 2021-05-26: qty 2

## 2021-05-26 MED ORDER — LACTATED RINGERS IV BOLUS
1000.0000 mL | Freq: Once | INTRAVENOUS | Status: AC
Start: 2021-05-26 — End: 2021-05-26
  Administered 2021-05-26: 1000 mL via INTRAVENOUS

## 2021-05-26 MED ORDER — SODIUM CHLORIDE 0.9 % IV BOLUS
1000.0000 mL | Freq: Once | INTRAVENOUS | Status: AC
  Administered 2021-05-26: 1000 mL via INTRAVENOUS

## 2021-05-26 MED ORDER — DIPHENHYDRAMINE HCL 50 MG/ML IJ SOLN
25.0000 mg | Freq: Once | INTRAMUSCULAR | Status: AC
  Administered 2021-05-26: 25 mg via INTRAVENOUS
  Filled 2021-05-26: qty 1

## 2021-05-26 MED ORDER — INSULIN ASPART 100 UNIT/ML IJ SOLN
7.0000 [IU] | Freq: Once | INTRAMUSCULAR | Status: AC
  Administered 2021-05-26: 7 [IU] via SUBCUTANEOUS
  Filled 2021-05-26: qty 1

## 2021-05-26 NOTE — ED Notes (Signed)
Pt up to toilet to obtain urine sample. Gait steady6, NAD. ?

## 2021-05-26 NOTE — ED Triage Notes (Signed)
Pt via POV from home. Pt c/o issues with her blood sugar today states her sugar was 500, pt took insulin around 1:00 but states she cant get it below 400. Pt c/o dizziness. Pt is A&OX4 and NAD. Pt has type 2 DM. ?

## 2021-05-26 NOTE — ED Provider Notes (Signed)
? ?[facility] ?Provider Note ? ? ? Event Date/Time  ? First MD Initiated Contact with Patient 05/26/21 1704   ?  (approximate) ? ? ?History  ? ?Hyperglycemia ? ? ?HPI ? ?Annette Norman is a 56 y.o. female with past medical history of hypertension, diabetes, hyperlipidemia, here with hyperglycemia.  The patient states that throughout the day today, she has felt very weak, fatigued, and slightly tremulous.  She states that she has had intermittent blurred vision.  She states that she checked her blood sugar due to this, and noted that it was read as high on her monitor.  She has had some associated aching, throbbing, generalized headache.  She then began to feel very fatigued.  She subsequent presents for evaluation.  She has a history of diabetes but states her blood sugars are normally much better controlled than this.  Denies any recent missed or changes in her medications.  No recent illnesses.  No significant recent stressors. ?  ? ? ?Physical Exam  ? ?Triage Vital Signs: ?ED Triage Vitals  ?Enc Vitals Group  ?   BP 05/26/21 1501 140/88  ?   Pulse Rate 05/26/21 1501 97  ?   Resp 05/26/21 1501 18  ?   Temp 05/26/21 1501 97.8 ?F (36.6 ?C)  ?   Temp Source 05/26/21 1501 Oral  ?   SpO2 05/26/21 1501 98 %  ?   Weight 05/26/21 1501 160 lb (72.6 kg)  ?   Height 05/26/21 1501 5\' 3"  (1.6 m)  ?   Head Circumference --   ?   Peak Flow --   ?   Pain Score 05/26/21 1500 10  ?   Pain Loc --   ?   Pain Edu? --   ?   Excl. in GC? --   ? ? ?Most recent vital signs: ?Vitals:  ? 05/26/21 1501  ?BP: 140/88  ?Pulse: 97  ?Resp: 18  ?Temp: 97.8 ?F (36.6 ?C)  ?SpO2: 98%  ? ? ? ?General: Awake, no distress.  ?CV:  Good peripheral perfusion.  Regular rate and rhythm.  No murmurs. ?Resp:  Normal effort.  Lungs clear to auscultation bilaterally. ?Abd:  No distention.  No tenderness.  No rebound or guarding. ?Other:  Cranial nerves II through XII intact.  Left pupil fixed at baseline, extraocular movements are intact.   No other cranial nerve deficits.  Strength out of 5 bilateral upper and lower extremities.  Normal sensation light touch.  No dysmetria. ? ? ?ED Results / Procedures / Treatments  ? ?Labs ?(all labs ordered are listed, but only abnormal results are displayed) ?Labs Reviewed  ?BASIC METABOLIC PANEL - Abnormal; Notable for the following components:  ?    Result Value  ? Sodium 132 (*)   ? Glucose, Bld 484 (*)   ? All other components within normal limits  ?CBC - Abnormal; Notable for the following components:  ? RBC 5.55 (*)   ? HCT 46.2 (*)   ? All other components within normal limits  ?URINALYSIS, ROUTINE W REFLEX MICROSCOPIC - Abnormal; Notable for the following components:  ? Color, Urine STRAW (*)   ? APPearance CLEAR (*)   ? Glucose, UA >=500 (*)   ? All other components within normal limits  ?CBG MONITORING, ED - Abnormal; Notable for the following components:  ? Glucose-Capillary 432 (*)   ? All other components within normal limits  ?CBG MONITORING, ED - Abnormal; Notable for the following components:  ?  Glucose-Capillary 355 (*)   ? All other components within normal limits  ?CBG MONITORING, ED - Abnormal; Notable for the following components:  ? Glucose-Capillary 220 (*)   ? All other components within normal limits  ?CBG MONITORING, ED  ?TROPONIN I (HIGH SENSITIVITY)  ? ? ? ?EKG ?Normal sinus rhythm, ventricular rate 90.  PR 166, QRS 84, QTc 452.  No acute ST elevations or depressions.  No EKG evidence of acute ischemia or infarct. ? ? ?RADIOLOGY ?CT head: No acute abnormality ?Chest x-ray: Clear ? ? ?I also independently reviewed and agree with radiologist interpretations. ? ? ?PROCEDURES: ? ?Critical Care performed: No ? ? ?MEDICATIONS ORDERED IN ED: ?Medications  ?sodium chloride 0.9 % bolus 1,000 mL (0 mLs Intravenous Stopped 05/26/21 1750)  ?lactated ringers bolus 1,000 mL (1,000 mLs Intravenous New Bag/Given 05/26/21 1749)  ?prochlorperazine (COMPAZINE) injection 10 mg (10 mg Intravenous Given 05/26/21  1744)  ?diphenhydrAMINE (BENADRYL) injection 25 mg (25 mg Intravenous Given 05/26/21 1743)  ?insulin aspart (novoLOG) injection 7 Units (7 Units Subcutaneous Given 05/26/21 1821)  ? ? ? ?IMPRESSION / MDM / ASSESSMENT AND PLAN / ED COURSE  ?I reviewed the triage vital signs and the nursing notes. ?             ?               ? ? ?The patient is on the cardiac monitor to evaluate for evidence of arrhythmia and/or significant heart rate changes. ? ? ? ?MDM:  ?56 year old female with past medical history of diabetes, hypertension, obesity, here with recurrent hypoglycemia.  Patient blood sugar reading is high at home.  Here, initial glucose 432.  I suspect etiology of her persistent hyperglycemia is dietary indiscretion and nonadherence to her insulin regimen.  She initially reported that she had been taking her mealtime insulin but significant other states he has never seen her use this.  He also states she eats significant amounts of carbs at home.  Broad work-up obtained shows no evidence of ischemia, infection, or alternative triggers.  CBC with no leukocytosis or anemia.  BMP shows sugar of 44 but no anion gap, no AKI, no evidence of DKA.  Urinalysis with no ketones.  Repeat sugar 220 after IV fluids and insulin.  She feels much markedly improved.  She complained of headache, which I suspect is related to dehydration and her hyperglycemia, but CT scan obtained and shows no evidence of focal abnormality.  Chest x-ray clear.  I had a long discussion with the patient as well as her significant other regarding dietary changes, will provide with information and discharge with close outpatient follow-up.  Return precautions given. ? ? ?MEDICATIONS GIVEN IN ED: ?Medications  ?sodium chloride 0.9 % bolus 1,000 mL (0 mLs Intravenous Stopped 05/26/21 1750)  ?lactated ringers bolus 1,000 mL (1,000 mLs Intravenous New Bag/Given 05/26/21 1749)  ?prochlorperazine (COMPAZINE) injection 10 mg (10 mg Intravenous Given 05/26/21 1744)   ?diphenhydrAMINE (BENADRYL) injection 25 mg (25 mg Intravenous Given 05/26/21 1743)  ?insulin aspart (novoLOG) injection 7 Units (7 Units Subcutaneous Given 05/26/21 1821)  ? ? ? ?Consults:  ? ? ? ?EMR reviewed  ?Previous ED visits ? ? ? ? ?FINAL CLINICAL IMPRESSION(S) / ED DIAGNOSES  ? ?Final diagnoses:  ?Hyperglycemia  ? ? ? ?Rx / DC Orders  ? ?ED Discharge Orders   ? ? None  ? ?  ? ? ? ?Note:  This document was prepared using Conservation officer, historic buildings and may include  unintentional dictation errors. ?  ?Shaune Pollack, MD ?05/26/21 1957 ? ?

## 2021-06-01 ENCOUNTER — Inpatient Hospital Stay
Admit: 2021-06-01 | Discharge: 2021-06-01 | Disposition: A | Discharge status: Home / Self Care | Payer: Medicare Other | Attending: Critical Care Medicine | Admitting: Critical Care Medicine

## 2021-06-01 ENCOUNTER — Other Ambulatory Visit: Payer: Self-pay

## 2021-06-01 ENCOUNTER — Inpatient Hospital Stay
Admission: EM | Admit: 2021-06-01 | Discharge: 2021-06-03 | DRG: 103 | Disposition: A | Discharge status: Home / Self Care | Payer: Medicare Other | Attending: Hospitalist | Admitting: Hospitalist

## 2021-06-01 ENCOUNTER — Emergency Department: Payer: Medicare Other

## 2021-06-01 ENCOUNTER — Encounter: Payer: Self-pay | Admitting: Pulmonary Disease

## 2021-06-01 DIAGNOSIS — Z888 Allergy status to other drugs, medicaments and biological substances status: Secondary | ICD-10-CM

## 2021-06-01 DIAGNOSIS — G4733 Obstructive sleep apnea (adult) (pediatric): Secondary | ICD-10-CM | POA: Diagnosis present

## 2021-06-01 DIAGNOSIS — R2990 Unspecified symptoms and signs involving the nervous system: Secondary | ICD-10-CM | POA: Diagnosis not present

## 2021-06-01 DIAGNOSIS — N183 Chronic kidney disease, stage 3 unspecified: Secondary | ICD-10-CM | POA: Diagnosis present

## 2021-06-01 DIAGNOSIS — Z9049 Acquired absence of other specified parts of digestive tract: Secondary | ICD-10-CM | POA: Diagnosis not present

## 2021-06-01 DIAGNOSIS — F419 Anxiety disorder, unspecified: Secondary | ICD-10-CM | POA: Diagnosis present

## 2021-06-01 DIAGNOSIS — Z90710 Acquired absence of both cervix and uterus: Secondary | ICD-10-CM

## 2021-06-01 DIAGNOSIS — Z823 Family history of stroke: Secondary | ICD-10-CM | POA: Diagnosis not present

## 2021-06-01 DIAGNOSIS — Z87891 Personal history of nicotine dependence: Secondary | ICD-10-CM

## 2021-06-01 DIAGNOSIS — E871 Hypo-osmolality and hyponatremia: Secondary | ICD-10-CM | POA: Diagnosis present

## 2021-06-01 DIAGNOSIS — F32A Depression, unspecified: Secondary | ICD-10-CM | POA: Diagnosis present

## 2021-06-01 DIAGNOSIS — Z79899 Other long term (current) drug therapy: Secondary | ICD-10-CM

## 2021-06-01 DIAGNOSIS — M7989 Other specified soft tissue disorders: Secondary | ICD-10-CM | POA: Diagnosis present

## 2021-06-01 DIAGNOSIS — H5462 Unqualified visual loss, left eye, normal vision right eye: Secondary | ICD-10-CM | POA: Diagnosis present

## 2021-06-01 DIAGNOSIS — G8194 Hemiplegia, unspecified affecting left nondominant side: Secondary | ICD-10-CM | POA: Diagnosis present

## 2021-06-01 DIAGNOSIS — G43809 Other migraine, not intractable, without status migrainosus: Secondary | ICD-10-CM | POA: Diagnosis not present

## 2021-06-01 DIAGNOSIS — G43109 Migraine with aura, not intractable, without status migrainosus: Principal | ICD-10-CM | POA: Diagnosis present

## 2021-06-01 DIAGNOSIS — E669 Obesity, unspecified: Secondary | ICD-10-CM | POA: Diagnosis present

## 2021-06-01 DIAGNOSIS — R0989 Other specified symptoms and signs involving the circulatory and respiratory systems: Secondary | ICD-10-CM

## 2021-06-01 DIAGNOSIS — E1122 Type 2 diabetes mellitus with diabetic chronic kidney disease: Secondary | ICD-10-CM | POA: Diagnosis present

## 2021-06-01 DIAGNOSIS — E1165 Type 2 diabetes mellitus with hyperglycemia: Secondary | ICD-10-CM | POA: Diagnosis present

## 2021-06-01 DIAGNOSIS — Z91030 Bee allergy status: Secondary | ICD-10-CM

## 2021-06-01 DIAGNOSIS — Z6829 Body mass index (BMI) 29.0-29.9, adult: Secondary | ICD-10-CM

## 2021-06-01 DIAGNOSIS — M797 Fibromyalgia: Secondary | ICD-10-CM | POA: Diagnosis present

## 2021-06-01 DIAGNOSIS — R519 Headache, unspecified: Secondary | ICD-10-CM | POA: Diagnosis not present

## 2021-06-01 DIAGNOSIS — Z791 Long term (current) use of non-steroidal anti-inflammatories (NSAID): Secondary | ICD-10-CM

## 2021-06-01 DIAGNOSIS — R200 Anesthesia of skin: Secondary | ICD-10-CM | POA: Diagnosis present

## 2021-06-01 DIAGNOSIS — E7800 Pure hypercholesterolemia, unspecified: Secondary | ICD-10-CM | POA: Diagnosis present

## 2021-06-01 DIAGNOSIS — R262 Difficulty in walking, not elsewhere classified: Secondary | ICD-10-CM | POA: Diagnosis present

## 2021-06-01 DIAGNOSIS — Z794 Long term (current) use of insulin: Secondary | ICD-10-CM

## 2021-06-01 DIAGNOSIS — Z885 Allergy status to narcotic agent status: Secondary | ICD-10-CM | POA: Diagnosis not present

## 2021-06-01 DIAGNOSIS — J449 Chronic obstructive pulmonary disease, unspecified: Secondary | ICD-10-CM | POA: Diagnosis present

## 2021-06-01 DIAGNOSIS — Z7982 Long term (current) use of aspirin: Secondary | ICD-10-CM | POA: Diagnosis not present

## 2021-06-01 DIAGNOSIS — Z8673 Personal history of transient ischemic attack (TIA), and cerebral infarction without residual deficits: Secondary | ICD-10-CM

## 2021-06-01 DIAGNOSIS — I639 Cerebral infarction, unspecified: Secondary | ICD-10-CM | POA: Diagnosis present

## 2021-06-01 DIAGNOSIS — Z7984 Long term (current) use of oral hypoglycemic drugs: Secondary | ICD-10-CM

## 2021-06-01 DIAGNOSIS — G4489 Other headache syndrome: Secondary | ICD-10-CM | POA: Diagnosis not present

## 2021-06-01 LAB — COMPREHENSIVE METABOLIC PANEL
ALT: 37 U/L (ref 0–44)
AST: 31 U/L (ref 15–41)
Albumin: 3.9 g/dL (ref 3.5–5.0)
Alkaline Phosphatase: 118 U/L (ref 38–126)
Anion gap: 8 (ref 5–15)
BUN: 12 mg/dL (ref 6–20)
CO2: 22 mmol/L (ref 22–32)
Calcium: 8.9 mg/dL (ref 8.9–10.3)
Chloride: 103 mmol/L (ref 98–111)
Creatinine, Ser: 0.74 mg/dL (ref 0.44–1.00)
GFR, Estimated: >60 mL/min (ref >60)
Glucose, Bld: 349 mg/dL — ABNORMAL HIGH (ref 70–99)
Potassium: 3.6 mmol/L (ref 3.5–5.1)
Sodium: 133 mmol/L — ABNORMAL LOW (ref 135–145)
Total Bilirubin: 0.6 mg/dL (ref 0.3–1.2)
Total Protein: 7.3 g/dL (ref 6.5–8.1)

## 2021-06-01 LAB — DIFFERENTIAL
Abs Immature Granulocytes: 0.04 10*3/uL (ref 0.00–0.07)
Basophils Absolute: 0.1 10*3/uL (ref 0.0–0.1)
Basophils Relative: 1 %
Eosinophils Absolute: 0.1 10*3/uL (ref 0.0–0.5)
Eosinophils Relative: 1 %
Immature Granulocytes: 1 %
Lymphocytes Relative: 30 %
Lymphs Abs: 1.7 10*3/uL (ref 0.7–4.0)
Monocytes Absolute: 0.5 10*3/uL (ref 0.1–1.0)
Monocytes Relative: 9 %
Neutro Abs: 3.2 10*3/uL (ref 1.7–7.7)
Neutrophils Relative %: 58 %

## 2021-06-01 LAB — LIPID PANEL
Cholesterol: 201 mg/dL — ABNORMAL HIGH (ref 0–200)
HDL: 32 mg/dL — ABNORMAL LOW (ref >40)
LDL Cholesterol: 112 mg/dL — ABNORMAL HIGH (ref 0–99)
Total CHOL/HDL Ratio: 6.3 RATIO
Triglycerides: 286 mg/dL — ABNORMAL HIGH (ref <150)
VLDL: 57 mg/dL — ABNORMAL HIGH (ref 0–40)

## 2021-06-01 LAB — CBC
HCT: 44.6 % (ref 36.0–46.0)
Hemoglobin: 14.2 g/dL (ref 12.0–15.0)
MCH: 25.8 pg — ABNORMAL LOW (ref 26.0–34.0)
MCHC: 31.8 g/dL (ref 30.0–36.0)
MCV: 81.1 fL (ref 80.0–100.0)
Platelets: 227 10*3/uL (ref 150–400)
RBC: 5.5 MIL/uL — ABNORMAL HIGH (ref 3.87–5.11)
RDW: 13 % (ref 11.5–15.5)
WBC: 5.5 10*3/uL (ref 4.0–10.5)
nRBC: 0 % (ref 0.0–0.2)

## 2021-06-01 LAB — CBG MONITORING, ED
Glucose-Capillary: 412 mg/dL — ABNORMAL HIGH (ref 70–99)
Glucose-Capillary: 271 mg/dL — ABNORMAL HIGH (ref 70–99)

## 2021-06-01 LAB — PROTIME-INR
INR: 1 (ref 0.8–1.2)
Prothrombin Time: 13.2 seconds (ref 11.4–15.2)

## 2021-06-01 LAB — HEMOGLOBIN A1C
Hgb A1c MFr Bld: 12.8 % — ABNORMAL HIGH (ref 4.8–5.6)
Mean Plasma Glucose: 320.66 mg/dL

## 2021-06-01 LAB — GLUCOSE, CAPILLARY
Glucose-Capillary: 130 mg/dL — ABNORMAL HIGH (ref 70–99)
Glucose-Capillary: 120 mg/dL — ABNORMAL HIGH (ref 70–99)

## 2021-06-01 LAB — TROPONIN I (HIGH SENSITIVITY)
Troponin I (High Sensitivity): 4 ng/L (ref <18)
Troponin I (High Sensitivity): 4 ng/L (ref <18)

## 2021-06-01 LAB — APTT: aPTT: 27 seconds (ref 24–36)

## 2021-06-01 MED ORDER — MONTELUKAST SODIUM 10 MG PO TABS
10.0000 mg | ORAL_TABLET | Freq: Every day | ORAL | Status: DC
Start: 2021-06-01 — End: 2021-06-03
  Administered 2021-06-01 – 2021-06-03 (×3): 10 mg via ORAL
  Filled 2021-06-01 (×3): qty 1

## 2021-06-01 MED ORDER — SODIUM CHLORIDE 0.9% FLUSH
3.0000 mL | Freq: Once | INTRAVENOUS | Status: AC
  Administered 2021-06-01: 3 mL via INTRAVENOUS

## 2021-06-01 MED ORDER — MORPHINE SULFATE (PF) 4 MG/ML IV SOLN
4.0000 mg | INTRAVENOUS | Status: DC | PRN
  Administered 2021-06-01 (×2): 4 mg via INTRAVENOUS
  Filled 2021-06-01 (×2): qty 1

## 2021-06-01 MED ORDER — SODIUM CHLORIDE 0.9 % IV BOLUS
1000.0000 mL | Freq: Once | INTRAVENOUS | Status: AC
  Administered 2021-06-01: 1000 mL via INTRAVENOUS

## 2021-06-01 MED ORDER — DOCUSATE SODIUM 100 MG PO CAPS
100.0000 mg | ORAL_CAPSULE | Freq: Two times a day (BID) | ORAL | Status: DC | PRN
Start: 2021-06-01 — End: 2021-06-03

## 2021-06-01 MED ORDER — TENECTEPLASE FOR STROKE
PACK | INTRAVENOUS | Status: AC
  Filled 2021-06-01: qty 10

## 2021-06-01 MED ORDER — LEVETIRACETAM 500 MG PO TABS
500.0000 mg | ORAL_TABLET | Freq: Two times a day (BID) | ORAL | Status: DC
Start: 2021-06-01 — End: 2021-06-03
  Administered 2021-06-01 – 2021-06-03 (×4): 500 mg via ORAL
  Filled 2021-06-01 (×4): qty 1

## 2021-06-01 MED ORDER — TENECTEPLASE FOR STROKE
0.2500 mg/kg | PACK | Freq: Once | INTRAVENOUS | Status: AC
  Administered 2021-06-01: 19 mg via INTRAVENOUS

## 2021-06-01 MED ORDER — ALBUTEROL SULFATE (2.5 MG/3ML) 0.083% IN NEBU: 3.0000 mL | INHALATION_SOLUTION | Freq: Four times a day (QID) | RESPIRATORY_TRACT | Status: DC | PRN

## 2021-06-01 MED ORDER — POTASSIUM CHLORIDE 10 MEQ/100ML IV SOLN
10.0000 meq | INTRAVENOUS | Status: AC
  Administered 2021-06-01 (×2): 10 meq via INTRAVENOUS
  Filled 2021-06-01 (×2): qty 100

## 2021-06-01 MED ORDER — DIPHENHYDRAMINE HCL 50 MG/ML IJ SOLN
12.5000 mg | Freq: Once | INTRAMUSCULAR | Status: AC
  Administered 2021-06-01: 12.5 mg via INTRAVENOUS
  Filled 2021-06-01: qty 1

## 2021-06-01 MED ORDER — IOHEXOL 350 MG/ML SOLN
100.0000 mL | Freq: Once | INTRAVENOUS | Status: AC | PRN
  Administered 2021-06-01: 115 mL via INTRAVENOUS

## 2021-06-01 MED ORDER — INSULIN ASPART 100 UNIT/ML IJ SOLN
0.0000 [IU] | INTRAMUSCULAR | Status: DC
  Administered 2021-06-01: 2 [IU] via SUBCUTANEOUS
  Administered 2021-06-01 – 2021-06-02 (×2): 8 [IU] via SUBCUTANEOUS
  Administered 2021-06-02: 3 [IU] via SUBCUTANEOUS
  Administered 2021-06-02: 5 [IU] via SUBCUTANEOUS
  Administered 2021-06-02: 3 [IU] via SUBCUTANEOUS
  Administered 2021-06-02: 2 [IU] via SUBCUTANEOUS
  Administered 2021-06-03 (×2): 3 [IU] via SUBCUTANEOUS
  Administered 2021-06-03: 5 [IU] via SUBCUTANEOUS
  Filled 2021-06-01 (×10): qty 1

## 2021-06-01 MED ORDER — POLYETHYLENE GLYCOL 3350 17 G PO PACK: 17.0000 g | PACK | Freq: Every day | ORAL | Status: DC | PRN

## 2021-06-01 MED ORDER — ONDANSETRON HCL 4 MG/2ML IJ SOLN
4.0000 mg | Freq: Once | INTRAMUSCULAR | Status: AC
  Administered 2021-06-01: 4 mg via INTRAVENOUS
  Filled 2021-06-01: qty 2

## 2021-06-01 MED ORDER — PROCHLORPERAZINE EDISYLATE 10 MG/2ML IJ SOLN
10.0000 mg | Freq: Once | INTRAMUSCULAR | Status: AC
  Administered 2021-06-01: 10 mg via INTRAVENOUS
  Filled 2021-06-01: qty 2

## 2021-06-01 MED ORDER — FAMOTIDINE IN NACL 20-0.9 MG/50ML-% IV SOLN
20.0000 mg | INTRAVENOUS | Status: DC
  Administered 2021-06-01 – 2021-06-02 (×2): 20 mg via INTRAVENOUS
  Filled 2021-06-01 (×3): qty 50

## 2021-06-01 NOTE — ED Notes (Signed)
Pt is now reporting that the weakness is in the left side and that is having trouble walking because of the weakness. It was initially reported that the weakness was generalized and the numbness was on the right side of her body. Pt reports decreased sensation on the left side.  ?

## 2021-06-01 NOTE — ED Notes (Signed)
Pt reports that she now is experiencing a headache that is new since TNK. Pt reports pain 9/10. MD made aware.  ?

## 2021-06-01 NOTE — Consult Note (Signed)
PHARMACY CONSULT NOTE - FOLLOW UP ? ?Pharmacy Consult for Electrolyte Monitoring and Replacement  ? ?Recent Labs: ?Potassium (mmol/L)  ?Date Value  ?06/01/2021 3.6  ? ?Calcium (mg/dL)  ?Date Value  ?06/01/2021 8.9  ? ?Albumin (g/dL)  ?Date Value  ?06/01/2021 3.9  ? ?Sodium (mmol/L)  ?Date Value  ?06/01/2021 133 (L)  ?10/26/2016 143  ? ? ? ?Assessment: ?Patient admitted with s/sx of stroke. TNK given 5/9/ 2023. PMH includes HTN, COPD, diabetes, and HLD. Pharmacy consulted for electrolyte management. ? ?Goal of Therapy:  ?Electrolytes WNL ? ?Plan:  ?Noted patient slightly hyponatremic. No replacement indicated at this time. Patient received NS 1057ml. ?K 3.6 at low range of normal. Due to HTN and DM history, will replace with Kcl 24meq x 2 to target K of 4.0 ? ?Abbegayle Denault Rodriguez-Guzman PharmD, BCPS ?06/01/2021 12:36 PM ? ?

## 2021-06-01 NOTE — Plan of Care (Signed)
VSS. Afebrile. Denies pain, SOB, numbness, tingling. mNIHSS 0 with all q 1hr checks this shift. ANOx4. Transfer with SBA to [facility]. No acute events overnight.  ?Problem: Education: ?Goal: Knowledge of General Education information will improve ?Description: Including pain rating scale, medication(s)/side effects and non-pharmacologic comfort measures ?Outcome: Progressing ?  ?Problem: Health Behavior/Discharge Planning: ?Goal: Ability to manage health-related needs will improve ?Outcome: Progressing ?  ?Problem: Clinical Measurements: ?Goal: Ability to maintain clinical measurements within normal limits will improve ?Outcome: Progressing ?Goal: Will remain free from infection ?Outcome: Progressing ?Goal: Diagnostic test results will improve ?Outcome: Progressing ?Goal: Respiratory complications will improve ?Outcome: Progressing ?Goal: Cardiovascular complication will be avoided ?Outcome: Progressing ?  ?Problem: Activity: ?Goal: Risk for activity intolerance will decrease ?Outcome: Progressing ?  ?Problem: Nutrition: ?Goal: Adequate nutrition will be maintained ?Outcome: Progressing ?  ?Problem: Coping: ?Goal: Level of anxiety will decrease ?Outcome: Progressing ?  ?Problem: Elimination: ?Goal: Will not experience complications related to bowel motility ?Outcome: Progressing ?Goal: Will not experience complications related to urinary retention ?Outcome: Progressing ?  ?Problem: Pain Managment: ?Goal: General experience of comfort will improve ?Outcome: Progressing ?  ?Problem: Safety: ?Goal: Ability to remain free from injury will improve ?Outcome: Progressing ?  ?Problem: Skin Integrity: ?Goal: Risk for impaired skin integrity will decrease ?Outcome: Progressing ?  ?Problem: Education: ?Goal: Knowledge of disease or condition will improve ?Outcome: Progressing ?Goal: Knowledge of patient specific risk factors will improve (INDIVIDUALIZE FOR PATIENT) ?Outcome: Progressing ?  ?Problem: Health Behavior/Discharge  Planning: ?Goal: Ability to manage health-related needs will improve ?Outcome: Progressing ?  ?Problem: Self-Care: ?Goal: Ability to participate in self-care as condition permits will improve ?Outcome: Progressing ?Goal: Verbalization of feelings and concerns over difficulty with self-care will improve ?Outcome: Progressing ?  ?Problem: Ischemic Stroke/TIA Tissue Perfusion: ?Goal: Complications of ischemic stroke/TIA will be minimized ?Outcome: Progressing ?  ?Problem: Spontaneous Subarachnoid Hemorrhage Tissue Perfusion: ?Goal: Complications of Spontaneous Subarachnoid Hemorrhage will be minimized ?Outcome: Progressing ?  ?
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
## 2021-06-01 NOTE — ED Notes (Signed)
Code  stroke  called  to  [facility] 

## 2021-06-01 NOTE — Code Documentation (Signed)
Stroke Response Nurse Documentation ?Code Documentation ? ?Annette Norman is a 57 y.o. female arriving to [facility] ED via [employer] on 06/01/2021. On No antithrombotic. Code stroke was activated by ED Triage RN.  ? ?Patient from car, driving to work when she began having sudden rt sided chest pain and numbness suddenly at 0700. After patient arrived to ED this RN was told by the patient that she was now experiencing left sided weakness and numbness with difficulty walking.  ?Dr Quentin Cornwall had concern for aortic dissection with patient having chest pain so it was requested that pt have CT chest prior to thrombolytic. Once CT chest was cleared pt was ordered to have TNK.  ?TNK administered by this RN and the Pharmacist at [id number]. ? ?NIHSS 2, see documentation for details and code stroke times. CT, CTA head and neck. Patient is a candidate for IV Thrombolytic.  ?  ? ?Bedside handoff with ED RN Lilia Pro.   ? ?Velta Addison ?Stroke Coordinator RN ?  ?

## 2021-06-01 NOTE — Progress Notes (Signed)
Stroke alert cart activation at 0734 ?

## 2021-06-01 NOTE — ED Provider Notes (Signed)
? ?[facility] ?Provider Note ? ? ? Event Date/Time  ? First MD Initiated Contact with Patient 06/01/21 [phone]   ?  (approximate) ? ? ?History  ? ?Numbness ? ? ?HPI ? ?Annette Norman is a 56 y.o. female with a history of COPD diabetes fibromyalgia headaches with left-sided numbness previously evaluated MRI presents to the ER for evaluation of chest pain radiating to left arm associated with numbness and tingling of her face and left side.  Does have a smoking history.  Symptoms started around 7:00 this morning. ?  ? ? ?Physical Exam  ? ?Triage Vital Signs: ?ED Triage Vitals  ?Enc Vitals Group  ?   BP   ?   Pulse   ?   Resp   ?   Temp   ?   Temp src   ?   SpO2   ?   Weight   ?   Height   ?   Head Circumference   ?   Peak Flow   ?   Pain Score   ?   Pain Loc   ?   Pain Edu?   ?   Excl. in [place]?   ? ? ?Most recent vital signs: ?Vitals:  ? 06/01/21 0900 06/01/21 0912  ?BP: (!) 142/79 (!) 151/91  ?Pulse:  95  ?Resp:  15  ?Temp:  98 ?F (36.7 ?C)  ?SpO2:  95%  ? ? ? ?Constitutional: Alert  ?Eyes: Conjunctivae are normal.  ?Head: Atraumatic. ?Nose: No congestion/rhinnorhea. ?Mouth/Throat: Mucous membranes are moist.   ?Neck: Painless ROM.  ?Cardiovascular:   Good peripheral circulation. ?Respiratory: Normal respiratory effort.  No retractions.  ?Gastrointestinal: Soft and nontender.  ?Musculoskeletal:  no deformity ?Neurologic:  CN- intact.  No facial droop, Normal FNF.  Normal heel to shin.  Sensation intact bilaterally, no extinction. Normal speech and language. No gross focal neurologic deficits are appreciated.  ?Skin:  Skin is warm, dry and intact. No rash noted. ?Psychiatric: Mood and affect are normal. Speech and behavior are normal. ? ? ? ?ED Results / Procedures / Treatments  ? ?Labs ?(all labs ordered are listed, but only abnormal results are displayed) ?Labs Reviewed  ?CBC - Abnormal; Notable for the following components:  ?    Result Value  ? RBC 5.50 (*)   ? MCH 25.8 (*)   ? All other  components within normal limits  ?COMPREHENSIVE METABOLIC PANEL - Abnormal; Notable for the following components:  ? Sodium 133 (*)   ? Glucose, Bld 349 (*)   ? All other components within normal limits  ?CBG MONITORING, ED - Abnormal; Notable for the following components:  ? Glucose-Capillary 412 (*)   ? All other components within normal limits  ?DIFFERENTIAL  ?PROTIME-INR  ?APTT  ?I-STAT CREATININE, ED  ?CBG MONITORING, ED  ?TROPONIN I (HIGH SENSITIVITY)  ? ? ? ?EKG ? ?ED ECG REPORT ?I, Merlyn Lot, the attending physician, personally viewed and interpreted this ECG. ? ? Date: 06/01/2021 ? EKG Time: 7:24 ? Rate: 100 ? Rhythm: sinus ? Axis: normal ? Intervals: normal intervals ? ST&T Change:  no stemi, no depressions, poor r wave progression ? ? ? ?RADIOLOGY ?Please see ED Course for my review and interpretation. ? ?I personally reviewed all radiographic images ordered to evaluate for the above acute complaints and reviewed radiology reports and findings.  These findings were personally discussed with the patient.  Please see medical record for radiology report. ? ? ? ?PROCEDURES: ? ?Critical  Care performed: Yes, see critical care procedure note(s) ? ?.Critical Care ?Performed by: Merlyn Lot, MD ?Authorized by: Merlyn Lot, MD  ? ?Critical care provider statement:  ?  Critical care time (minutes):  35 ?  Critical care was necessary to treat or prevent imminent or life-threatening deterioration of the following conditions:  CNS failure or compromise ?  Critical care was time spent personally by me on the following activities:  Ordering and performing treatments and interventions, ordering and review of laboratory studies, ordering and review of radiographic studies, pulse oximetry, re-evaluation of patient's condition, review of old charts, obtaining history from patient or surrogate, examination of patient, evaluation of patient's response to treatment, discussions with primary provider,  discussions with consultants and development of treatment plan with patient or surrogate ? ? ?MEDICATIONS ORDERED IN ED: ?Medications  ?sodium chloride flush (NS) 0.9 % injection 3 mL (has no administration in time range)  ?sodium chloride 0.9 % bolus 1,000 mL (has no administration in time range)  ?tenecteplase (TNKASE) 50 MG injection for Stroke (  Not Given 06/01/21 0922)  ?morphine (PF) 4 MG/ML injection 4 mg (has no administration in time range)  ?ondansetron (ZOFRAN) injection 4 mg (has no administration in time range)  ?iohexol (OMNIPAQUE) 350 MG/ML injection 100 mL (115 mLs Intravenous Contrast Given 06/01/21 0832)  ?tenecteplase (TNKASE) injection for Stroke 19 mg (19 mg Intravenous Given 06/01/21 0841)  ? ? ? ?IMPRESSION / MDM / ASSESSMENT AND PLAN / ED COURSE  ?I reviewed the triage vital signs and the nursing notes. ?             ?               ? ?Differential diagnosis includes, but is not limited to, cva, tia, hypoglycemia, dehydration, electrolyte abnormality, dissection, sepsis ? ?Patient presenting with symptoms as described above.  Was made code stroke out of triage.  Teleneurology evaluating patient and recommending TNK.  Given her chest pain that is clearly causing her some discomfort with nonischemic appearing EKG I did recommend obtaining CTA chest to rule out acute dissection to review prior to TNK administration.  Patient and neurology agreed to plan. ? ?Clinical Course as of 06/01/21 0928  ?Tue Jun 01, 2021  ?0818 CT head on my review and interpretation shows no acute intracranial abnormality.   [PR]  ?0834 CTA on my review and interpretation does not show evidence of dissection. [PR]  ?[id number] Discussed case in consultation with Dr. Reymundo Poll of radiology who on preliminary read does not see evidence of dissection or acute aortic syndrome on preliminary read.  Formal report to be completed shortly. [PR]  ?0925 Patient was complaining of headache post TNK.  Repeat CT head without any evidence of  hemorrhage.  She is neuro intact.  Does appear stable and appropriate for admission to the hospital at this time.  I have consulted with ICU for admission. [PR]  ?  ?Clinical Course User Index ?[PR] Merlyn Lot, MD  ? ? ? ?FINAL CLINICAL IMPRESSION(S) / ED DIAGNOSES  ? ?Final diagnoses:  ?Left sided numbness  ? ? ? ?Rx / DC Orders  ? ?ED Discharge Orders   ? ? None  ? ?  ? ? ? ?Note:  This document was prepared using Dragon voice recognition software and may include unintentional dictation errors. ? ?  ?Merlyn Lot, MD ?06/01/21 [phone] ? ?

## 2021-06-01 NOTE — ED Notes (Signed)
CODE STROKE called to Network engineer and [employer] informed.  ? ?Pt taken to CT by Charge RN Levada Dy ?

## 2021-06-01 NOTE — ED Triage Notes (Addendum)
Pt come with c/o dizziness and numbness that started about 15 minutes ago. Pt states right arm numbness. Pt states blurry vision. Pt states weakness all over.  ? ?Pt also states CP that started while driving to work. Pt states it is intense. ?

## 2021-06-01 NOTE — Consult Note (Signed)
[facility] ?[facility] ? ? ?Patient Name:   Mirelys, Guthrie. ?Date of Birth:   05/28/1965 ?Identification Number:   MRN - [id number] ?Date of Service:   06/01/2021 07:42:49 ? ?Diagnosis: ?      R53.1 - Weakness ?      R26.81 - Unsteady gait ? ?Impression: ?     This is a 56 year old female with recurrent bouts of chest pain, but the patient is having trouble with left sided weakness and in addition, the patient is not able to move the legs. ? ?Our recommendations are outlined below. ?Recommendations: ?IV Tenecteplase recommended. ? ?I confirmed the following. ?(Patient name, DOB, MRN, dose of Thrombolytic and waste, weight completed by stretcher/scale not stated weight, have ED staff inform ED MD of thrombolytic decision) ?Thrombolytic bolus given Without Complication. ? ? ?IV Tenecteplase Total Dose - 19.2 mg ? ? ?Routine post Thrombolytic monitoring including neuro checks and blood pressure control during/after treatment Monitor blood pressure Check blood pressure and neuro assessment every 15 min for 2 h, then every 30 min for 6 h, and finally every hour for 16 h. ? ?Manage Blood Pressure per post Thrombolytic protocol. ? ?      Follow designated hospital protocol for admission and post thrombolytic care ?      CT brain 24 hours post Thrombolytic ?      NPO until swallowing screen performed and passed ?      No antiplatelet agents or anticoagulants (including heparin for DVT prophylaxis) in first 24 hours ?      No Foley catheter, nasogastric tube, arterial catheter or central venous catheter for 24 hr, unless absolutely necessary ?      Telemetry ?      Bedside swallow evaluation ?      HOB less than 30 degrees ?      Euglycemia ?      Avoid hyperthermia, PRN acetaminophen ?      DVT prophylaxis ?      Inpatient Neurology Consultation ?      Stroke evaluation as per inpatient neurology recommendations ? ?Discussed with ED  physician ? ? ? ?------------------------------------------------------------------------------ ? ?Advanced Imaging: ?CTA Head and Neck Completed. ? ?LVO:No ? ?Patient doesn't meet criteria for emergent NIR consideration ? ? ?Metrics: ?Last Known Well: 06/01/2021 07:00:00 ?[facility] Notification Time: 06/01/2021 07:42:49 ?Arrival Time: 06/01/2021 07:16:00 ?Stamp Time: 06/01/2021 07:42:49 ?Initial Response Time: 06/01/2021 07:44:49 ?Symptoms: Left sided sensation changes.Marland Kitchen ?NIHSS Start Assessment Time: 06/01/2021 07:47:45 ?Patient is a candidate for Thrombolytic. ?Thrombolytic Medical Decision: 06/01/2021 07:56:19 ?Needle Time: 06/01/2021 08:41:00 ?Weight Noted by Staff: 76.8 kg ? ?CT head showed no acute hemorrhage or acute core infarct. ? ?Primary Provider Notified of Diagnostic Impression and Management Plan on: 06/01/2021 08:45:14 ? ? ? ?------------------------------------------------------------------------------ ? ?Thrombolytic Contraindications: ? ?Last Known Well > 4.5 hours: No ?CT Head showing hemorrhage: No ?Ischemic stroke within 3 months: No ?Severe head trauma within 3 months: No ?Intracranial/intraspinal surgery within 3 months: No ?History of intracranial hemorrhage: No ?Symptoms and signs consistent with an SAH: No ?GI malignancy or GI bleed within 21 days: No ?Coagulopathy: Platelets <100 000 /mm3, INR >1.7, aPTT>40 s, or PT >15 s: No ?Treatment dose of LMWH within the previous 24 hrs: No ?Use of NOACs in past 48 hours: No ?Glycoprotein IIb/IIIa receptor inhibitors use: No ?Symptoms consistent with infective endocarditis: No ?Suspected aortic arch dissection: No ?Intra-axial intracranial neoplasm: No ? ?Thrombolytic Decision and Management Plan: ?Management with thrombolytic treatment was explained to  the Patient as was risks and benefits and alternatives to the treatment. Patient agrees with the decision to proceed with thrombolytic treatment. Marland Kitchen ?All questions were answered and the Patient  expressed understanding of the treatment plan. ? ? ?History of Present Illness: ?Patient is a 56 year old Female. ? ?Patient was brought by EMS for symptoms of Left sided sensation changes.Marland Kitchen ?This is a 56 year old female with a PMH significant for chest pain as well as left sided symptoms presenting to the hospital with left face, arm, and leg numbness with difficulty being able to ambulate and she states these symptoms all started at 7 AM. ? ? ?Past Medical History: ?     Diabetes Mellitus ?     Hyperlipidemia ?Othere PMH:  CKD Stage III ? ?Medications: ? ?No Anticoagulant use  ?Antiplatelet use: Yes ASA 81 mg daily. ?Reviewed EMR for current medications ? ?Allergies:  ?Reviewed ? ?Social History: ?Smoking: No ?Alcohol Use: No ?Drug Use: No ? ?Family History: ? ?There is no family history of premature cerebrovascular disease pertinent to this consultation ? ?ROS : ?14 Points Review of Systems was performed and was negative except mentioned in HPI. ? ?Past Surgical History: ?There Is No Surgical History Contributory To Today?s Visit ? ? ?Examination: ?BP(137/84), Pulse(114), Blood Glucose(412) ?1A: Level of Consciousness - Alert; keenly responsive + 0 ?1B: Ask Month and Age - Both Questions Right + 0 ?1C: Blink Eyes & Squeeze Hands - Performs Both Tasks + 0 ?2: Test Horizontal Extraocular Movements - Normal + 0 ?3: Test Visual Fields - No Visual Loss + 0 ?4: Test Facial Palsy (Use Grimace if Obtunded) - Normal symmetry + 0 ?5A: Test Left Arm Motor Drift - No Drift for 10 Seconds + 0 ?5B: Test Right Arm Motor Drift - No Drift for 10 Seconds + 0 ?6A: Test Left Leg Motor Drift - Drift, but doesn't hit bed + 1 ?6B: Test Right Leg Motor Drift - No Drift for 5 Seconds + 0 ?7: Test Limb Ataxia (FNF/Heel-Shin) - No Ataxia + 0 ?8: Test Sensation - Mild-Moderate Loss: Can Sense Being Touched + 1 ?9: Test Language/Aphasia - Normal; No aphasia + 0 ?10: Test Dysarthria - Normal + 0 ?11: Test Extinction/Inattention - No  abnormality + 0 ? ?NIHSS Score: 2 ?NIHSS Free Text : Left arm, leg different. Can't feel it in the face, feels numb. ? ?Pre-Morbid Modified Rankin Scale: ?0 Points = No symptoms at all ? ? ?Patient/Family was informed the Neurology Consult would occur via TeleHealth consult by way of interactive audio and video telecommunications and consented to receiving care in this manner. ? ? ?Patient is being evaluated for possible acute neurologic impairment and high probability of imminent or life-threatening deterioration. I spent total of 70 minutes providing care to this patient, including time for face to face visit via telemedicine, review of medical records, imaging studies and discussion of findings with providers, the patient and/or family. ? ? ?Dr Kennon Portela ? ? ?[facility] ?[phone] ? ?Case [id number] ? ?

## 2021-06-01 NOTE — Progress Notes (Signed)
Neurologist, Dr Benay Spice, joins the telemedicine cart at [phone] ?
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
## 2021-06-02 ENCOUNTER — Inpatient Hospital Stay: Payer: Medicare Other

## 2021-06-02 DIAGNOSIS — G4489 Other headache syndrome: Secondary | ICD-10-CM

## 2021-06-02 LAB — BASIC METABOLIC PANEL
Anion gap: 3 — ABNORMAL LOW (ref 5–15)
BUN: 12 mg/dL (ref 6–20)
CO2: 24 mmol/L (ref 22–32)
Calcium: 8.6 mg/dL — ABNORMAL LOW (ref 8.9–10.3)
Chloride: 112 mmol/L — ABNORMAL HIGH (ref 98–111)
Creatinine, Ser: 0.68 mg/dL (ref 0.44–1.00)
GFR, Estimated: >60 mL/min (ref >60)
Glucose, Bld: 160 mg/dL — ABNORMAL HIGH (ref 70–99)
Potassium: 3.6 mmol/L (ref 3.5–5.1)
Sodium: 139 mmol/L (ref 135–145)

## 2021-06-02 LAB — CBC WITH DIFFERENTIAL/PLATELET
Abs Immature Granulocytes: 0.04 10*3/uL (ref 0.00–0.07)
Basophils Absolute: 0.1 10*3/uL (ref 0.0–0.1)
Basophils Relative: 1 %
Eosinophils Absolute: 0.1 10*3/uL (ref 0.0–0.5)
Eosinophils Relative: 2 %
HCT: 42.5 % (ref 36.0–46.0)
Hemoglobin: 13.7 g/dL (ref 12.0–15.0)
Immature Granulocytes: 1 %
Lymphocytes Relative: 31 %
Lymphs Abs: 1.9 10*3/uL (ref 0.7–4.0)
MCH: 26.6 pg (ref 26.0–34.0)
MCHC: 32.2 g/dL (ref 30.0–36.0)
MCV: 82.4 fL (ref 80.0–100.0)
Monocytes Absolute: 0.8 10*3/uL (ref 0.1–1.0)
Monocytes Relative: 13 %
Neutro Abs: 3.2 10*3/uL (ref 1.7–7.7)
Neutrophils Relative %: 52 %
Platelets: 211 10*3/uL (ref 150–400)
RBC: 5.16 MIL/uL — ABNORMAL HIGH (ref 3.87–5.11)
RDW: 13.1 % (ref 11.5–15.5)
WBC: 6.1 10*3/uL (ref 4.0–10.5)
nRBC: 0 % (ref 0.0–0.2)

## 2021-06-02 LAB — ECHOCARDIOGRAM COMPLETE
AR max vel: 1.34 cm2
AV Area VTI: 1.35 cm2
AV Area mean vel: 1.35 cm2
AV Mean grad: 3 mmHg
AV Peak grad: 6 mmHg
Ao pk vel: 1.22 m/s
Area-P 1/2: 3.6 cm2
MV VTI: 1.25 cm2
S' Lateral: 2.65 cm
Weight: 2709.01 oz

## 2021-06-02 LAB — GLUCOSE, CAPILLARY
Glucose-Capillary: 149 mg/dL — ABNORMAL HIGH (ref 70–99)
Glucose-Capillary: 158 mg/dL — ABNORMAL HIGH (ref 70–99)
Glucose-Capillary: 185 mg/dL — ABNORMAL HIGH (ref 70–99)
Glucose-Capillary: 222 mg/dL — ABNORMAL HIGH (ref 70–99)
Glucose-Capillary: 224 mg/dL — ABNORMAL HIGH (ref 70–99)
Glucose-Capillary: 288 mg/dL — ABNORMAL HIGH (ref 70–99)
Glucose-Capillary: 290 mg/dL — ABNORMAL HIGH (ref 70–99)

## 2021-06-02 LAB — PHOSPHORUS: Phosphorus: 3.8 mg/dL (ref 2.5–4.6)

## 2021-06-02 LAB — HIV ANTIBODY (ROUTINE TESTING W REFLEX): HIV Screen 4th Generation wRfx: NONREACTIVE

## 2021-06-02 LAB — MAGNESIUM: Magnesium: 2.4 mg/dL (ref 1.7–2.4)

## 2021-06-02 MED ORDER — ROSUVASTATIN CALCIUM 20 MG PO TABS
40.0000 mg | ORAL_TABLET | Freq: Every day | ORAL | Status: DC
  Administered 2021-06-02: 40 mg via ORAL
  Filled 2021-06-02: qty 2

## 2021-06-02 MED ORDER — ACETAMINOPHEN 325 MG PO TABS
650.0000 mg | ORAL_TABLET | Freq: Four times a day (QID) | ORAL | Status: DC | PRN
  Administered 2021-06-02: 650 mg via ORAL
  Filled 2021-06-02: qty 2

## 2021-06-02 MED ORDER — HYDROCODONE-ACETAMINOPHEN 5-325 MG PO TABS
1.0000 | ORAL_TABLET | Freq: Four times a day (QID) | ORAL | Status: DC | PRN
Start: 2021-06-02 — End: 2021-06-03
  Filled 2021-06-02 (×2): qty 1

## 2021-06-02 MED ORDER — INSULIN GLARGINE-YFGN 100 UNIT/ML ~~LOC~~ SOLN
15.0000 [IU] | Freq: Every day | SUBCUTANEOUS | Status: DC
  Administered 2021-06-02: 15 [IU] via SUBCUTANEOUS
  Filled 2021-06-02 (×2): qty 0.15

## 2021-06-02 MED ORDER — GABAPENTIN 300 MG PO CAPS
300.0000 mg | ORAL_CAPSULE | Freq: Two times a day (BID) | ORAL | Status: DC
  Administered 2021-06-02 – 2021-06-03 (×3): 300 mg via ORAL
  Filled 2021-06-02 (×3): qty 1

## 2021-06-02 MED ORDER — CHLORHEXIDINE GLUCONATE CLOTH 2 % EX PADS
6.0000 | MEDICATED_PAD | Freq: Every day | CUTANEOUS | Status: DC
  Administered 2021-06-02 – 2021-06-03 (×2): 6 via TOPICAL

## 2021-06-02 MED ORDER — TOPIRAMATE 100 MG PO TABS
100.0000 mg | ORAL_TABLET | Freq: Two times a day (BID) | ORAL | Status: DC
  Administered 2021-06-02 – 2021-06-03 (×3): 100 mg via ORAL
  Filled 2021-06-02 (×3): qty 1

## 2021-06-02 MED ORDER — BUSPIRONE HCL 10 MG PO TABS
10.0000 mg | ORAL_TABLET | Freq: Every day | ORAL | Status: DC
  Administered 2021-06-02: 10 mg via ORAL
  Filled 2021-06-02: qty 1

## 2021-06-02 MED ORDER — EMPAGLIFLOZIN 10 MG PO TABS
10.0000 mg | ORAL_TABLET | Freq: Every day | ORAL | Status: DC
Start: 2021-06-02 — End: 2021-06-03
  Administered 2021-06-02 – 2021-06-03 (×2): 10 mg via ORAL
  Filled 2021-06-02 (×2): qty 1

## 2021-06-02 MED ORDER — FLUOXETINE HCL 20 MG PO CAPS
40.0000 mg | ORAL_CAPSULE | Freq: Every day | ORAL | Status: DC
  Administered 2021-06-02: 40 mg via ORAL
  Filled 2021-06-02: qty 2

## 2021-06-02 MED ORDER — POTASSIUM CHLORIDE CRYS ER 20 MEQ PO TBCR
40.0000 meq | EXTENDED_RELEASE_TABLET | Freq: Once | ORAL | Status: AC
  Administered 2021-06-02: 40 meq via ORAL
  Filled 2021-06-02: qty 2
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
## 2021-06-02 NOTE — Final Consult Note (Signed)
PHARMACY CONSULT NOTE - FOLLOW UP ? ?Pharmacy Consult for Electrolyte Monitoring and Replacement  ? ?Recent Labs: ?Potassium (mmol/L)  ?Date Value  ?06/02/2021 3.6  ? ?Magnesium (mg/dL)  ?Date Value  ?06/02/2021 2.4  ? ?Calcium (mg/dL)  ?Date Value  ?06/02/2021 8.6 (L)  ? ?Albumin (g/dL)  ?Date Value  ?06/01/2021 3.9  ? ?Phosphorus (mg/dL)  ?Date Value  ?06/02/2021 3.8  ? ?Sodium (mmol/L)  ?Date Value  ?06/02/2021 139  ?10/26/2016 143  ? ? ? ?Assessment: ?Patient admitted with s/sx of stroke. TNK given 5/9/ 2023. PMH includes HTN, COPD, diabetes, and HLD. Pharmacy consulted for electrolyte management. ? ?Goal of Therapy:  ?Electrolytes WNL ? ?Plan:  ?Pt stable and transfer from CCU > step-down. Will remove consult per protocol upon phase of care shift. Please reconsult if further management by pharmacy desired.  ? ?Na+ now WNL, Scr stabilized,  ?K 3.6>3.6  despite Kcl IV x2 on 5/09 >> will replete with KCL PO x1 based on history to target goal >4 ? ?Martyn Malay, PharmD, BCCP ?Clinical Pharmacist ?06/02/2021 1:42 PM ? ? ?

## 2021-06-02 NOTE — Progress Notes (Addendum)
Inpatient Diabetes Program Recommendations ? ?AACE/ADA: New Consensus Statement on Inpatient Glycemic Control (2015) ? ?Target Ranges:  Prepandial:   less than 140 mg/dL ?     Peak postprandial:   less than 180 mg/dL (1-2 hours) ?     Critically ill patients:  140 - 180 mg/dL  ? ?Lab Results  ?Component Value Date  ? GLUCAP 149 (H) 06/02/2021  ? HGBA1C 12.8 (H) 06/01/2021  ? ? ?Review of Glycemic Control ? Latest Reference Range & Units 06/01/21 13:06 06/01/21 16:18 06/01/21 19:34 06/01/21 23:59 06/02/21 03:35 06/02/21 07:19  ?Glucose-Capillary 70 - 99 mg/dL 010 (H) 272 (H) 536 (H) 288 (H) 158 (H) 149 (H)  ? ?Diabetes history: DM 2 ?Outpatient Diabetes medications:  ?Humalog SSI ?Levemir 50 units q HS ?Jardiance 10 mg daily ?Ozempic weekly- Patient is unsure of dose?  ?Current orders for Inpatient glycemic control:  ?Novolog moderate tid with meals ?Inpatient Diabetes Program Recommendations:   ? ?A1C is markedly increased.  It appears patient was on insulin at home.  Will discuss with patient when appropriate.  ?Consider adding Semglee 15 units daily.   ? ?Thanks,  ?Beryl Meager, RN, BC-ADM ?Inpatient Diabetes Coordinator ?Pager [phone]  (8a-5p) ? ?Addendum: Spoke with patient and she states that A1C has been as high as 14% in the past.  She does take insulin and has been referred to see Endocrinology.  She takes up to 4 shots a day and states that she does not like checking her blood sugars.  We discussed potential for CGM.  She has used Dexcom sensor in the past but that her insurance would not pay.  She is therefore interested in Freestyle CGM at discharge.  DM coordinator has samples for patient that can be applied prior to discharge if okay with MD. Will follow. ? ? ? ?
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
## 2021-06-02 NOTE — Progress Notes (Signed)
Neurology Progress Note ? ?Patient ID: Annette Norman is a right handed 56 y.o. 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 ?Subjective: ?- Symptoms remain resolved including HA ?- No new symptoms ?- Eager for discharge  ? ?Exam: ?Vitals:  ? 06/02/21 0800 06/02/21 0810  ?BP: 106/62   ?Pulse: 80   ?Resp: 13   ?Temp:  98.6 ?F (37 ?C)  ?SpO2: 91%   ? ?Gen: In bed, comfortable  ?Resp: non-labored breathing, no grossly audible wheezing ?Cardiac: Perfusing extremities well  ?Abd: soft, nt ? ?Neuro: ?MS: Awake, alert, oriented, speech fluent, follows simple commands ?CN: Left eye blind non-reactive pupil, right eye pupil reactive, EOMI, Face symmetric, tongue midline ?Motor: No pronator drift. 5/5 throughout ?Sensory: Intact to light touch throughout  ? ?Pertinent Data: ?Lab Results  ?Component Value Date  ? CHOL 201 (H) 06/01/2021  ? HDL 32 (L) 06/01/2021  ? LDLCALC 112 (H) 06/01/2021  ? TRIG 286 (H) 06/01/2021  ? CHOLHDL 6.3 06/01/2021  ? ?Lab Results  ?Component Value Date  ? HGBA1C 12.8 (H) 06/01/2021  ? ? ?ECHO:   ?1. Left ventricular ejection fraction, by estimation, is 60 to 65%. The  ?left ventricle has normal function. The left ventricle has no regional  ?wall motion abnormalities. Left ventricular diastolic parameters were  ?normal.  ? 2. Right ventricular systolic function is normal. The right ventricular  ?size is normal.  ? 3. The mitral valve is normal in structure. Mild mitral valve  ?regurgitation.  ? 4. The aortic valve is normal in structure. Aortic valve regurgitation is  ?not visualized.  ? ?MRI brain pending  ? ?Impression: Likely complex migraine given history, less likely stroke, MRI brain pending ? ?Recommendations: ?- BP Goal normotension ?- A1c and LDL outside of goal (<7% and <70  respectively), patient counseled on goals and has plan for more intensive outpatient managment ?- Diet, exercise and medication adherence counseling completed  ?- Neurology will follow up MRI otherwise available on an as needed basis ? - If MRI negative for significant hemorrhage, will restart home lifelong ASA 81 mg, if positive for ischemia will consider addition of plavix (for 21 day course of DAPT) otherwise will remain on ASA monotherapy ?- No further inpatient neurological workup indicated other than MRI at this time ? ?Brooke Dare MD-PhD ?[facility] ?[phone]  ?[facility] coverage for [facility] is from 8 AM to 4 AM in-house and 4 PM to 8 PM by telephone/video. 8 PM to 8 AM emergent questions or overnight urgent questions should be addressed to Teleneurology On-call or [facility] neurohospitalist; contact information can be found on AMION ? ? ? ?

## 2021-06-02 NOTE — Progress Notes (Signed)
Per Nurse Madison @ 12:10 am , short staff and unable to leave the floor. Per Dr. Margaretmary Dys until 06/02/21 in the afternoon for mri. Pt got TKA on 06/01/21 @ 8:40am. thanks ?

## 2021-06-03 DIAGNOSIS — G43809 Other migraine, not intractable, without status migrainosus: Secondary | ICD-10-CM

## 2021-06-03 LAB — THYROID PANEL WITH TSH
Free Thyroxine Index: 2.3 (ref 1.2–4.9)
T3 Uptake Ratio: 28 % (ref 24–39)
T4, Total: 8.2 ug/dL (ref 4.5–12.0)
TSH: 1.11 u[IU]/mL (ref 0.450–4.500)

## 2021-06-03 LAB — GLUCOSE, CAPILLARY
Glucose-Capillary: 180 mg/dL — ABNORMAL HIGH (ref 70–99)
Glucose-Capillary: 181 mg/dL — ABNORMAL HIGH (ref 70–99)
Glucose-Capillary: 224 mg/dL — ABNORMAL HIGH (ref 70–99)

## 2021-06-03 MED ORDER — MELOXICAM 15 MG PO TABS: 15.0000 mg | ORAL_TABLET | Freq: Every day | ORAL | Status: AC | PRN

## 2021-06-03 MED ORDER — HYDROCODONE-ACETAMINOPHEN 10-325 MG PO TABS: 1.0000 | ORAL_TABLET | Freq: Four times a day (QID) | ORAL | 0 refills | Status: AC | PRN

## 2021-06-03 NOTE — Progress Notes (Signed)
Pt A/Ox4 upon review of AVS. Work note given to patient. Diabetes educator meeting with patient. PIV removed. No further needs.  ?

## 2021-06-03 NOTE — Care Management (Signed)
?  Transition of Care (TOC) Screening Note ? ? ?Patient Details  ?Name: Annette Norman ?Date of Birth: 1965/08/20 ? ? ?Transition of Care (TOC) CM/SW Contact:    ?Pete Pelt, RN ?Phone Number: ?06/03/2021, 10:14 AM ? ? ? ?Transition of Care Department [facility]) has reviewed patient and no TOC needs have been identified at this time. We will continue to monitor patient advancement through interdisciplinary progression rounds. If new patient transition needs arise, please place a TOC consult. ?  ?

## 2021-06-03 NOTE — Progress Notes (Deleted)
Ms. Linna Hoff from [facility] is here requesting a "code" for Mychart. Patient currently lives at [facility] group home. Code given to Ms. Linna Hoff. Another Visitor in room from group home with patient waiting for MD visit.  ?

## 2021-06-03 NOTE — Progress Notes (Addendum)
Inpatient Diabetes Program Recommendations ? ?AACE/ADA: New Consensus Statement on Inpatient Glycemic Control (2015) ? ?Target Ranges:  Prepandial:   less than 140 mg/dL ?     Peak postprandial:   less than 180 mg/dL (1-2 hours) ?     Critically ill patients:  140 - 180 mg/dL  ? ?Lab Results  ?Component Value Date  ? GLUCAP 181 (H) 06/03/2021  ? HGBA1C 12.8 (H) 06/01/2021  ? ? ?Review of Glycemic Control ? Latest Reference Range & Units 06/02/21 20:18 06/03/21 00:35 06/03/21 04:27 06/03/21 07:42  ?Glucose-Capillary 70 - 99 mg/dL 814 (H) 481 (H) 856 (H) 181 (H)  ? ?Diabetes history: DM 2 ?Outpatient Diabetes medications:  ?Humalog SSI ?Levemir 50 units q HS ?Jardiance 10 mg daily ?Ozempic weekly- Patient is unsure of dose?  ?Current orders for Inpatient glycemic control:  ?Novolog moderate q 4 hours ?Semglee 15 units daily ? ?Inpatient Diabetes Program Recommendations:   ? ?MD ordered application of Freestyle CGM at discharge for patient. Education done regarding application and changing CGM sensor (alternate every 14 days on back of arms), 1 hour warm-up, use of glucometer when alert displays, how to scan CGM for glucose reading and information for PCP. Patient has also been given educational packet regarding use CGM sensor including the 1-800 toll free number for any questions, problems or needs related to the [employer] [facility] sensors or reader.    Sensor applied by patient to (R) Arm at 11am.  Explained that glucose readings will not be available until 1 hour after application. Reviewed use of CGM including how to scan, changing Sensor, Vitamin C warning, arrows with glucose readings, and Freestyle app.  Patient discharging home and plans to f/u with PCP.  ? ?Thanks,  ?Beryl Meager, RN, BC-ADM ?Inpatient Diabetes Coordinator ?Pager [phone]  (8a-5p) ? ? ?

## 2021-06-07 NOTE — Discharge Summary (Signed)
? ?Physician Discharge Summary ? ? ?Annette Norman  female DOB: 1965/06/24  ?YOV:[id number] ? ?PCP: Secundino Ginger, PA-C ? ?Admit date: 06/01/2021 ?Discharge date: 06/03/2021 ? ?Admitted From: home ?Disposition:  home ?CODE STATUS: Full code ? ? ?Hospital Course:  ?For full details, please see H&P, progress notes, consult notes and ancillary notes.  ?Briefly,  ?Annette Norman is a 56 yo female who presented to [facility] ER on 05/9 with c/o chest pain radiating to the left arm along with numbness, headache, and tingling on the left side of her face.  Onset of symptoms were 07:00 am the morning of 05/9.  She does have a hx of recurrent chest pain and complicated migraine.   ?  ?ED Course ?In the ER code stroke initiated. CT Head revealed no acute findings, revealed chronic small vessel ischemia.   CTA Head/Neck negative for emergent large vessel occlusion or proximal hemodynamically significant stenosis in the head or neck.  Per tele-neurology recommendations TNK administered.  PCCM team contacted for ICU admission.  Pt was monitored in ICU overnight and transferred to hospitalist service on 5/11 and then discharged. ? ?Left-sided weakness/numbness and headache suspected secondary to complicated migraine ?--MRI brain neg for acute stroke. ?--all symptoms resolved prior to discharge. ?  ?COPD, stable ?Hx: OSA ?- Prn bronchodilator therapy ?- CPAP qhs  ?  ?Type II diabetes mellitus, poorly controlled ?--A1c 12.8.  diabetic educator met with pt. ?--discharged on diabetic regimen as below.  Outpatient f/u for better management of diabetes. ? ?Hx of Migraines ?- cont home gabapentin, topiramate, keppra ?  ?Hx of anxiety/depression ?- cont home buspar and prozac ? ? ?Discharge Diagnoses:  ?Principal Problem: ?  Stroke [facility]) ? ? ?30 Day Unplanned Readmission Risk Score   ? ?Flowsheet Row ED to Hosp-Admission (Discharged) from 06/01/2021 in [place]  ?30 Day Unplanned Readmission Risk  Score (%) 14.21 Filed at 06/03/2021 0801  ? ?  ? ? This score is the patient's risk of an unplanned readmission within 30 days of being discharged (0 -100%). The score is based on dignosis, age, lab data, medications, orders, and past utilization.   ?Low:  0-14.9   Medium: 15-21.9   High: 22-29.9   Extreme: 30 and above ? ?  ? ?  ? ? ?Discharge Instructions: ? ?Allergies as of 06/03/2021   ? ?   Reactions  ? Bee Venom Anaphylaxis  ? Bee Venom Anaphylaxis  ? Amitriptyline Hives, Rash  ? Nortriptyline Hives  ? Amitriptyline Other (See Comments)  ? whelps  ? Norco [hydrocodone-acetaminophen] Itching, Other (See Comments)  ? Can also tolerate Percocet, but MUST "pre-medicate" with Benadryl  ? Nortriptyline Other (See Comments)  ? whelps  ? Zithromax [azithromycin] Hives  ? ?  ? ?  ?Medication List  ?  ? ?STOP taking these medications   ? ?benzonatate 100 MG capsule ?Commonly known as: TESSALON ?  ?diclofenac sodium 1 % Gel ?Commonly known as: VOLTAREN ?  ?ondansetron 8 MG disintegrating tablet ?Commonly known as: ZOFRAN-ODT ?  ?rizatriptan 10 MG tablet ?Commonly known as: MAXALT ?  ? ?  ? ?TAKE these medications   ? ?Aimovig 70 MG/ML Soaj ?Generic drug: Erenumab-aooe ?Inject into the skin every 30 (thirty) days. ?  ?aspirin EC 81 MG tablet ?Take 81 mg by mouth at bedtime. ?  ?brimonidine-timolol 0.2-0.5 % ophthalmic solution ?Commonly known as: COMBIGAN ?Place 1 drop into the left eye 2 (two) times daily. ?  ?busPIRone 10  MG tablet ?Commonly known as: BUSPAR ?Take 10 mg by mouth at bedtime. ?  ?Crestor 40 MG tablet ?Generic drug: rosuvastatin ?Take 40 mg by mouth at bedtime. ?What changed: Another medication with the same name was removed. Continue taking this medication, and follow the directions you see here. ?  ?Dexilant 60 MG capsule ?Generic drug: dexlansoprazole ?Take 1 capsule by mouth at bedtime. ?  ?EPINEPHrine 0.3 mg/0.3 mL Soaj injection ?Commonly known as: EPI-PEN ?Inject 0.3 mg into the muscle as needed for  anaphylaxis. ?  ?esomeprazole 40 MG capsule ?Commonly known as: [place] ?Take 40 mg by mouth 2 (two) times daily before a meal. ?  ?famotidine 20 MG tablet ?Commonly known as: PEPCID ?Take 20 mg by mouth at bedtime. ?  ?FLUoxetine 40 MG capsule ?Commonly known as: PROZAC ?Take 40 mg by mouth at bedtime. ?  ?gabapentin 300 MG capsule ?Commonly known as: NEURONTIN ?Take 300 mg by mouth 2 (two) times daily. ?  ?HumaLOG Mix 75/25 (75-25) 100 UNIT/ML Susp injection ?Generic drug: insulin lispro protamine-lispro ?Inject into the skin as directed. ?  ?HYDROcodone-acetaminophen 10-325 MG tablet ?Commonly known as: NORCO ?Take 1 tablet by mouth 4 (four) times daily as needed. Home med ?What changed:  ?when to take this ?reasons to take this ?additional instructions ?  ?insulin detemir 100 UNIT/ML injection ?Commonly known as: LEVEMIR ?Inject 15 Units into the skin at bedtime. ?  ?Jardiance 10 MG Tabs tablet ?Generic drug: empagliflozin ?Take 10 mg by mouth daily. ?  ?levETIRAcetam 500 MG tablet ?Commonly known as: KEPPRA ?Take 1 tablet (500 mg total) by mouth 2 (two) times daily. ?  ?meloxicam 15 MG tablet ?Commonly known as: MOBIC ?Take 1 tablet (15 mg total) by mouth daily as needed for pain. Home med. ?What changed:  ?when to take this ?reasons to take this ?additional instructions ?  ?montelukast 10 MG tablet ?Commonly known as: SINGULAIR ?Take 10 mg by mouth daily. ?  ?MULTIVITAMIN ADULTS 50+ PO ?Take 1 tablet by mouth at bedtime. ?  ?Savella 50 MG Tabs tablet ?Generic drug: Milnacipran ?Take 50 mg by mouth 2 (two) times daily. ?  ?topiramate 100 MG tablet ?Commonly known as: TOPAMAX ?Take 1 tablet (100 mg total) by mouth 2 (two) times daily. ?What changed: Another medication with the same name was removed. Continue taking this medication, and follow the directions you see here. ?  ?Travoprost (BAK Free) 0.004 % Soln ophthalmic solution ?Commonly known as: TRAVATAN ?Place 1 drop into the left eye at bedtime. ?   ?traZODone 100 MG tablet ?Commonly known as: DESYREL ?Take 100 mg by mouth at bedtime as needed for sleep. ?  ?trimethoprim-polymyxin b ophthalmic solution ?Commonly known as: POLYTRIM ?Place 1 drop into the left eye 2 (two) times daily. ?  ?Trulance 3 MG Tabs ?Generic drug: Plecanatide ?Take 1 tablet by mouth daily. ?  ?Ventolin HFA 108 (90 Base) MCG/ACT inhaler ?Generic drug: albuterol ?Inhale 1-2 puffs into the lungs every 6 (six) hours as needed for wheezing or shortness of breath. ?  ?Vitamin D3 50 MCG (2000 UT) Tabs ?Take 1 tablet by mouth daily. ?  ? ?  ? ? ? ? ?Allergies  ?Allergen Reactions  ? Bee Venom Anaphylaxis  ? Bee Venom Anaphylaxis  ? Amitriptyline Hives and Rash  ? Nortriptyline Hives  ? Amitriptyline Other (See Comments)  ?  whelps  ? Norco [Hydrocodone-Acetaminophen] Itching and Other (See Comments)  ?  Can also tolerate Percocet, but MUST "pre-medicate" with Benadryl  ? Nortriptyline  Other (See Comments)  ?  whelps  ? Zithromax [Azithromycin] Hives  ? ? ? ?The results of significant diagnostics from this hospitalization (including imaging, microbiology, ancillary and laboratory) are listed below for reference.  ? ?Consultations: ? ? ?Procedures/Studies: ?DG Chest 2 View ? ?Result Date: 05/26/2021 ?CLINICAL DATA:  Chest pain. EXAM: CHEST - 2 VIEW COMPARISON:  May 01, 2021. FINDINGS: The heart size and mediastinal contours are within normal limits. Both lungs are clear. The visualized skeletal structures are unremarkable. IMPRESSION: No active cardiopulmonary disease. Electronically Signed   By: Marijo Conception M.D.   On: 05/26/2021 18:32  ? ?CT HEAD WO CONTRAST (5MM) ? ?Result Date: 06/01/2021 ?CLINICAL DATA:  Headache EXAM: CT HEAD WITHOUT CONTRAST TECHNIQUE: Contiguous axial images were obtained from the base of the skull through the vertex without intravenous contrast. RADIATION DOSE REDUCTION: This exam was performed according to the departmental dose-optimization program which includes  automated exposure control, adjustment of the mA and/or kV according to patient size and/or use of iterative reconstruction technique. COMPARISON:  CT head earlier the same day FINDINGS: Brain: No acute intracranial hemo

## 2021-06-24 ENCOUNTER — Ambulatory Visit: Payer: Medicare Other | Admitting: Family Medicine

## 2021-07-15 ENCOUNTER — Emergency Department: Payer: Medicare Other

## 2021-07-15 ENCOUNTER — Emergency Department
Admission: EM | Admit: 2021-07-15 | Discharge: 2021-07-15 | Disposition: A | Discharge status: Home / Self Care | Payer: Medicare Other | Attending: Emergency Medicine | Admitting: Emergency Medicine

## 2021-07-15 DIAGNOSIS — E119 Type 2 diabetes mellitus without complications: Secondary | ICD-10-CM | POA: Insufficient documentation

## 2021-07-15 DIAGNOSIS — R200 Anesthesia of skin: Secondary | ICD-10-CM | POA: Diagnosis present

## 2021-07-15 DIAGNOSIS — J449 Chronic obstructive pulmonary disease, unspecified: Secondary | ICD-10-CM | POA: Insufficient documentation

## 2021-07-15 DIAGNOSIS — R0789 Other chest pain: Secondary | ICD-10-CM | POA: Insufficient documentation

## 2021-07-15 DIAGNOSIS — G43109 Migraine with aura, not intractable, without status migrainosus: Secondary | ICD-10-CM | POA: Diagnosis not present

## 2021-07-15 DIAGNOSIS — G43009 Migraine without aura, not intractable, without status migrainosus: Secondary | ICD-10-CM

## 2021-07-15 DIAGNOSIS — G43909 Migraine, unspecified, not intractable, without status migrainosus: Secondary | ICD-10-CM | POA: Insufficient documentation

## 2021-07-15 DIAGNOSIS — R202 Paresthesia of skin: Secondary | ICD-10-CM

## 2021-07-15 LAB — COMPREHENSIVE METABOLIC PANEL
ALT: 33 U/L (ref 0–44)
AST: 26 U/L (ref 15–41)
Albumin: 4.3 g/dL (ref 3.5–5.0)
Alkaline Phosphatase: 94 U/L (ref 38–126)
Anion gap: 9 (ref 5–15)
BUN: 9 mg/dL (ref 6–20)
CO2: 27 mmol/L (ref 22–32)
Calcium: 9.7 mg/dL (ref 8.9–10.3)
Chloride: 102 mmol/L (ref 98–111)
Creatinine, Ser: 0.7 mg/dL (ref 0.44–1.00)
GFR, Estimated: >60 mL/min (ref >60)
Glucose, Bld: 361 mg/dL — ABNORMAL HIGH (ref 70–99)
Potassium: 3.7 mmol/L (ref 3.5–5.1)
Sodium: 138 mmol/L (ref 135–145)
Total Bilirubin: 0.4 mg/dL (ref 0.3–1.2)
Total Protein: 7.8 g/dL (ref 6.5–8.1)

## 2021-07-15 LAB — TROPONIN I (HIGH SENSITIVITY)
Troponin I (High Sensitivity): 3 ng/L (ref <18)
Troponin I (High Sensitivity): 3 ng/L (ref <18)

## 2021-07-15 LAB — CBC
HCT: 47 % — ABNORMAL HIGH (ref 36.0–46.0)
Hemoglobin: 14.8 g/dL (ref 12.0–15.0)
MCH: 26 pg (ref 26.0–34.0)
MCHC: 31.5 g/dL (ref 30.0–36.0)
MCV: 82.5 fL (ref 80.0–100.0)
Platelets: 242 10*3/uL (ref 150–400)
RBC: 5.7 MIL/uL — ABNORMAL HIGH (ref 3.87–5.11)
RDW: 13.1 % (ref 11.5–15.5)
WBC: 6.1 10*3/uL (ref 4.0–10.5)
nRBC: 0 % (ref 0.0–0.2)

## 2021-07-15 LAB — DIFFERENTIAL
Abs Immature Granulocytes: 0.04 10*3/uL (ref 0.00–0.07)
Basophils Absolute: 0.1 10*3/uL (ref 0.0–0.1)
Basophils Relative: 1 %
Eosinophils Absolute: 0.1 10*3/uL (ref 0.0–0.5)
Eosinophils Relative: 2 %
Immature Granulocytes: 1 %
Lymphocytes Relative: 30 %
Lymphs Abs: 1.8 10*3/uL (ref 0.7–4.0)
Monocytes Absolute: 0.5 10*3/uL (ref 0.1–1.0)
Monocytes Relative: 8 %
Neutro Abs: 3.6 10*3/uL (ref 1.7–7.7)
Neutrophils Relative %: 58 %

## 2021-07-15 LAB — PROTIME-INR
INR: 0.9 (ref 0.8–1.2)
Prothrombin Time: 12.4 seconds (ref 11.4–15.2)

## 2021-07-15 LAB — ETHANOL: Alcohol, Ethyl (B): <10 mg/dL (ref <10)

## 2021-07-15 LAB — CBG MONITORING, ED: Glucose-Capillary: 329 mg/dL — ABNORMAL HIGH (ref 70–99)

## 2021-07-15 LAB — APTT: aPTT: 29 seconds (ref 24–36)

## 2021-07-15 MED ORDER — SODIUM CHLORIDE 0.9% FLUSH: 3.0000 mL | Freq: Once | INTRAVENOUS | Status: DC

## 2021-07-15 MED ORDER — METOCLOPRAMIDE HCL 5 MG/ML IJ SOLN
10.0000 mg | Freq: Once | INTRAMUSCULAR | Status: AC
  Administered 2021-07-15: 10 mg via INTRAVENOUS
  Filled 2021-07-15: qty 2

## 2021-07-15 MED ORDER — MORPHINE SULFATE (PF) 4 MG/ML IV SOLN
4.0000 mg | Freq: Once | INTRAVENOUS | Status: AC
  Administered 2021-07-15: 4 mg via INTRAVENOUS
  Filled 2021-07-15: qty 1

## 2021-07-15 MED ORDER — DIPHENHYDRAMINE HCL 50 MG/ML IJ SOLN
12.5000 mg | Freq: Once | INTRAMUSCULAR | Status: AC
  Administered 2021-07-15: 12.5 mg via INTRAVENOUS
  Filled 2021-07-15: qty 1

## 2021-07-15 MED ORDER — SODIUM CHLORIDE 0.9 % IV BOLUS
1000.0000 mL | Freq: Once | INTRAVENOUS | Status: AC
  Administered 2021-07-15: 1000 mL via INTRAVENOUS

## 2021-07-15 NOTE — Progress Notes (Signed)
Stroke cart activated at this time, pt already in CT.
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
## 2021-07-15 NOTE — ED Provider Notes (Signed)
Vitals:   07/15/21 1930 07/15/21 2030  BP: 121/67 122/70  Pulse: 75 78  Resp: 18 17  Temp:    SpO2: 97% 96%     ----------------------------------------- 9:44 PM on 07/15/2021 ----------------------------------------- Patient no ongoing headache or symptoms.  Discussed work-up to this point reassuring evaluation.  Normal troponins.  He is currently alert asymptomatic.  Advised patient she may not drive this evening and that she should follow-up with neurology and primary care.  Return precautions and treatment recommendations and follow-up discussed with the patient who is agreeable with the plan.    Sharyn Creamer, MD 07/15/21 2144

## 2021-07-15 NOTE — Progress Notes (Signed)
   07/15/21 1338  Clinical Encounter Type  Visited With Patient not available  Visit Type Code   Chaplain Dajane Valli attended room following code stroke. Pt taken to CT and then MRI. Standing by for further needs; checked in with Althea Charon.

## 2021-07-15 NOTE — ED Notes (Signed)
Called  Code Stroke to [facility], Lauren  [phone]

## 2021-07-15 NOTE — ED Notes (Signed)
Pt c/o central chest pain as well as left sided facial, hand, and leg numbness that started at 1215. Pt states it happened recently and she was kept in the hospital but it resolved. Took pt to CT where neurology met pt. Pt was taken for stat MRI by Jarrett Soho, stroke coordinator and Jenetta Downer, primary RN.

## 2021-07-15 NOTE — ED Triage Notes (Signed)
Pt c/o sudden onset chest heaviness/pressure and left facial numbness that started around 1230pm today while sitting at her desk at work
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
## 2021-07-15 NOTE — Discharge Instructions (Signed)
Please call and schedule follow-up with your primary care doctor as well as neurologist.  You will need to call and make these appointments tomorrow morning.  No driving this evening as you were given medication that can cause you to be sleepy or affect your ability to drive tonight.  You have been seen in the Emergency Department (ED) for a headache and numb feeling.   As we have discussed, please follow up with your primary care doctor and neurology as soon as possible regarding today's Emergency Department (ED) visit and your headache symptoms.    Call your doctor or return to the ED if you have a worsening headache, sudden and severe headache, confusion, slurred speech, facial droop, weakness or numbness in any arm or leg, extreme fatigue, vision problems, or other symptoms that concern you.

## 2021-07-28 NOTE — Progress Notes (Signed)
I am very sorry I cannot access the patient's order so I do not know who ordered the alcohol screening test is possible that one of the consultants asked for this.  I did not actually see the patient otherwise.

## 2021-08-23 ENCOUNTER — Ambulatory Visit (INDEPENDENT_AMBULATORY_CARE_PROVIDER_SITE_OTHER): Payer: Medicare Other | Admitting: Family Medicine

## 2021-08-23 ENCOUNTER — Encounter: Payer: Self-pay | Admitting: Family Medicine

## 2021-08-23 VITALS — BP 120/71 | HR 93 | Ht 63.0 in | Wt 157.0 lb

## 2021-08-23 DIAGNOSIS — G43009 Migraine without aura, not intractable, without status migrainosus: Secondary | ICD-10-CM | POA: Diagnosis not present

## 2021-08-23 DIAGNOSIS — G4733 Obstructive sleep apnea (adult) (pediatric): Secondary | ICD-10-CM | POA: Diagnosis not present

## 2021-08-23 MED ORDER — AJOVY 225 MG/1.5ML ~~LOC~~ SOAJ: 225.0000 mg | SUBCUTANEOUS | 3 refills | Status: DC

## 2021-08-23 MED ORDER — NURTEC 75 MG PO TBDP: 75.0000 mg | ORAL_TABLET | Freq: Every day | ORAL | 11 refills | Status: DC | PRN

## 2021-08-23 MED ORDER — TOPIRAMATE 100 MG PO TABS: 100.0000 mg | ORAL_TABLET | Freq: Two times a day (BID) | ORAL | 3 refills | Status: AC

## 2021-08-23 MED ORDER — LEVETIRACETAM 500 MG PO TABS: 500.0000 mg | ORAL_TABLET | Freq: Two times a day (BID) | ORAL | 3 refills | Status: DC
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
## 2021-08-23 NOTE — Patient Instructions (Signed)
Below is our plan:  We will continue topiramate 100mg  and levetiracetam 500mg  twice daily. We will add Ajvoy injections once monthly. Replace rizatriptan with Nurtec. Take 75mg  daily as needed. No more than than 1 tablet per day or 8 tablets per month.   Please continue working closely with PCP for comorbidity management. I am concerned that your uncontrolled diabetes could be the cause of some of your headaches. Your last A1C was 12.2. Goal is less than 7. Please continue aspirin and Crestor as directed by PCP for stroke prevention.   Please make sure you are staying well hydrated. I recommend 50-60 ounces daily. Well balanced diet and regular exercise encouraged. Consistent sleep schedule with 6-8 hours recommended.   Please continue follow up with care team as directed.   Follow up with me in 4-6 months   You may receive a survey regarding today's visit. I encourage you to leave honest feed back as I do use this information to improve patient care. Thank you for seeing me today!

## 2021-08-24 ENCOUNTER — Telehealth: Payer: Self-pay | Admitting: Neurology

## 2021-08-24 NOTE — Telephone Encounter (Signed)
PA completed for the patient on [facility] rx [id number] Waiting response

## 2021-08-24 NOTE — Telephone Encounter (Signed)
Request Reference Number: [id number]. NURTEC TAB 75MG  ODT is approved through 01/23/2022. Your patient may now fill this prescription and it will be covered.

## 2021-12-10 ENCOUNTER — Ambulatory Visit (INDEPENDENT_AMBULATORY_CARE_PROVIDER_SITE_OTHER): Payer: Medicare Other

## 2021-12-10 ENCOUNTER — Ambulatory Visit (INDEPENDENT_AMBULATORY_CARE_PROVIDER_SITE_OTHER): Payer: Medicare Other | Admitting: Podiatry

## 2021-12-10 ENCOUNTER — Encounter: Payer: Self-pay | Admitting: Podiatry

## 2021-12-10 DIAGNOSIS — R52 Pain, unspecified: Secondary | ICD-10-CM

## 2021-12-10 DIAGNOSIS — M21619 Bunion of unspecified foot: Secondary | ICD-10-CM | POA: Diagnosis not present

## 2021-12-10 DIAGNOSIS — M7671 Peroneal tendinitis, right leg: Secondary | ICD-10-CM

## 2021-12-10 MED ORDER — TRIAMCINOLONE ACETONIDE 10 MG/ML IJ SUSP
10.0000 mg | Freq: Once | INTRAMUSCULAR | Status: AC
  Administered 2021-12-10: 10 mg

## 2021-12-13 NOTE — Progress Notes (Signed)
Subjective:   Patient ID: Annette [facility], female   DOB: 56 y.o.   MRN: [id number]   HPI Patient states that she developed trauma and pain to the outside of her right foot and states she had a very heavy large dog that was sitting on her foot.  Patient states that sharp she is tried ice and soak it.  Also complains about structural bunion deformity and patient does not smoke likes to be active   Review of Systems  All other systems reviewed and are negative.       Objective:  Physical Exam Vitals and nursing note reviewed.  Constitutional:      Appearance: She is well-developed.  Pulmonary:     Effort: Pulmonary effort is normal.  Musculoskeletal:        General: Normal range of motion.  Skin:    General: Skin is warm.  Neurological:     Mental Status: She is alert.     Neurovascular status was found to be intact muscle strength was found to be adequate range of motion adequate at this time except for splinting on the right side.  There is quite a bit of discomfort in the peroneal group wrote proximal to insertion base of fifth metatarsal and also into the peroneal tertius group.  Patient is found to have good digital perfusion well-oriented x3     Assessment:  Inflammatory tendinitis with probable trauma cannot rule out bony injury but most likely soft tissue     Plan:  H&P x-rays reviewed today I went ahead did sterile prep and I injected the lateral side after explaining chances for rupture or other complications with 3 mg Dexasone Kenalog 5 mg Xylocaine applied fascial brace to lift up the lateral side of the foot and take pressure off the peroneal group advised on shoe gear modifications and reappoint to recheck in the next several weeks  X-rays were negative for signs of fracture has large spur formation no other pathology noted

## 2021-12-14 ENCOUNTER — Other Ambulatory Visit: Payer: Self-pay | Admitting: Podiatry

## 2021-12-14 DIAGNOSIS — M21619 Bunion of unspecified foot: Secondary | ICD-10-CM

## 2021-12-14 DIAGNOSIS — M7671 Peroneal tendinitis, right leg: Secondary | ICD-10-CM

## 2022-01-15 ENCOUNTER — Other Ambulatory Visit: Payer: Self-pay

## 2022-01-15 ENCOUNTER — Emergency Department: Payer: Medicare Other

## 2022-01-15 ENCOUNTER — Emergency Department
Admission: EM | Admit: 2022-01-15 | Discharge: 2022-01-15 | Disposition: A | Discharge status: Home / Self Care | Payer: Medicare Other | Attending: Emergency Medicine | Admitting: Emergency Medicine

## 2022-01-15 DIAGNOSIS — Z1152 Encounter for screening for COVID-19: Secondary | ICD-10-CM | POA: Diagnosis not present

## 2022-01-15 DIAGNOSIS — J101 Influenza due to other identified influenza virus with other respiratory manifestations: Secondary | ICD-10-CM | POA: Diagnosis not present

## 2022-01-15 DIAGNOSIS — R739 Hyperglycemia, unspecified: Secondary | ICD-10-CM | POA: Diagnosis not present

## 2022-01-15 DIAGNOSIS — Z794 Long term (current) use of insulin: Secondary | ICD-10-CM | POA: Insufficient documentation

## 2022-01-15 DIAGNOSIS — R0789 Other chest pain: Secondary | ICD-10-CM | POA: Diagnosis present

## 2022-01-15 LAB — HEPATIC FUNCTION PANEL
ALT: 40 U/L (ref 0–44)
AST: 48 U/L — ABNORMAL HIGH (ref 15–41)
Albumin: 4.2 g/dL (ref 3.5–5.0)
Alkaline Phosphatase: 109 U/L (ref 38–126)
Bilirubin, Direct: 0.1 mg/dL (ref 0.0–0.2)
Indirect Bilirubin: 1 mg/dL — ABNORMAL HIGH (ref 0.3–0.9)
Total Bilirubin: 1.1 mg/dL (ref 0.3–1.2)
Total Protein: 7.7 g/dL (ref 6.5–8.1)

## 2022-01-15 LAB — CBC
HCT: 49.4 % — ABNORMAL HIGH (ref 36.0–46.0)
Hemoglobin: 15.6 g/dL — ABNORMAL HIGH (ref 12.0–15.0)
MCH: 26.5 pg (ref 26.0–34.0)
MCHC: 31.6 g/dL (ref 30.0–36.0)
MCV: 84 fL (ref 80.0–100.0)
Platelets: 178 10*3/uL (ref 150–400)
RBC: 5.88 MIL/uL — ABNORMAL HIGH (ref 3.87–5.11)
RDW: 13.2 % (ref 11.5–15.5)
WBC: 6.4 10*3/uL (ref 4.0–10.5)
nRBC: 0 % (ref 0.0–0.2)

## 2022-01-15 LAB — RESP PANEL BY RT-PCR (RSV, FLU A&B, COVID)  RVPGX2
Influenza A by PCR: POSITIVE — AB
Influenza B by PCR: NEGATIVE
Resp Syncytial Virus by PCR: NEGATIVE
SARS Coronavirus 2 by RT PCR: NEGATIVE

## 2022-01-15 LAB — BASIC METABOLIC PANEL
Anion gap: 11 (ref 5–15)
BUN: 10 mg/dL (ref 6–20)
CO2: 20 mmol/L — ABNORMAL LOW (ref 22–32)
Calcium: 8.9 mg/dL (ref 8.9–10.3)
Chloride: 102 mmol/L (ref 98–111)
Creatinine, Ser: 0.85 mg/dL (ref 0.44–1.00)
GFR, Estimated: >60 mL/min (ref >60)
Glucose, Bld: 510 mg/dL (ref 70–99)
Potassium: 4 mmol/L (ref 3.5–5.1)
Sodium: 133 mmol/L — ABNORMAL LOW (ref 135–145)

## 2022-01-15 LAB — TROPONIN I (HIGH SENSITIVITY)
Troponin I (High Sensitivity): 3 ng/L (ref <18)
Troponin I (High Sensitivity): 3 ng/L (ref <18)

## 2022-01-15 LAB — LIPASE, BLOOD: Lipase: 61 U/L — ABNORMAL HIGH (ref 11–51)

## 2022-01-15 LAB — CBG MONITORING, ED: Glucose-Capillary: 368 mg/dL — ABNORMAL HIGH (ref 70–99)

## 2022-01-15 MED ORDER — ONDANSETRON HCL 4 MG/2ML IJ SOLN
4.0000 mg | Freq: Once | INTRAMUSCULAR | Status: AC
  Administered 2022-01-15: 4 mg via INTRAVENOUS
  Filled 2022-01-15: qty 2

## 2022-01-15 MED ORDER — OSELTAMIVIR PHOSPHATE 75 MG PO CAPS: 75.0000 mg | ORAL_CAPSULE | Freq: Two times a day (BID) | ORAL | 0 refills | Status: AC

## 2022-01-15 MED ORDER — KETOROLAC TROMETHAMINE 15 MG/ML IJ SOLN
15.0000 mg | Freq: Once | INTRAMUSCULAR | Status: AC
  Administered 2022-01-15: 15 mg via INTRAVENOUS
  Filled 2022-01-15: qty 1

## 2022-01-15 MED ORDER — ACETAMINOPHEN 325 MG PO TABS
650.0000 mg | ORAL_TABLET | Freq: Once | ORAL | Status: AC | PRN
  Administered 2022-01-15: 650 mg via ORAL
  Filled 2022-01-15: qty 2

## 2022-01-15 MED ORDER — ONDANSETRON 4 MG PO TBDP: 4.0000 mg | ORAL_TABLET | Freq: Three times a day (TID) | ORAL | 0 refills | Status: AC | PRN

## 2022-01-15 MED ORDER — BENZONATATE 100 MG PO CAPS: 100.0000 mg | ORAL_CAPSULE | Freq: Three times a day (TID) | ORAL | 0 refills | Status: AC | PRN

## 2022-01-15 MED ORDER — LACTATED RINGERS IV BOLUS
1000.0000 mL | Freq: Once | INTRAVENOUS | Status: AC
  Administered 2022-01-15: 1000 mL via INTRAVENOUS

## 2022-01-15 MED ORDER — SODIUM CHLORIDE 0.9 % IV BOLUS: 1000.0000 mL | Freq: Once | INTRAVENOUS | Status: DC

## 2022-01-15 MED ORDER — INSULIN ASPART 100 UNIT/ML IJ SOLN
7.0000 [IU] | Freq: Once | INTRAMUSCULAR | Status: AC
  Administered 2022-01-15: 7 [IU] via INTRAVENOUS
  Filled 2022-01-15: qty 1

## 2022-01-15 NOTE — ED Triage Notes (Signed)
Pt to ED via POV from home. Pt reports cough x1 week that continues to get worse and left sided CP, nausea and SOB x2 days. Pt denies blood thinners. Pt reports coworkers have been sick

## 2022-01-15 NOTE — ED Provider Notes (Addendum)
[facility] Provider Note    Event Date/Time   First MD Initiated Contact with Patient 01/15/22 1114     (approximate)   History   Chest Pain and Shortness of Breath   HPI  Annette Norman is a 56 y.o. female who comes in from home for cough for a week with chest discomfort nausea and shortness of breath.  Patient is not on any blood thinner.  Does report a positive sick contact.  Patient noted to be febrile in triage.  Patient reports that she initially had a mild cough for a little over a week but really started feeling bad yesterday.  She reports fevers, coughing.  Reports taking her insulin but sugars have been running high in the 300s.  She denies any falls or hitting her head.  Does have some associated nausea.  Denies any history of blood clots.   Physical Exam   Triage Vital Signs: ED Triage Vitals  Enc Vitals Group     BP 01/15/22 0831 (!) 147/84     Pulse Rate 01/15/22 0831 (!) 116     Resp 01/15/22 0831 18     Temp 01/15/22 0831 (!) 102.2 F (39 C)     Temp Source 01/15/22 0831 Oral     SpO2 01/15/22 0831 97 %     Weight --      Height 01/15/22 0832 5\' 3"  (1.6 m)     Head Circumference --      Peak Flow --      Pain Score 01/15/22 0832 5     Pain Loc --      Pain Edu? --      Excl. in GC? --     Most recent vital signs: Vitals:   01/15/22 0831  BP: (!) 147/84  Pulse: (!) 116  Resp: 18  Temp: (!) 102.2 F (39 C)  SpO2: 97%     General: Awake, no distress.  CV:  Good peripheral perfusion.  Resp:  Normal effort.  Clear lungs Abd:  No distention.  Soft and nontender Other:  No swelling in legs.  No calf tenderness. 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  Cranial nerves II through XII are intact.  Equal strength in arms and legs other then left eye prosthetic.   ED Results / Procedures / Treatments   Labs (all labs ordered are listed, but only abnormal results are displayed) Labs Reviewed  RESP PANEL  BY RT-PCR (RSV, FLU A&B, COVID)  RVPGX2 - Abnormal; Notable for the following components:      Result Value   Influenza A by PCR POSITIVE (*)    All other components within normal limits  BASIC METABOLIC PANEL - Abnormal; Notable for the following components:   Sodium 133 (*)    CO2 20 (*)    Glucose, Bld 510 (*)    All other components within normal limits  CBC - Abnormal; Notable for the following components:   RBC 5.88 (*)    Hemoglobin 15.6 (*)    HCT 49.4 (*)    All other components within normal limits  TROPONIN I (HIGH SENSITIVITY)  TROPONIN I (HIGH SENSITIVITY)     EKG  My interpretation of EKG:  Sinus tachycardia rate of 119 without any ST elevation or T wave inversions, normal intervals  RADIOLOGY I have reviewed the xray personally and interpreted without evidence of any pneumonia  PROCEDURES:  Critical Care performed: No  Procedures  MEDICATIONS ORDERED IN ED: Medications  acetaminophen (TYLENOL) tablet 650 mg (650 mg Oral Given 01/15/22 0839)     IMPRESSION / MDM / ASSESSMENT AND PLAN / ED COURSE  I reviewed the triage vital signs and the nursing notes.   Patient's presentation is most consistent with acute presentation with potential threat to life or bodily function.   Patient comes in with febrile tachycardic.  Patient given some Tylenol in triage.  Labs ordered evaluate for COVID, flu, pneumonia, DKA.  BMP shows elevated sugar of 510 but anion gap is normal.  Slightly low bicarb but denies any history of DKA.  Doubt that this represents DKA given normal anion gap but will treat with some IV insulin and fluids to help treat her hyperglycemia.  CBC shows elevated hemoglobin.  Troponin is negative.  Patient's flu test is positive.  Patient be given some fluid IVs insulin, Toradol for some generalized muscle aches.  Will get repeat troponin to make sure no signs of ACS.  Patient's sugar is downtrending to 368 which is closer to her baseline.  Her  troponins are negative x 2.  We discussed that the flu treatment and how it has limited effect if symptoms been going on for greater than 40 hours but given she reports an acute change over the past 48 hours she is requesting the Tamiflu.  She understands that it can only decrease symptom onset and has some side effects but she would like to proceed.  Will also send her home with some nausea pills,, Zofran and Tessalon Perles for cough.  Patient's heart rates are slightly elevated but much improved from prior and she still getting IV fluids prior to discharge.   The patient is on the cardiac monitor to evaluate for evidence of arrhythmia and/or significant heart rate changes.    FINAL CLINICAL IMPRESSION(S) / ED DIAGNOSES   Final diagnoses:  Influenza A  Hyperglycemia     Rx / DC Orders   ED Discharge Orders          Ordered    oseltamivir (TAMIFLU) 75 MG capsule  2 times daily        01/15/22 1349    benzonatate (TESSALON PERLES) 100 MG capsule  3 times daily PRN        01/15/22 1349    ondansetron (ZOFRAN-ODT) 4 MG disintegrating tablet  Every 8 hours PRN        01/15/22 1349             Note:  This document was prepared using Dragon voice recognition software and may include unintentional dictation errors.   Concha Se, MD 01/15/22 1349    Concha Se, MD 01/15/22 1351

## 2022-01-15 NOTE — Discharge Instructions (Signed)
We are treating you for the flu with Tamiflu.  Zofran to help with nausea and Tessalon Perles to help with cough.  Your sugars were very elevated today it is important you take your sugars if your sugars continue to climb you develop symptoms such as severe nausea and vomiting should return to the ER for repeat evaluation.

## 2022-01-26 ENCOUNTER — Ambulatory Visit: Payer: Medicare Other | Admitting: Family Medicine

## 2022-01-26 NOTE — Patient Instructions (Incomplete)
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
## 2022-01-28 ENCOUNTER — Telehealth: Payer: Self-pay | Admitting: *Deleted

## 2022-01-28 NOTE — Telephone Encounter (Signed)
Received [place] Nurtec request on covermymeds. Key: [id number]. Tried submitting but received the following response: "This medication or product was previously approved on [id number] from 2022-01-24 to 2023-01-24. **Please note: This request was submitted electronically. Formulary lowering, tiering exception, cost reduction and/or pre-benefit determination review (including prospective Medicare hospice reviews) requests cannot be requested using this method of submission. Providers contact us at [phone] for further assistance."

## 2022-02-01 ENCOUNTER — Emergency Department: Payer: 59

## 2022-02-01 ENCOUNTER — Other Ambulatory Visit: Payer: Self-pay

## 2022-02-01 ENCOUNTER — Emergency Department
Admission: EM | Admit: 2022-02-01 | Discharge: 2022-02-01 | Disposition: A | Discharge status: Home / Self Care | Payer: 59 | Attending: Emergency Medicine | Admitting: Emergency Medicine

## 2022-02-01 DIAGNOSIS — Z794 Long term (current) use of insulin: Secondary | ICD-10-CM | POA: Diagnosis not present

## 2022-02-01 DIAGNOSIS — R1013 Epigastric pain: Secondary | ICD-10-CM | POA: Insufficient documentation

## 2022-02-01 DIAGNOSIS — F17200 Nicotine dependence, unspecified, uncomplicated: Secondary | ICD-10-CM | POA: Insufficient documentation

## 2022-02-01 DIAGNOSIS — R739 Hyperglycemia, unspecified: Secondary | ICD-10-CM

## 2022-02-01 DIAGNOSIS — J441 Chronic obstructive pulmonary disease with (acute) exacerbation: Secondary | ICD-10-CM | POA: Diagnosis not present

## 2022-02-01 DIAGNOSIS — R0600 Dyspnea, unspecified: Secondary | ICD-10-CM | POA: Diagnosis not present

## 2022-02-01 DIAGNOSIS — R0789 Other chest pain: Secondary | ICD-10-CM | POA: Diagnosis not present

## 2022-02-01 DIAGNOSIS — Z8673 Personal history of transient ischemic attack (TIA), and cerebral infarction without residual deficits: Secondary | ICD-10-CM | POA: Insufficient documentation

## 2022-02-01 DIAGNOSIS — Z7951 Long term (current) use of inhaled steroids: Secondary | ICD-10-CM | POA: Insufficient documentation

## 2022-02-01 DIAGNOSIS — E119 Type 2 diabetes mellitus without complications: Secondary | ICD-10-CM | POA: Insufficient documentation

## 2022-02-01 DIAGNOSIS — R059 Cough, unspecified: Secondary | ICD-10-CM | POA: Insufficient documentation

## 2022-02-01 DIAGNOSIS — Z1152 Encounter for screening for COVID-19: Secondary | ICD-10-CM | POA: Diagnosis not present

## 2022-02-01 LAB — BASIC METABOLIC PANEL
Anion gap: 11 (ref 5–15)
BUN: 13 mg/dL (ref 6–20)
CO2: 28 mmol/L (ref 22–32)
Calcium: 8.9 mg/dL (ref 8.9–10.3)
Chloride: 95 mmol/L — ABNORMAL LOW (ref 98–111)
Creatinine, Ser: 0.73 mg/dL (ref 0.44–1.00)
GFR, Estimated: >60 mL/min (ref >60)
Glucose, Bld: 424 mg/dL — ABNORMAL HIGH (ref 70–99)
Potassium: 3.6 mmol/L (ref 3.5–5.1)
Sodium: 134 mmol/L — ABNORMAL LOW (ref 135–145)

## 2022-02-01 LAB — HEPATIC FUNCTION PANEL
ALT: 30 U/L (ref 0–44)
AST: 30 U/L (ref 15–41)
Albumin: 3.9 g/dL (ref 3.5–5.0)
Alkaline Phosphatase: 105 U/L (ref 38–126)
Bilirubin, Direct: <0.1 mg/dL (ref 0.0–0.2)
Total Bilirubin: 0.7 mg/dL (ref 0.3–1.2)
Total Protein: 6.9 g/dL (ref 6.5–8.1)

## 2022-02-01 LAB — CBC
HCT: 49.1 % — ABNORMAL HIGH (ref 36.0–46.0)
Hemoglobin: 15.9 g/dL — ABNORMAL HIGH (ref 12.0–15.0)
MCH: 26.6 pg (ref 26.0–34.0)
MCHC: 32.4 g/dL (ref 30.0–36.0)
MCV: 82.1 fL (ref 80.0–100.0)
Platelets: 277 10*3/uL (ref 150–400)
RBC: 5.98 MIL/uL — ABNORMAL HIGH (ref 3.87–5.11)
RDW: 13 % (ref 11.5–15.5)
WBC: 6.4 10*3/uL (ref 4.0–10.5)
nRBC: 0 % (ref 0.0–0.2)

## 2022-02-01 LAB — TROPONIN I (HIGH SENSITIVITY)
Troponin I (High Sensitivity): 4 ng/L (ref <18)
Troponin I (High Sensitivity): 3 ng/L (ref <18)

## 2022-02-01 LAB — CBG MONITORING, ED
Glucose-Capillary: 459 mg/dL — ABNORMAL HIGH (ref 70–99)
Glucose-Capillary: 455 mg/dL — ABNORMAL HIGH (ref 70–99)
Glucose-Capillary: 422 mg/dL — ABNORMAL HIGH (ref 70–99)

## 2022-02-01 LAB — LIPASE, BLOOD: Lipase: 80 U/L — ABNORMAL HIGH (ref 11–51)

## 2022-02-01 LAB — RESP PANEL BY RT-PCR (RSV, FLU A&B, COVID)  RVPGX2
Influenza A by PCR: NEGATIVE
Influenza B by PCR: NEGATIVE
Resp Syncytial Virus by PCR: NEGATIVE
SARS Coronavirus 2 by RT PCR: NEGATIVE

## 2022-02-01 MED ORDER — IOHEXOL 350 MG/ML SOLN
75.0000 mL | Freq: Once | INTRAVENOUS | Status: AC | PRN
  Administered 2022-02-01: 100 mL via INTRAVENOUS

## 2022-02-01 MED ORDER — LIDOCAINE VISCOUS HCL 2 % MT SOLN
15.0000 mL | Freq: Once | OROMUCOSAL | Status: AC
  Administered 2022-02-01: 15 mL via ORAL
  Filled 2022-02-01: qty 15

## 2022-02-01 MED ORDER — SODIUM CHLORIDE 0.9 % IV BOLUS
1000.0000 mL | Freq: Once | INTRAVENOUS | Status: AC
  Administered 2022-02-01: 1000 mL via INTRAVENOUS

## 2022-02-01 MED ORDER — ALBUTEROL SULFATE (2.5 MG/3ML) 0.083% IN NEBU
3.0000 mL | INHALATION_SOLUTION | Freq: Once | RESPIRATORY_TRACT | Status: AC
  Administered 2022-02-01: 3 mL via RESPIRATORY_TRACT
  Filled 2022-02-01: qty 3

## 2022-02-01 MED ORDER — ACETAMINOPHEN 500 MG PO TABS
1000.0000 mg | ORAL_TABLET | Freq: Once | ORAL | Status: AC
  Administered 2022-02-01: 1000 mg via ORAL
  Filled 2022-02-01: qty 2

## 2022-02-01 MED ORDER — ALUM & MAG HYDROXIDE-SIMETH 200-200-20 MG/5ML PO SUSP
30.0000 mL | Freq: Once | ORAL | Status: AC
  Administered 2022-02-01: 30 mL via ORAL
  Filled 2022-02-01: qty 30

## 2022-02-01 MED ORDER — KETOROLAC TROMETHAMINE 15 MG/ML IJ SOLN
15.0000 mg | Freq: Once | INTRAMUSCULAR | Status: AC
  Administered 2022-02-01: 15 mg via INTRAVENOUS
  Filled 2022-02-01: qty 1

## 2022-02-01 MED ORDER — FAMOTIDINE 20 MG PO TABS
20.0000 mg | ORAL_TABLET | Freq: Once | ORAL | Status: AC
  Administered 2022-02-01: 20 mg via ORAL
  Filled 2022-02-01: qty 1

## 2022-02-01 MED ORDER — INSULIN ASPART 100 UNIT/ML IJ SOLN
10.0000 [IU] | Freq: Once | INTRAMUSCULAR | Status: AC
  Administered 2022-02-01: 10 [IU] via SUBCUTANEOUS
  Filled 2022-02-01: qty 1

## 2022-02-01 MED ORDER — IBUPROFEN 600 MG PO TABS
600.0000 mg | ORAL_TABLET | Freq: Once | ORAL | Status: AC
  Administered 2022-02-01: 600 mg via ORAL
  Filled 2022-02-01: qty 1

## 2022-02-01 MED ORDER — INSULIN ASPART 100 UNIT/ML IJ SOLN
5.0000 [IU] | Freq: Once | INTRAMUSCULAR | Status: AC
  Administered 2022-02-01: 5 [IU] via SUBCUTANEOUS
  Filled 2022-02-01: qty 1

## 2022-02-01 NOTE — ED Provider Notes (Addendum)
[facility] Provider Note    Event Date/Time   First MD Initiated Contact with Patient 02/01/22 1317     (approximate)   History   Chest Pain and Shortness of Breath   HPI  Annette Norman is a 57 y.o. female   Past medical history of history of prior stroke, COPD, diabetes fibromyalgia who presents emergency department with chest pain and shortness of breath worsening over the last 1 week after she was diagnosed with influenza and chest pain starting today.  Nonexertional nonradiating central chest pain described as a tightness sensation.  Mild cough since influenza no sputum production no fever.    She denies history of blood clots, denies leg pain or swelling.    She was told her COPD is mild and from secondhand smoke.  History was obtained via patient.  Husband is a independent historian available at bedside is corroborating information.      Physical Exam   Triage Vital Signs: ED Triage Vitals  Enc Vitals Group     BP 02/01/22 1024 (!) 141/85     Pulse Rate 02/01/22 1024 95     Resp 02/01/22 1024 17     Temp 02/01/22 1024 98.1 F (36.7 C)     Temp Source 02/01/22 1024 Oral     SpO2 02/01/22 1024 97 %     Weight --      Height --      Head Circumference --      Peak Flow --      Pain Score 02/01/22 1022 10     Pain Loc --      Pain Edu? --      Excl. in [place]? --     Most recent vital signs: Vitals:   02/01/22 1024 02/01/22 1321  BP: (!) 141/85 123/75  Pulse: 95 92  Resp: 17 18  Temp: 98.1 F (36.7 C) 98.1 F (36.7 C)  SpO2: 97% 99%    General: Awake, no distress.  CV:  Good peripheral perfusion.  Resp:  Normal effort.  Abd:  No distention.  Other:  Wake alert oriented nontoxic comfortable, lungs clear to auscultation without obvious focalities or wheezing.  Abdomen soft and tender mildly to epigastrum.  Skin appears warm well-perfused he is speaking full sentences no respiratory distress hemodynamics appropriate and  reassuring no fever and no hypoxemia.  No leg swelling or pain.   ED Results / Procedures / Treatments   Labs (all labs ordered are listed, but only abnormal results are displayed) Labs Reviewed  BASIC METABOLIC PANEL - Abnormal; Notable for the following components:      Result Value   Sodium 134 (*)    Chloride 95 (*)    Glucose, Bld 424 (*)    All other components within normal limits  CBC - Abnormal; Notable for the following components:   RBC 5.98 (*)    Hemoglobin 15.9 (*)    HCT 49.1 (*)    All other components within normal limits  RESP PANEL BY RT-PCR (RSV, FLU A&B, COVID)  RVPGX2  HEPATIC FUNCTION PANEL  LIPASE, BLOOD  POC URINE PREG, ED  TROPONIN I (HIGH SENSITIVITY)  TROPONIN I (HIGH SENSITIVITY)     I reviewed labs and they are notable for glucose is elevated in the 400s but anion gap is normal.  Troponin is 4.  EKG  ED ECG REPORT I, Pilar Jarvis, the attending physician, personally viewed and interpreted this ECG.   Date: 02/01/2022  EKG Time: 1021  Rate: 99  Rhythm: normal sinus rhythm  Axis: nl  Intervals:none  ST&T Change: No acute ischemic changes    RADIOLOGY I independently reviewed and interpreted chest x-ray see no obvious focalities or pneumothorax   PROCEDURES:  Critical Care performed: No  Procedures   MEDICATIONS ORDERED IN ED: Medications  albuterol (PROVENTIL) (2.5 MG/3ML) 0.083% nebulizer solution 3 mL (3 mLs Nebulization Given 02/01/22 1345)  acetaminophen (TYLENOL) tablet 1,000 mg (1,000 mg Oral Given 02/01/22 1345)  ibuprofen (ADVIL) tablet 600 mg (600 mg Oral Given 02/01/22 1345)     IMPRESSION / MDM / ASSESSMENT AND PLAN / ED COURSE  I reviewed the triage vital signs and the nursing notes.                              Differential diagnosis includes, but is not limited to, ACS, PE, dissection, viral URI, bacterial pneumonia, COPD exacerbation gastritis, pancreatitis, biliary pathology   MDM: This is a patient with mild  COPD influenza recently with chest pain shortness of breath, may be due to her influenza infection but will check EKG and troponins to rule out ACS.  Considered PE, chest tightness and exertional dyspnea will evaluate CT angiogram.  Fortunately chest x-ray shows no signs of bacterial pneumonia.  Hemodynamics otherwise appropriate reassuring she appears well, labs within normal limits.  Disposition is pending CT angiogram and repeat troponin.  Will trial albuterol inhaler in case there is a COPD exacerbation component, avoid steroids as mild exacerbation and poorly controlled diabetic.  Trial acid medicines GI cocktail   Patient's presentation is most consistent with acute presentation with potential threat to life or bodily function.       FINAL CLINICAL IMPRESSION(S) / ED DIAGNOSES   Final diagnoses:  Atypical chest pain  Dyspnea, unspecified type     Rx / DC Orders   ED Discharge Orders     None        Note:  This document was prepared using Dragon voice recognition software and may include unintentional dictation errors.    Lucillie Garfinkel, MD 02/01/22 1425    Lucillie Garfinkel, MD 02/01/22 [phone]

## 2022-02-01 NOTE — ED Provider Notes (Addendum)
Care assumed of patient from outgoing provider.  See their note for initial history, exam and plan.  Clinical Course as of 02/01/22 1515  Tue Feb 01, 2022  1514 Patient presents to the emergency department with chest pain.  Initial troponin negative.  Currently waiting for CT angiography to evaluate for possible pulmonary embolism.  Plan to discharge home with outpatient follow-up if workup is reassuring. [SM]    Clinical Course User Index [SM] Nathaniel Man, MD   CT angiography with no signs of pulmonary embolism or pneumonia.  Clinical picture concerning for possible pleurisy.  Hyperglycemia.  Patient was given insulin.  Repeat glucose was downtrending, but continues to be elevated.  Patient states that she just wants to use more insulin and go home.  Does not want a wait for recheck.  States that she has plenty of insulin and ability to check her glucose at home..  Discussed NSAIDs and Tylenol for pain control given return precautions for return of chest pain or worsening symptoms.  Discussed follow-up with her primary care physician for further workup for coronary artery disease.   Nathaniel Man, MD 02/01/22 6546    Nathaniel Man, MD 02/01/22 [phone]

## 2022-02-01 NOTE — ED Triage Notes (Signed)
Pt presents to ED with c/o of CP and SOB that started this morning while at work. Pt states she was siting down when the pain started. Pt states pain L chest and non radiating. Pt does endorse HX of COPD.

## 2022-04-25 ENCOUNTER — Other Ambulatory Visit: Payer: Self-pay

## 2022-04-25 ENCOUNTER — Emergency Department
Admission: EM | Admit: 2022-04-25 | Discharge: 2022-04-25 | Disposition: A | Discharge status: Home / Self Care | Payer: 59 | Attending: Emergency Medicine | Admitting: Emergency Medicine

## 2022-04-25 DIAGNOSIS — R000 Tachycardia, unspecified: Secondary | ICD-10-CM | POA: Insufficient documentation

## 2022-04-25 DIAGNOSIS — R112 Nausea with vomiting, unspecified: Secondary | ICD-10-CM | POA: Diagnosis present

## 2022-04-25 DIAGNOSIS — R1010 Upper abdominal pain, unspecified: Secondary | ICD-10-CM | POA: Insufficient documentation

## 2022-04-25 DIAGNOSIS — J449 Chronic obstructive pulmonary disease, unspecified: Secondary | ICD-10-CM | POA: Diagnosis not present

## 2022-04-25 DIAGNOSIS — E119 Type 2 diabetes mellitus without complications: Secondary | ICD-10-CM | POA: Insufficient documentation

## 2022-04-25 LAB — URINALYSIS, COMPLETE (UACMP) WITH MICROSCOPIC
Bilirubin Urine: NEGATIVE
Glucose, UA: >=500 mg/dL — AB
Hgb urine dipstick: NEGATIVE
Ketones, ur: 5 mg/dL — AB
Leukocytes,Ua: NEGATIVE
Nitrite: NEGATIVE
Protein, ur: 100 mg/dL — AB
Specific Gravity, Urine: 1.024 (ref 1.005–1.030)
pH: 5 (ref 5.0–8.0)

## 2022-04-25 LAB — CBC WITH DIFFERENTIAL/PLATELET
Abs Immature Granulocytes: 0.05 10*3/uL (ref 0.00–0.07)
Basophils Absolute: 0.1 10*3/uL (ref 0.0–0.1)
Basophils Relative: 1 %
Eosinophils Absolute: 0.1 10*3/uL (ref 0.0–0.5)
Eosinophils Relative: 1 %
HCT: 46.8 % — ABNORMAL HIGH (ref 36.0–46.0)
Hemoglobin: 15 g/dL (ref 12.0–15.0)
Immature Granulocytes: 1 %
Lymphocytes Relative: 26 %
Lymphs Abs: 1.7 10*3/uL (ref 0.7–4.0)
MCH: 26.7 pg (ref 26.0–34.0)
MCHC: 32.1 g/dL (ref 30.0–36.0)
MCV: 83.3 fL (ref 80.0–100.0)
Monocytes Absolute: 0.6 10*3/uL (ref 0.1–1.0)
Monocytes Relative: 9 %
Neutro Abs: 4 10*3/uL (ref 1.7–7.7)
Neutrophils Relative %: 62 %
Platelets: 237 10*3/uL (ref 150–400)
RBC: 5.62 MIL/uL — ABNORMAL HIGH (ref 3.87–5.11)
RDW: 12.9 % (ref 11.5–15.5)
WBC: 6.4 10*3/uL (ref 4.0–10.5)
nRBC: 0 % (ref 0.0–0.2)

## 2022-04-25 LAB — COMPREHENSIVE METABOLIC PANEL
ALT: 29 U/L (ref 0–44)
AST: 25 U/L (ref 15–41)
Albumin: 4.2 g/dL (ref 3.5–5.0)
Alkaline Phosphatase: 105 U/L (ref 38–126)
Anion gap: 11 (ref 5–15)
BUN: 15 mg/dL (ref 6–20)
CO2: 25 mmol/L (ref 22–32)
Calcium: 9.2 mg/dL (ref 8.9–10.3)
Chloride: 105 mmol/L (ref 98–111)
Creatinine, Ser: 0.7 mg/dL (ref 0.44–1.00)
GFR, Estimated: >60 mL/min (ref >60)
Glucose, Bld: 197 mg/dL — ABNORMAL HIGH (ref 70–99)
Potassium: 4 mmol/L (ref 3.5–5.1)
Sodium: 141 mmol/L (ref 135–145)
Total Bilirubin: 0.7 mg/dL (ref 0.3–1.2)
Total Protein: 7.3 g/dL (ref 6.5–8.1)

## 2022-04-25 LAB — LIPASE, BLOOD: Lipase: 50 U/L (ref 11–51)

## 2022-04-25 MED ORDER — ONDANSETRON 4 MG PO TBDP: 4.0000 mg | ORAL_TABLET | Freq: Three times a day (TID) | ORAL | 0 refills | Status: AC | PRN

## 2022-04-25 MED ORDER — ONDANSETRON HCL 4 MG/2ML IJ SOLN
4.0000 mg | Freq: Once | INTRAMUSCULAR | Status: AC
  Administered 2022-04-25: 4 mg via INTRAVENOUS
  Filled 2022-04-25: qty 2

## 2022-04-25 MED ORDER — SODIUM CHLORIDE 0.9 % IV BOLUS
1000.0000 mL | Freq: Once | INTRAVENOUS | Status: AC
  Administered 2022-04-25: 1000 mL via INTRAVENOUS

## 2022-04-25 NOTE — ED Notes (Signed)
See triage note, pt reports emesis since 0200 with generalized abd pain

## 2022-04-25 NOTE — Discharge Instructions (Addendum)
You are seen in the emergency department for nausea and vomiting.  Your lab work did not show any electrolyte abnormalities.  You had no signs of urinary tract infection.  You were given IV fluids and nausea medications.  Stay hydrated and drink plenty of fluids.  Return to the emergency department if you have worsening symptoms.  zofran (ondansetron) - nausea medication, take 1 tablet every 8 hours as needed for nausea/vomiting.

## 2022-04-25 NOTE — ED Triage Notes (Signed)
Pt sts that she has been vomiting all night and then her stomach started hurting. Denies diarrhea. Pt appears in no obvious distress.
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
## 2022-09-27 ENCOUNTER — Emergency Department
Admission: EM | Admit: 2022-09-27 | Discharge: 2022-09-28 | Disposition: A | Discharge status: Home / Self Care | Payer: 59 | Attending: Emergency Medicine | Admitting: Emergency Medicine

## 2022-09-27 ENCOUNTER — Emergency Department: Payer: 59

## 2022-09-27 ENCOUNTER — Other Ambulatory Visit: Payer: Self-pay

## 2022-09-27 DIAGNOSIS — E119 Type 2 diabetes mellitus without complications: Secondary | ICD-10-CM | POA: Insufficient documentation

## 2022-09-27 DIAGNOSIS — M545 Low back pain, unspecified: Secondary | ICD-10-CM | POA: Diagnosis not present

## 2022-09-27 DIAGNOSIS — B029 Zoster without complications: Secondary | ICD-10-CM | POA: Diagnosis not present

## 2022-09-27 DIAGNOSIS — J449 Chronic obstructive pulmonary disease, unspecified: Secondary | ICD-10-CM | POA: Diagnosis not present

## 2022-09-27 DIAGNOSIS — R079 Chest pain, unspecified: Secondary | ICD-10-CM | POA: Insufficient documentation

## 2022-09-27 DIAGNOSIS — I10 Essential (primary) hypertension: Secondary | ICD-10-CM | POA: Insufficient documentation

## 2022-09-27 DIAGNOSIS — G459 Transient cerebral ischemic attack, unspecified: Secondary | ICD-10-CM | POA: Insufficient documentation

## 2022-09-27 LAB — BASIC METABOLIC PANEL
Anion gap: 9 (ref 5–15)
BUN: 14 mg/dL (ref 6–20)
CO2: 27 mmol/L (ref 22–32)
Calcium: 9.3 mg/dL (ref 8.9–10.3)
Chloride: 103 mmol/L (ref 98–111)
Creatinine, Ser: 0.78 mg/dL (ref 0.44–1.00)
GFR, Estimated: >60 mL/min (ref >60)
Glucose, Bld: 202 mg/dL — ABNORMAL HIGH (ref 70–99)
Potassium: 3.8 mmol/L (ref 3.5–5.1)
Sodium: 139 mmol/L (ref 135–145)

## 2022-09-27 LAB — HEPATIC FUNCTION PANEL
ALT: 21 U/L (ref 0–44)
AST: 20 U/L (ref 15–41)
Albumin: 4.3 g/dL (ref 3.5–5.0)
Alkaline Phosphatase: 85 U/L (ref 38–126)
Bilirubin, Direct: 0.1 mg/dL (ref 0.0–0.2)
Indirect Bilirubin: 0.5 mg/dL (ref 0.3–0.9)
Total Bilirubin: 0.6 mg/dL (ref 0.3–1.2)
Total Protein: 7.9 g/dL (ref 6.5–8.1)

## 2022-09-27 LAB — CBC
HCT: 46.5 % — ABNORMAL HIGH (ref 36.0–46.0)
Hemoglobin: 14.8 g/dL (ref 12.0–15.0)
MCH: 26.9 pg (ref 26.0–34.0)
MCHC: 31.8 g/dL (ref 30.0–36.0)
MCV: 84.5 fL (ref 80.0–100.0)
Platelets: 253 10*3/uL (ref 150–400)
RBC: 5.5 MIL/uL — ABNORMAL HIGH (ref 3.87–5.11)
RDW: 12.9 % (ref 11.5–15.5)
WBC: 6.1 10*3/uL (ref 4.0–10.5)
nRBC: 0 % (ref 0.0–0.2)

## 2022-09-27 LAB — PROTIME-INR
INR: 0.9 (ref 0.8–1.2)
Prothrombin Time: 12.6 s (ref 11.4–15.2)

## 2022-09-27 LAB — LIPASE, BLOOD: Lipase: 60 U/L — ABNORMAL HIGH (ref 11–51)

## 2022-09-27 LAB — TROPONIN I (HIGH SENSITIVITY)
Troponin I (High Sensitivity): 4 ng/L (ref <18)
Troponin I (High Sensitivity): 4 ng/L (ref <18)

## 2022-09-27 MED ORDER — LORAZEPAM 1 MG PO TABS
1.0000 mg | ORAL_TABLET | ORAL | Status: AC
  Administered 2022-09-27: 1 mg via ORAL
  Filled 2022-09-27: qty 1

## 2022-09-27 MED ORDER — ACETAMINOPHEN 500 MG PO TABS
1000.0000 mg | ORAL_TABLET | Freq: Once | ORAL | Status: AC
  Administered 2022-09-27: 1000 mg via ORAL
  Filled 2022-09-27: qty 2

## 2022-09-27 MED ORDER — LIDOCAINE 4 % EX CREA
TOPICAL_CREAM | Freq: Once | CUTANEOUS | Status: AC
  Filled 2022-09-27: qty 5

## 2022-09-27 MED ORDER — VALACYCLOVIR HCL 500 MG PO TABS
1000.0000 mg | ORAL_TABLET | Freq: Once | ORAL | Status: AC
  Administered 2022-09-27: 1000 mg via ORAL
  Filled 2022-09-27: qty 2
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
## 2022-09-27 NOTE — ED Provider Notes (Signed)
[facility] Emergency Department Provider Note     Event Date/Time   First MD Initiated Contact with Patient 09/27/22 1846     (approximate)   History   multiple complaints and Chest Pain   HPI  Annette Norman is a 57 y.o. right-handed female with a history of HLD, HTN, type 2 diabetes, COPD, fibromyalgia, OSA, and stroke, presents to the ED with several complaints.  Patient reports chest pain as well as left-sided pain in the lower back. She would endorse onset last week of achyness, from the fingers to the shoulder.  She also endorses some right arm heaviness.  She reports that she woke this morning at 5 AM with the heaviness of the right arm.  She was last known well when she went to sleep last night at 9:30 PM.  She denies any dizziness, weakness, syncope, distal paresthesias, or shortness of breath.   Physical Exam   Triage Vital Signs: ED Triage Vitals  Encounter Vitals Group     BP 09/27/22 1710 (!) 150/80     Systolic BP Percentile --      Diastolic BP Percentile --      Pulse Rate 09/27/22 1710 96     Resp 09/27/22 1710 15     Temp 09/27/22 1710 98.2 F (36.8 C)     Temp Source 09/27/22 1710 Oral     SpO2 09/27/22 1710 97 %     Weight 09/27/22 1709 163 lb (73.9 kg)     Height 09/27/22 1709 5\' 3"  (1.6 m)     Head Circumference --      Peak Flow --      Pain Score 09/27/22 1708 8     Pain Loc --      Pain Education --      Exclude from Growth Chart --     Most recent vital signs: Vitals:   09/27/22 2350 09/28/22 0223  BP: 112/88 113/84  Pulse: 91 100  Resp: 17 18  Temp: 98.4 F (36.9 C)   SpO2: 98% 100%    General Awake, no distress. NAD HEENT NCAT. PERRL. EOMI. No rhinorrhea. Mucous membranes are moist.  Number funduscopic exam on the OD.  OS with opaque lens and decreased vision at base. CV:  Good peripheral perfusion. RRR RESP:  Normal effort. CTA ABD:  No distention.  Soft and nontender.  Normal bowel sounds  noted. SKIN:  Patient with 2 distinct, tender areas of early group erythematous papules in the area on her left thoracic region and in a single dermatomal pattern.  NEURO: Cranial nerves II to XII grossly intact.  Normal composite fist and bilateral grip strength to the upper extremities.  No cerebellar ataxia appreciated.  Normal gait without antalgia/ataxia.   ED Results / Procedures / Treatments   Labs (all labs ordered are listed, but only abnormal results are displayed) Labs Reviewed  BASIC METABOLIC PANEL - Abnormal; Notable for the following components:      Result Value   Glucose, Bld 202 (*)    All other components within normal limits  CBC - Abnormal; Notable for the following components:   RBC 5.50 (*)    HCT 46.5 (*)    All other components within normal limits  LIPASE, BLOOD - Abnormal; Notable for the following components:   Lipase 60 (*)    All other components within normal limits  HEPATIC FUNCTION PANEL  PROTIME-INR  TROPONIN I (HIGH SENSITIVITY)  TROPONIN I (HIGH  SENSITIVITY)     EKG  Vent. rate 95 BPM PR interval 180 ms QRS duration 96 ms QT/QTcB 362/454 ms P-R-T axes 58 39 62 Normal sinus rhythm Cannot rule out Anterior infarct (cited on or before 15-Jan-2022) Abnormal ECG When compared  RADIOLOGY   No results found.   PROCEDURES:  Critical Care performed: No  Procedures   MEDICATIONS ORDERED IN ED: Medications  LORazepam (ATIVAN) tablet 1 mg (1 mg Oral Given 09/27/22 2240)  valACYclovir (VALTREX) tablet 1,000 mg (1,000 mg Oral Given 09/27/22 2240)  lidocaine (LMX) 4 % cream ( Topical Given 09/27/22 2252)  acetaminophen (TYLENOL) tablet 1,000 mg (1,000 mg Oral Given 09/27/22 2240)  gadobutrol (GADAVIST) 1 MMOL/ML injection 7.5 mL (7.5 mLs Intravenous Contrast Given 09/28/22 0006)     IMPRESSION / MDM / ASSESSMENT AND PLAN / ED COURSE  I reviewed the triage vital signs and the nursing notes.                              Differential  diagnosis includes, but is not limited to, intracranial hemorrhage, meningitis/encephalitis, previous head trauma, cavernous venous thrombosis, tension headache, temporal arteritis, migraine or migraine equivalent, idiopathic intracranial hypertension, and non-specific headache.   Patient's presentation is most consistent with acute presentation with potential threat to life or bodily function.  Patient's diagnosis is consistent with TIA on presentation.  Patient with remainder of her clinical exam pending results for MRI ultrasound at the time of this disposition.  Care has been transferred to my attending, Delton Prairie, MD, who will await results and disposition patient accordingly.  Patient will be discharged home with prescriptions for Lidoderm ointment and valacyclovir. Patient is to follow up with her primary provider, cardiologist, and neurologist as discussed, as needed or otherwise directed. Patient is given ED precautions to return to the ED for any worsening or new symptoms.     FINAL CLINICAL IMPRESSION(S) / ED DIAGNOSES   Final diagnoses:  Herpes zoster without complication  Nonspecific chest pain  TIA (transient ischemic attack)     Rx / DC Orders   ED Discharge Orders          Ordered    lidocaine (XYLOCAINE) 5 % ointment  3 times daily PRN        09/28/22 0001    valACYclovir (VALTREX) 500 MG tablet  3 times daily        09/28/22 0001             Note:  This document was prepared using Dragon voice recognition software and may include unintentional dictation errors.    Lissa Hoard, PA-C 09/30/22 1534    Minna Antis, MD 10/02/22 1551

## 2022-09-28 MED ORDER — VALACYCLOVIR HCL 500 MG PO TABS: 1000.0000 mg | ORAL_TABLET | Freq: Three times a day (TID) | ORAL | 0 refills | Status: AC

## 2022-09-28 MED ORDER — LIDOCAINE 5 % EX OINT: 1.0000 | TOPICAL_OINTMENT | Freq: Three times a day (TID) | CUTANEOUS | 1 refills | Status: AC | PRN

## 2022-09-28 MED ORDER — GADOBUTROL 1 MMOL/ML IV SOLN
7.5000 mL | Freq: Once | INTRAVENOUS | Status: AC | PRN
  Administered 2022-09-28: 7.5 mL via INTRAVENOUS

## 2022-09-28 NOTE — ED Provider Notes (Signed)
Patient received in signout from [facility] [place] pending MRI brain and RUQ ultrasound.  Diabetic patient with history of fibromyalgia who presents with various complaints including sensation of heaviness to the right arm, nausea and epigastric discomfort.  RUQ ultrasound with cholecystectomy, steatosis but no acute findings.  Similarly, MRI brain without evidence of acute features.  She is 2 negative troponins, normal LFTs, minimally elevated lipase, normal CBC.  Metabolic panel similarly reassuring.  Overall benign workup, she is tolerating p.o. intake and suitable for outpatient management.  Possibly related to her fibromyalgia versus gastroparesis.  We discussed PCP follow-up and return precautions.   Delton Prairie, MD 09/28/22 [phone]

## 2022-10-21 ENCOUNTER — Other Ambulatory Visit: Payer: Self-pay | Admitting: Physician Assistant

## 2022-10-21 DIAGNOSIS — Z1231 Encounter for screening mammogram for malignant neoplasm of breast: Secondary | ICD-10-CM

## 2022-10-25 ENCOUNTER — Other Ambulatory Visit (HOSPITAL_COMMUNITY): Payer: Self-pay | Admitting: Family Medicine

## 2022-10-25 DIAGNOSIS — R079 Chest pain, unspecified: Secondary | ICD-10-CM

## 2022-10-26 ENCOUNTER — Other Ambulatory Visit (HOSPITAL_COMMUNITY): Payer: Self-pay | Admitting: Emergency Medicine

## 2022-10-26 ENCOUNTER — Encounter (HOSPITAL_COMMUNITY): Payer: Self-pay

## 2022-10-26 DIAGNOSIS — R079 Chest pain, unspecified: Secondary | ICD-10-CM

## 2022-10-26 MED ORDER — METOPROLOL TARTRATE 100 MG PO TABS
100.0000 mg | ORAL_TABLET | Freq: Once | ORAL | 0 refills | Status: DC
Start: 2022-10-26 — End: 2022-11-16

## 2022-11-02 ENCOUNTER — Telehealth (HOSPITAL_COMMUNITY): Payer: Self-pay | Admitting: *Deleted

## 2022-11-02 NOTE — Telephone Encounter (Signed)
Attempted to call patient regarding upcoming cardiac CT appointment. °Left message on voicemail with name and callback number ° °Edie Vallandingham RN Navigator Cardiac Imaging °[facility] °[phone] Office °[phone] Cell ° °

## 2022-11-03 ENCOUNTER — Telehealth (HOSPITAL_COMMUNITY): Payer: Self-pay | Admitting: *Deleted

## 2022-11-03 NOTE — Telephone Encounter (Signed)
Reaching out to patient to offer assistance regarding upcoming cardiac imaging study; pt verbalizes understanding of appt date/time, parking situation and where to check in, pre-test NPO status and medications ordered, and verified current allergies; name and call back number provided for further questions should they arise Hayley Sharpe RN Navigator Cardiac Imaging [facility] [phone] office [phone] cell  

## 2022-11-04 ENCOUNTER — Encounter (HOSPITAL_COMMUNITY): Payer: Self-pay

## 2022-11-04 ENCOUNTER — Ambulatory Visit (HOSPITAL_COMMUNITY)
Admission: RE | Admit: 2022-11-04 | Discharge: 2022-11-04 | Disposition: A | Discharge status: Home / Self Care | Payer: 59 | Source: Ambulatory Visit | Attending: Family Medicine | Admitting: Family Medicine

## 2022-11-04 DIAGNOSIS — R079 Chest pain, unspecified: Secondary | ICD-10-CM | POA: Insufficient documentation

## 2022-11-04 MED ORDER — METOPROLOL TARTRATE 5 MG/5ML IV SOLN
5.0000 mg | Freq: Once | INTRAVENOUS | Status: AC
  Administered 2022-11-04: 5 mg via INTRAVENOUS

## 2022-11-04 MED ORDER — METOPROLOL TARTRATE 5 MG/5ML IV SOLN
INTRAVENOUS | Status: AC
  Filled 2022-11-04: qty 10

## 2022-11-04 MED ORDER — DILTIAZEM HCL 25 MG/5ML IV SOLN
INTRAVENOUS | Status: AC
  Filled 2022-11-04: qty 5

## 2022-11-04 MED ORDER — NITROGLYCERIN 0.4 MG SL SUBL: 0.8000 mg | SUBLINGUAL_TABLET | Freq: Once | SUBLINGUAL | Status: DC

## 2022-11-04 MED ORDER — DILTIAZEM HCL 25 MG/5ML IV SOLN: 10.0000 mg | Freq: Once | INTRAVENOUS | Status: DC

## 2022-11-04 MED ORDER — METOPROLOL TARTRATE 5 MG/5ML IV SOLN
10.0000 mg | Freq: Once | INTRAVENOUS | Status: AC
  Administered 2022-11-04: 10 mg via INTRAVENOUS

## 2022-11-04 MED ORDER — DILTIAZEM LOAD VIA INFUSION: 10.0000 mg | Freq: Once | INTRAVENOUS | Status: DC

## 2022-11-04 MED ORDER — NITROGLYCERIN 0.4 MG SL SUBL
SUBLINGUAL_TABLET | SUBLINGUAL | Status: AC
  Filled 2022-11-04: qty 2

## 2022-11-04 NOTE — Progress Notes (Addendum)
Pt was scheduled for CT Coronary test today. IV metoprolol given and charted on chart. However pt was removed from system before physically being able to chart cardizem.   Cardizem 10 mg IV administered at 1245  12 47: Bp 127/55 heart rate 82 Cardizem 10 mg IV administered at 1250 12 52: Bp 118/57 heart rate 81  Dr. Anne Fu made aware of heart rate still sustained in the 80s. MD canceled scan and states patient will have to be rescheduled with different pre medications in the future.   Patient made aware of situation & understands.  RN d/c IV. RN d/c patient.

## 2022-11-11 ENCOUNTER — Ambulatory Visit
Admission: RE | Admit: 2022-11-11 | Discharge: 2022-11-11 | Disposition: A | Discharge status: Home / Self Care | Payer: 59 | Source: Ambulatory Visit | Attending: Physician Assistant | Admitting: Physician Assistant

## 2022-11-11 DIAGNOSIS — Z1231 Encounter for screening mammogram for malignant neoplasm of breast: Secondary | ICD-10-CM

## 2022-11-16 ENCOUNTER — Telehealth (HOSPITAL_COMMUNITY): Payer: Self-pay | Admitting: Emergency Medicine

## 2022-11-16 DIAGNOSIS — R079 Chest pain, unspecified: Secondary | ICD-10-CM

## 2022-11-16 MED ORDER — IVABRADINE HCL 5 MG PO TABS
15.0000 mg | ORAL_TABLET | Freq: Once | ORAL | 0 refills | Status: AC
Start: 2022-11-16 — End: 2022-11-16

## 2022-11-16 MED ORDER — METOPROLOL TARTRATE 100 MG PO TABS: 100.0000 mg | ORAL_TABLET | Freq: Once | ORAL | 0 refills | Status: DC

## 2022-11-16 NOTE — Telephone Encounter (Signed)
Attempted to call patient regarding upcoming cardiac CT appointment. Left message on voicemail with name and callback number Rockwell Alexandria RN Navigator Cardiac Imaging [facility] [phone] Office [phone] Cell  Sent meds to pharm on file 100mg  metoprolol  15mg  ivabradine

## 2022-11-17 ENCOUNTER — Telehealth (HOSPITAL_COMMUNITY): Payer: Self-pay | Admitting: Emergency Medicine

## 2022-11-17 NOTE — Telephone Encounter (Signed)
Attempted to call patient regarding upcoming cardiac CT appointment. °Left message on voicemail with name and callback number °Dwan Fennel RN Navigator Cardiac Imaging °[facility] °[phone] Office °[phone] Cell ° °

## 2022-11-18 ENCOUNTER — Ambulatory Visit (HOSPITAL_COMMUNITY)
Admission: RE | Admit: 2022-11-18 | Discharge: 2022-11-18 | Disposition: A | Discharge status: Home / Self Care | Payer: 59 | Source: Ambulatory Visit | Attending: Family Medicine | Admitting: Family Medicine

## 2022-11-18 ENCOUNTER — Ambulatory Visit (HOSPITAL_BASED_OUTPATIENT_CLINIC_OR_DEPARTMENT_OTHER)
Admission: RE | Admit: 2022-11-18 | Discharge: 2022-11-18 | Disposition: A | Discharge status: Home / Self Care | Payer: 59 | Source: Ambulatory Visit | Attending: Cardiology | Admitting: Cardiology

## 2022-11-18 ENCOUNTER — Other Ambulatory Visit: Payer: Self-pay | Admitting: Cardiology

## 2022-11-18 DIAGNOSIS — I2510 Atherosclerotic heart disease of native coronary artery without angina pectoris: Secondary | ICD-10-CM | POA: Insufficient documentation

## 2022-11-18 DIAGNOSIS — R931 Abnormal findings on diagnostic imaging of heart and coronary circulation: Secondary | ICD-10-CM

## 2022-11-18 DIAGNOSIS — R079 Chest pain, unspecified: Secondary | ICD-10-CM | POA: Insufficient documentation

## 2022-11-18 MED ORDER — IOHEXOL 350 MG/ML SOLN
100.0000 mL | Freq: Once | INTRAVENOUS | Status: AC | PRN
  Administered 2022-11-18: 100 mL via INTRAVENOUS

## 2022-11-18 MED ORDER — NITROGLYCERIN 0.4 MG SL SUBL
SUBLINGUAL_TABLET | SUBLINGUAL | Status: AC
  Filled 2022-11-18: qty 2

## 2022-11-18 MED ORDER — NITROGLYCERIN 0.4 MG SL SUBL
0.8000 mg | SUBLINGUAL_TABLET | Freq: Once | SUBLINGUAL | Status: AC
  Administered 2022-11-18: 0.8 mg via SUBLINGUAL

## 2022-11-22 ENCOUNTER — Observation Stay (HOSPITAL_COMMUNITY)
Admission: EM | Admit: 2022-11-22 | Discharge: 2022-11-23 | Disposition: A | Discharge status: Home / Self Care | Payer: 59 | Attending: Cardiovascular Disease | Admitting: Cardiovascular Disease

## 2022-11-22 ENCOUNTER — Emergency Department (HOSPITAL_COMMUNITY): Payer: 59

## 2022-11-22 ENCOUNTER — Encounter (HOSPITAL_COMMUNITY): Payer: Self-pay | Admitting: Pharmacy Technician

## 2022-11-22 ENCOUNTER — Other Ambulatory Visit: Payer: Self-pay

## 2022-11-22 DIAGNOSIS — Z79899 Other long term (current) drug therapy: Secondary | ICD-10-CM | POA: Insufficient documentation

## 2022-11-22 DIAGNOSIS — Z23 Encounter for immunization: Secondary | ICD-10-CM | POA: Diagnosis not present

## 2022-11-22 DIAGNOSIS — Z7982 Long term (current) use of aspirin: Secondary | ICD-10-CM | POA: Diagnosis not present

## 2022-11-22 DIAGNOSIS — Z8673 Personal history of transient ischemic attack (TIA), and cerebral infarction without residual deficits: Secondary | ICD-10-CM | POA: Diagnosis not present

## 2022-11-22 DIAGNOSIS — Z1231 Encounter for screening mammogram for malignant neoplasm of breast: Secondary | ICD-10-CM | POA: Insufficient documentation

## 2022-11-22 DIAGNOSIS — G894 Chronic pain syndrome: Secondary | ICD-10-CM | POA: Diagnosis present

## 2022-11-22 DIAGNOSIS — J449 Chronic obstructive pulmonary disease, unspecified: Secondary | ICD-10-CM | POA: Diagnosis not present

## 2022-11-22 DIAGNOSIS — E1122 Type 2 diabetes mellitus with diabetic chronic kidney disease: Secondary | ICD-10-CM | POA: Insufficient documentation

## 2022-11-22 DIAGNOSIS — E1165 Type 2 diabetes mellitus with hyperglycemia: Secondary | ICD-10-CM | POA: Diagnosis not present

## 2022-11-22 DIAGNOSIS — I2510 Atherosclerotic heart disease of native coronary artery without angina pectoris: Secondary | ICD-10-CM | POA: Diagnosis not present

## 2022-11-22 DIAGNOSIS — Z794 Long term (current) use of insulin: Secondary | ICD-10-CM | POA: Diagnosis not present

## 2022-11-22 DIAGNOSIS — N183 Chronic kidney disease, stage 3 unspecified: Secondary | ICD-10-CM | POA: Insufficient documentation

## 2022-11-22 DIAGNOSIS — E118 Type 2 diabetes mellitus with unspecified complications: Secondary | ICD-10-CM

## 2022-11-22 DIAGNOSIS — R0789 Other chest pain: Secondary | ICD-10-CM | POA: Diagnosis not present

## 2022-11-22 DIAGNOSIS — R079 Chest pain, unspecified: Principal | ICD-10-CM | POA: Insufficient documentation

## 2022-11-22 DIAGNOSIS — N179 Acute kidney failure, unspecified: Secondary | ICD-10-CM | POA: Diagnosis not present

## 2022-11-22 DIAGNOSIS — I129 Hypertensive chronic kidney disease with stage 1 through stage 4 chronic kidney disease, or unspecified chronic kidney disease: Secondary | ICD-10-CM | POA: Insufficient documentation

## 2022-11-22 DIAGNOSIS — I25119 Atherosclerotic heart disease of native coronary artery with unspecified angina pectoris: Secondary | ICD-10-CM

## 2022-11-22 DIAGNOSIS — I209 Angina pectoris, unspecified: Secondary | ICD-10-CM | POA: Diagnosis not present

## 2022-11-22 DIAGNOSIS — R0609 Other forms of dyspnea: Secondary | ICD-10-CM | POA: Insufficient documentation

## 2022-11-22 DIAGNOSIS — I10 Essential (primary) hypertension: Secondary | ICD-10-CM | POA: Diagnosis present

## 2022-11-22 DIAGNOSIS — E785 Hyperlipidemia, unspecified: Secondary | ICD-10-CM | POA: Diagnosis not present

## 2022-11-22 LAB — BASIC METABOLIC PANEL
Anion gap: 6 (ref 5–15)
BUN: 8 mg/dL (ref 6–20)
CO2: 26 mmol/L (ref 22–32)
Calcium: 8.8 mg/dL — ABNORMAL LOW (ref 8.9–10.3)
Chloride: 108 mmol/L (ref 98–111)
Creatinine, Ser: 1.04 mg/dL — ABNORMAL HIGH (ref 0.44–1.00)
GFR, Estimated: >60 mL/min (ref >60)
Glucose, Bld: 294 mg/dL — ABNORMAL HIGH (ref 70–99)
Potassium: 3.9 mmol/L (ref 3.5–5.1)
Sodium: 140 mmol/L (ref 135–145)

## 2022-11-22 LAB — CBG MONITORING, ED: Glucose-Capillary: 215 mg/dL — ABNORMAL HIGH (ref 70–99)

## 2022-11-22 LAB — CBC
HCT: 43.7 % (ref 36.0–46.0)
HCT: 41.4 % (ref 36.0–46.0)
Hemoglobin: 13.7 g/dL (ref 12.0–15.0)
Hemoglobin: 13.1 g/dL (ref 12.0–15.0)
MCH: 26.7 pg (ref 26.0–34.0)
MCH: 26.4 pg (ref 26.0–34.0)
MCHC: 31.4 g/dL (ref 30.0–36.0)
MCHC: 31.6 g/dL (ref 30.0–36.0)
MCV: 85.2 fL (ref 80.0–100.0)
MCV: 83.3 fL (ref 80.0–100.0)
Platelets: 217 10*3/uL (ref 150–400)
Platelets: 215 10*3/uL (ref 150–400)
RBC: 5.13 MIL/uL — ABNORMAL HIGH (ref 3.87–5.11)
RBC: 4.97 MIL/uL (ref 3.87–5.11)
RDW: 13.2 % (ref 11.5–15.5)
RDW: 13.4 % (ref 11.5–15.5)
WBC: 5 10*3/uL (ref 4.0–10.5)
WBC: 6.9 10*3/uL (ref 4.0–10.5)
nRBC: 0 % (ref 0.0–0.2)
nRBC: 0 % (ref 0.0–0.2)

## 2022-11-22 LAB — GLUCOSE, CAPILLARY
Glucose-Capillary: 253 mg/dL — ABNORMAL HIGH (ref 70–99)
Glucose-Capillary: 280 mg/dL — ABNORMAL HIGH (ref 70–99)

## 2022-11-22 LAB — HEPATIC FUNCTION PANEL
ALT: 18 U/L (ref 0–44)
AST: 20 U/L (ref 15–41)
Albumin: 3.5 g/dL (ref 3.5–5.0)
Alkaline Phosphatase: 68 U/L (ref 38–126)
Bilirubin, Direct: 0.3 mg/dL — ABNORMAL HIGH (ref 0.0–0.2)
Indirect Bilirubin: 0.4 mg/dL (ref 0.3–0.9)
Total Bilirubin: 0.7 mg/dL (ref 0.3–1.2)
Total Protein: 6.3 g/dL — ABNORMAL LOW (ref 6.5–8.1)

## 2022-11-22 LAB — TROPONIN I (HIGH SENSITIVITY)
Troponin I (High Sensitivity): 4 ng/L (ref <18)
Troponin I (High Sensitivity): 3 ng/L (ref <18)

## 2022-11-22 LAB — SURGICAL PCR SCREEN
MRSA, PCR: NEGATIVE
Staphylococcus aureus: NEGATIVE

## 2022-11-22 LAB — HIV ANTIBODY (ROUTINE TESTING W REFLEX): HIV Screen 4th Generation wRfx: NONREACTIVE

## 2022-11-22 LAB — HEMOGLOBIN A1C
Hgb A1c MFr Bld: 9.4 % — ABNORMAL HIGH (ref 4.8–5.6)
Mean Plasma Glucose: 223.08 mg/dL

## 2022-11-22 LAB — CREATININE, SERUM
Creatinine, Ser: 1.05 mg/dL — ABNORMAL HIGH (ref 0.44–1.00)
GFR, Estimated: >60 mL/min (ref >60)

## 2022-11-22 LAB — TSH: TSH: 4.196 u[IU]/mL (ref 0.350–4.500)

## 2022-11-22 LAB — D-DIMER, QUANTITATIVE: D-Dimer, Quant: 0.35 ug{FEU}/mL (ref 0.00–0.50)

## 2022-11-22 MED ORDER — BUSPIRONE HCL 10 MG PO TABS
10.0000 mg | ORAL_TABLET | Freq: Every day | ORAL | Status: DC
  Administered 2022-11-22: 10 mg via ORAL
  Filled 2022-11-22: qty 1

## 2022-11-22 MED ORDER — PREGABALIN 75 MG PO CAPS
75.0000 mg | ORAL_CAPSULE | Freq: Every day | ORAL | Status: DC
  Administered 2022-11-22: 75 mg via ORAL
  Filled 2022-11-22: qty 1

## 2022-11-22 MED ORDER — POLYMYXIN B-TRIMETHOPRIM 10000-0.1 UNIT/ML-% OP SOLN
1.0000 [drp] | Freq: Three times a day (TID) | OPHTHALMIC | Status: DC
  Administered 2022-11-22 – 2022-11-23 (×3): 1 [drp] via OPHTHALMIC
  Filled 2022-11-22: qty 10

## 2022-11-22 MED ORDER — PNEUMOCOCCAL 20-VAL CONJ VACC 0.5 ML IM SUSY
0.5000 mL | PREFILLED_SYRINGE | INTRAMUSCULAR | Status: AC
  Administered 2022-11-23: 0.5 mL via INTRAMUSCULAR
  Filled 2022-11-22: qty 0.5

## 2022-11-22 MED ORDER — FAMOTIDINE 20 MG PO TABS
20.0000 mg | ORAL_TABLET | Freq: Two times a day (BID) | ORAL | Status: DC
  Administered 2022-11-22 – 2022-11-23 (×2): 20 mg via ORAL
  Filled 2022-11-22 (×2): qty 1

## 2022-11-22 MED ORDER — ROSUVASTATIN CALCIUM 20 MG PO TABS
40.0000 mg | ORAL_TABLET | Freq: Every day | ORAL | Status: DC
  Administered 2022-11-23: 40 mg via ORAL
  Filled 2022-11-22: qty 2

## 2022-11-22 MED ORDER — SODIUM CHLORIDE 0.9 % IV SOLN: 250.0000 mL | INTRAVENOUS | Status: DC | PRN

## 2022-11-22 MED ORDER — ADULT MULTIVITAMIN W/MINERALS CH
1.0000 | ORAL_TABLET | Freq: Every day | ORAL | Status: DC
  Administered 2022-11-22: 1 via ORAL
  Filled 2022-11-22: qty 1

## 2022-11-22 MED ORDER — BRIMONIDINE TARTRATE 0.2 % OP SOLN
1.0000 [drp] | Freq: Two times a day (BID) | OPHTHALMIC | Status: DC
  Administered 2022-11-22 – 2022-11-23 (×2): 1 [drp] via OPHTHALMIC
  Filled 2022-11-22: qty 5

## 2022-11-22 MED ORDER — HEPARIN SODIUM (PORCINE) 5000 UNIT/ML IJ SOLN
5000.0000 [IU] | Freq: Three times a day (TID) | INTRAMUSCULAR | Status: DC
  Administered 2022-11-22 – 2022-11-23 (×3): 5000 [IU] via SUBCUTANEOUS
  Filled 2022-11-22 (×3): qty 1

## 2022-11-22 MED ORDER — GABAPENTIN 300 MG PO CAPS
300.0000 mg | ORAL_CAPSULE | Freq: Every morning | ORAL | Status: DC
  Filled 2022-11-22: qty 1

## 2022-11-22 MED ORDER — NITROGLYCERIN 0.4 MG SL SUBL: 0.4000 mg | SUBLINGUAL_TABLET | SUBLINGUAL | Status: DC | PRN

## 2022-11-22 MED ORDER — PANTOPRAZOLE SODIUM 40 MG PO TBEC
80.0000 mg | DELAYED_RELEASE_TABLET | Freq: Every day | ORAL | Status: DC
  Administered 2022-11-23: 80 mg via ORAL
  Filled 2022-11-22: qty 2

## 2022-11-22 MED ORDER — INSULIN PUMP
SUBCUTANEOUS | Status: DC
  Filled 2022-11-22: qty 1

## 2022-11-22 MED ORDER — DIPHENHYDRAMINE HCL 50 MG/ML IJ SOLN
12.5000 mg | Freq: Once | INTRAMUSCULAR | Status: AC
  Administered 2022-11-22: 12.5 mg via INTRAVENOUS
  Filled 2022-11-22: qty 1

## 2022-11-22 MED ORDER — ONDANSETRON 4 MG PO TBDP: 4.0000 mg | ORAL_TABLET | Freq: Two times a day (BID) | ORAL | Status: DC | PRN

## 2022-11-22 MED ORDER — SODIUM CHLORIDE 0.9% FLUSH: 3.0000 mL | INTRAVENOUS | Status: DC | PRN

## 2022-11-22 MED ORDER — TIMOLOL MALEATE 0.5 % OP SOLN
1.0000 [drp] | Freq: Two times a day (BID) | OPHTHALMIC | Status: DC
  Administered 2022-11-22 – 2022-11-23 (×2): 1 [drp] via OPHTHALMIC
  Filled 2022-11-22: qty 5

## 2022-11-22 MED ORDER — HYDROMORPHONE HCL 1 MG/ML IJ SOLN
1.0000 mg | Freq: Once | INTRAMUSCULAR | Status: AC
  Administered 2022-11-22: 1 mg via INTRAVENOUS
  Filled 2022-11-22: qty 1

## 2022-11-22 MED ORDER — ASPIRIN 81 MG PO CHEW
81.0000 mg | CHEWABLE_TABLET | ORAL | Status: AC
  Administered 2022-11-23: 81 mg via ORAL
  Filled 2022-11-22: qty 1

## 2022-11-22 MED ORDER — DICYCLOMINE HCL 10 MG PO CAPS: 10.0000 mg | ORAL_CAPSULE | Freq: Three times a day (TID) | ORAL | Status: DC | PRN

## 2022-11-22 MED ORDER — SODIUM CHLORIDE 0.9 % WEIGHT BASED INFUSION
3.0000 mL/kg/h | INTRAVENOUS | Status: DC
  Administered 2022-11-23: 3 mL/kg/h via INTRAVENOUS

## 2022-11-22 MED ORDER — METOPROLOL TARTRATE 25 MG PO TABS
25.0000 mg | ORAL_TABLET | Freq: Two times a day (BID) | ORAL | Status: DC
  Administered 2022-11-22 – 2022-11-23 (×2): 25 mg via ORAL
  Filled 2022-11-22 (×2): qty 1

## 2022-11-22 MED ORDER — HYDROCODONE-ACETAMINOPHEN 10-325 MG PO TABS
1.0000 | ORAL_TABLET | Freq: Three times a day (TID) | ORAL | Status: DC | PRN
  Administered 2022-11-22: 1 via ORAL
  Filled 2022-11-22: qty 1

## 2022-11-22 MED ORDER — LORAZEPAM 0.5 MG PO TABS: 0.5000 mg | ORAL_TABLET | Freq: Every day | ORAL | Status: DC | PRN

## 2022-11-22 MED ORDER — MULTIVITAMIN ADULTS 50+ PO TABS: 1.0000 | ORAL_TABLET | Freq: Every day | ORAL | Status: DC

## 2022-11-22 MED ORDER — TOPIRAMATE 100 MG PO TABS
100.0000 mg | ORAL_TABLET | Freq: Two times a day (BID) | ORAL | Status: DC
  Administered 2022-11-22 – 2022-11-23 (×2): 100 mg via ORAL
  Filled 2022-11-22 (×3): qty 1

## 2022-11-22 MED ORDER — BRIMONIDINE TARTRATE-TIMOLOL 0.2-0.5 % OP SOLN: 1.0000 [drp] | Freq: Two times a day (BID) | OPHTHALMIC | Status: DC

## 2022-11-22 MED ORDER — MUPIROCIN 2 % EX OINT
1.0000 | TOPICAL_OINTMENT | Freq: Two times a day (BID) | CUTANEOUS | Status: DC
  Administered 2022-11-22: 1 via NASAL
  Filled 2022-11-22: qty 22

## 2022-11-22 MED ORDER — METOCLOPRAMIDE HCL 5 MG PO TABS
5.0000 mg | ORAL_TABLET | Freq: Two times a day (BID) | ORAL | Status: DC
  Administered 2022-11-22 – 2022-11-23 (×2): 5 mg via ORAL
  Filled 2022-11-22 (×2): qty 1

## 2022-11-22 MED ORDER — LORATADINE 10 MG PO TABS
10.0000 mg | ORAL_TABLET | Freq: Every day | ORAL | Status: DC
  Administered 2022-11-23: 10 mg via ORAL
  Filled 2022-11-22: qty 1

## 2022-11-22 MED ORDER — SODIUM CHLORIDE 0.9% FLUSH
3.0000 mL | Freq: Two times a day (BID) | INTRAVENOUS | Status: DC
  Administered 2022-11-22 – 2022-11-23 (×2): 3 mL via INTRAVENOUS

## 2022-11-22 MED ORDER — ONDANSETRON HCL 4 MG/2ML IJ SOLN: 4.0000 mg | Freq: Four times a day (QID) | INTRAMUSCULAR | Status: DC | PRN

## 2022-11-22 MED ORDER — ASPIRIN 81 MG PO TBEC: 81.0000 mg | DELAYED_RELEASE_TABLET | Freq: Every day | ORAL | Status: DC

## 2022-11-22 MED ORDER — MILNACIPRAN HCL 50 MG PO TABS
50.0000 mg | ORAL_TABLET | Freq: Two times a day (BID) | ORAL | Status: DC
  Administered 2022-11-22 – 2022-11-23 (×2): 50 mg via ORAL
  Filled 2022-11-22 (×3): qty 1

## 2022-11-22 MED ORDER — LATANOPROST 0.005 % OP SOLN
1.0000 [drp] | Freq: Every day | OPHTHALMIC | Status: DC
  Administered 2022-11-22: 1 [drp] via OPHTHALMIC
  Filled 2022-11-22: qty 2.5

## 2022-11-22 MED ORDER — SODIUM CHLORIDE 0.9 % WEIGHT BASED INFUSION: 1.0000 mL/kg/h | INTRAVENOUS | Status: DC

## 2022-11-22 MED ORDER — MONTELUKAST SODIUM 10 MG PO TABS
10.0000 mg | ORAL_TABLET | Freq: Every day | ORAL | Status: DC
  Administered 2022-11-23: 10 mg via ORAL
  Filled 2022-11-22: qty 1

## 2022-11-22 MED ORDER — METHOCARBAMOL 500 MG PO TABS: 500.0000 mg | ORAL_TABLET | Freq: Four times a day (QID) | ORAL | Status: DC | PRN

## 2022-11-22 NOTE — Inpatient Diabetes Management (Signed)
Inpatient Diabetes Program Recommendations  AACE/ADA: New Consensus Statement on Inpatient Glycemic Control (2015)  Target Ranges:  Prepandial:   less than 140 mg/dL      Peak postprandial:   less than 180 mg/dL (1-2 hours)      Critically ill patients:  140 - 180 mg/dL    Latest Reference Range & Units 11/22/22 12:58  Glucose 70 - 99 mg/dL 161 (H)  (H): Data is abnormally high    Admit with: CP  History: DM  Home DM Meds: Insulin Pump  Current Orders: Insulin Pump Q4 hours    Spoke w/ PA from Cardiology team.  PA would like to allow pt to continue home insulin pump for now.  Per PA, pt A&O and able to handle pump independently.  Assisted PA to place Insulin Pump Orders for Q4 hour monitoring.     ENDO: [facility] Seen 11/18/2022 Omni Pod Dash Insulin Pump Looks like Basal rate was increased to 1.35 units/hr Total Basal rate= 32.4 units Carb Ratio 1:10 Correction Factor 1:40 CBG goal 130    If pt becomes unable to independently manage her insulin pump in hospital, recommend the following SQ Insulin regimen:  1. Start Semglee 28 units Daily (90% total home dose) Remove insulin pump 1 hour after Semglee on board  2. Start Novolog Sensitive Correction Scale/ SSI (0-9 units) TID AC + HS  3. Will need Novolog Meal Coverage too, but only when eating: Novolog 6 units TID with meals    --Will follow patient during hospitalization--  Ambrose Finland RN, MSN, CDCES Diabetes Coordinator Inpatient Glycemic Control Team Team Pager: [phone] (8a-5p)
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
## 2022-11-22 NOTE — ED Provider Notes (Signed)
[facility] DEPARTMENT AT [facility] Provider Note   CSN: [id number] Arrival date & time: 11/22/22  1241     History {Add pertinent medical, surgical, social history, OB history to HPI:1} Chief Complaint  Patient presents with   Chest Pain    Annette Norman is a 57 y.o. female with history of diabetes, high cholesterol, coronary disease, presented to ED with complaint of chest discomfort.  Patient reports that she has had a settled pressure on the left side of her chest that is pleuritic and radiating to her shoulder since last night.  It is more intense and she has never had before.  She takes 81 mg baby aspirin daily including today, and also tried; nitroglycerin in the relief of her symptoms.  She was concerned because she had a coronary CT done very recently earlier this week and was told that she has multiple levels of blockages around her heart.  She also significant family history of MI and her brother at a young age under the age of 43.  Coronary CT report from 11/18/22: IMPRESSION: 1. Coronary calcium score of 732. This was 99th percentile for age-, sex, and race-matched controls.   2. Total plaque volume 780 mm3 which is 95th percentile for age- and sex-matched controls (calcified plaque 134mm3; non-calcified plaque 638mm3). TPV is extensive.   3. Normal coronary origin with right dominance.   4. 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   5. Recommend cardiac catheterization.   6. Consider symptom-guided anti-ischemic and preventive pharmacotherapy as well as risk factor modification per guideline-directed care.   7. This study has been submitted for FFR analysisi.  Dr Gloris Manchester Turner's note 11/18/22 coronary CT:  IMPRESSION: 1. Coronary CTA FFR analysis demonstrates possible hemodynamically flow limiting lesion in the distal LAD (FFR).95>0.85>0.69).   2.  Recommend cardiac catheterization.   HPI     Home Medications Prior to  Admission medications   Medication Sig Start Date End Date Taking? Authorizing Provider  aspirin EC 81 MG tablet Take 81 mg by mouth at bedtime.    [provider]  BAQSIMI TWO PACK 3 MG/DOSE POWD Place 3 mg into the nose once as needed. 06/24/21   [provider]  brimonidine-timolol (COMBIGAN) 0.2-0.5 % ophthalmic solution Place 1 drop into the left eye 2 (two) times daily. 01/27/16   [provider]  busPIRone (BUSPAR) 10 MG tablet Take 10 mg by mouth at bedtime.    [provider]  Cholecalciferol (VITAMIN D3) 50 MCG (2000 UT) TABS Take 1 tablet by mouth daily. 04/30/21   [provider]  CRESTOR 40 MG tablet Take 40 mg by mouth at bedtime.  06/02/14   [provider]  EPINEPHrine 0.3 mg/0.3 mL IJ SOAJ injection Inject 0.3 mg into the muscle as needed for anaphylaxis. 03/31/17   [provider]  famotidine (PEPCID) 20 MG tablet Take 20 mg by mouth at bedtime. 03/08/21   [provider]  FARXIGA 10 MG TABS tablet Take 10 mg by mouth daily. 07/05/21   [provider]  Fremanezumab-vfrm (AJOVY) 225 MG/1.5ML SOAJ Inject 225 mg into the skin every 30 (thirty) days. 08/23/21   Lomax, Amy, NP  gabapentin (NEURONTIN) 300 MG capsule Take 300 mg by mouth 2 (two) times daily. 08/27/18   [provider]  HUMALOG KWIKPEN 100 UNIT/ML KwikPen Inject 3-21 Units into the skin 3 (three) times daily before meals. 06/24/21   [provider]  HYDROcodone-acetaminophen [facility]) 78-295  MG tablet Take 1 tablet by mouth 4 (four) times daily as needed. Home med 06/03/21   [name], MD  LEVEMIR FLEXPEN 100 UNIT/ML FlexPen Inject 54 Units into the skin at bedtime. 06/24/21   [provider]  levETIRAcetam (KEPPRA) 500 MG tablet Take 1 tablet (500 mg total) by mouth 2 (two) times daily. 08/23/21   Lomax, Amy, NP  lidocaine (XYLOCAINE) 5 % ointment Apply 1 Application topically 3 (three) times daily as needed for mild pain. 09/28/22    Menshew, Charlesetta Ivory, PA-C  meloxicam (MOBIC) 15 MG tablet Take 1 tablet (15 mg total) by mouth daily as needed for pain. Home med. 06/03/21   [name], MD  methocarbamol (ROBAXIN) 500 MG tablet Take 500-1,000 mg by mouth 4 (four) times daily as needed for muscle pain. 06/05/21   [provider]  metoprolol tartrate (LOPRESSOR) 100 MG tablet Take 1 tablet (100 mg total) by mouth once for 1 dose. Please take one time dose 100mg  metoprolol tartrate 2 hr prior to cardiac CT for HR control IF HR >55bpm. 11/16/22 11/16/22  O'Neal, Ronnald Ramp, MD  montelukast (SINGULAIR) 10 MG tablet Take 10 mg by mouth daily. 04/23/21   [provider]  Multiple Vitamins-Minerals (MULTIVITAMIN ADULTS 50+ PO) Take 1 tablet by mouth at bedtime.  12/19/16   [provider]  ondansetron (ZOFRAN-ODT) 4 MG disintegrating tablet Take 1 tablet (4 mg total) by mouth every 8 (eight) hours as needed for nausea or vomiting. 04/25/22   Mumma, Carollee Herter, MD  OZEMPIC, 0.25 OR 0.5 MG/DOSE, 2 MG/3ML SOPN Inject 0.5 mg into the skin once a week. 06/24/21   [provider]  Rimegepant Sulfate (NURTEC) 75 MG TBDP Take 75 mg by mouth daily as needed (take for abortive therapy of migraine, no more than 1 tablet in 24 hours or 10 per month). 08/23/21   Lomax, Amy, NP  SAVELLA 50 MG TABS tablet Take 50 mg by mouth 2 (two) times daily. 08/27/18   [provider]  topiramate (TOPAMAX) 100 MG tablet Take 1 tablet (100 mg total) by mouth 2 (two) times daily. 08/23/21   Lomax, Amy, NP  Travoprost, BAK Free, (TRAVATAN) 0.004 % SOLN ophthalmic solution Place 1 drop into the left eye at bedtime. 01/27/16   [provider]  trimethoprim-polymyxin b (POLYTRIM) ophthalmic solution Place 1 drop into the left eye 2 (two) times daily.  03/30/15   [provider]  VENTOLIN HFA 108 (90 Base) MCG/ACT inhaler Inhale 1-2 puffs into the lungs every 6 (six) hours as needed for wheezing or shortness of breath.   07/10/16   [provider]      Allergies    Bee venom, Bee venom, Amitriptyline, Nortriptyline, Amitriptyline, Norco [hydrocodone-acetaminophen], Nortriptyline, and Zithromax [azithromycin]    Review of Systems   Review of Systems  Physical Exam Updated Vital Signs BP (!) 145/115   Pulse (!) 102   Temp 98.2 F (36.8 C)   Resp 18   SpO2 100%  Physical Exam Constitutional:      General: She is not in acute distress. HENT:     Head: Normocephalic and atraumatic.  Eyes:     Conjunctiva/sclera: Conjunctivae normal.     Pupils: Pupils are equal, round, and reactive to light.  Cardiovascular:     Rate and Rhythm: Normal rate and regular rhythm.  Pulmonary:     Effort: Pulmonary effort is normal. No respiratory distress.  Abdominal:     General: There is no  distension.     Tenderness: There is no abdominal tenderness.  Skin:    General: Skin is warm and dry.  Neurological:     General: No focal deficit present.     Mental Status: She is alert. Mental status is at baseline.  Psychiatric:        Mood and Affect: Mood normal.        Behavior: Behavior normal.     ED Results / Procedures / Treatments   Labs (all labs ordered are listed, but only abnormal results are displayed) Labs Reviewed  BASIC METABOLIC PANEL - Abnormal; Notable for the following components:      Result Value   Glucose, Bld 294 (*)    Creatinine, Ser 1.04 (*)    Calcium 8.8 (*)    All other components within normal limits  CBC - Abnormal; Notable for the following components:   RBC 5.13 (*)    All other components within normal limits  TROPONIN I (HIGH SENSITIVITY)    EKG EKG Interpretation Date/Time:  Tuesday November 22 2022 12:49:19 EDT Ventricular Rate:  112 PR Interval:  166 QRS Duration:  86 QT Interval:  336 QTC Calculation: 458 R Axis:   44  Text Interpretation: Sinus tachycardia When compared with ECG of 27-Sep-2022 17:09, PREVIOUS ECG IS PRESENT NO significant changes  Confirmed by Alvester Chou [phone]) on 11/22/2022 1:41:42 PM  Radiology No results found.  Procedures Procedures  {Document cardiac monitor, telemetry assessment procedure when appropriate:1}  Medications Ordered in ED Medications - No data to display  ED Course/ Medical Decision Making/ A&P   {   Click here for ABCD2, HEART and other calculatorsREFRESH Note before signing :1}                              Medical Decision Making Amount and/or Complexity of Data Reviewed Labs: ordered. Radiology: ordered.  Risk Prescription drug management.   This patient presents to the Emergency Department with complaint of chest pain. This involves an extensive number of treatment options, and is a complaint that carries with it a high risk of complications and morbidity, given the patient's comorbidity, including *** .The differential diagnosis includes ACS vs Pneumothorax vs Reflux/Gastritis vs MSK pain vs Pneumonia vs other.  I felt PE was less likely given that ***  I ordered, reviewed, and interpreted labs.  Pertinent results include *** I ordered medication *** for chest pain I ordered imaging studies which included ***  I independently visualized and interpreted imaging which showed *** and the monitor tracing which showed *** . I agree with the radiologist interpretation Additional history was obtained from *** External records obtained and reviewed showing *** I personally reviewed the patients ECG which showed sinus rhythm with no acute ischemic findings***  After the interventions stated above, I reevaluated the patient and found that they were ***  Based on the patient's clinical exam, vital signs, risk factors, and ED testing, I felt that the patient's overall risk of life-threatening emergency such as ACS, PE, sepsis, or infection was low.  At this time, I felt the patient's presentation was most clinically consistent with ***, but explained to the patient that this evaluation  was not a definitive diagnostic workup.  I discussed outpatient follow up with primary care provider, and provided specialist office number on the patient's discharge paper if a referral was deemed necessary.  Return precautions were discussed with the patient.  I felt  the patient was clinically stable for discharge.   {Document critical care time when appropriate:1} {Document review of labs and clinical decision tools ie heart score, Chads2Vasc2 etc:1}  {Document your independent review of radiology images, and any outside records:1} {Document your discussion with family members, caretakers, and with consultants:1} {Document social determinants of health affecting pt's care:1} {Document your decision making why or why not admission, treatments were needed:1} Final Clinical Impression(s) / ED Diagnoses Final diagnoses:  None    Rx / DC Orders ED Discharge Orders     None

## 2022-11-22 NOTE — ED Notes (Signed)
Cardiology PA at bedside. 
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
## 2022-11-22 NOTE — ED Triage Notes (Signed)
Pt here with reports of central and L sided chest pain onset several days ago, worsening last night. Endorses difficulty breathing and radiation to bil shoulders.

## 2022-11-22 NOTE — Progress Notes (Signed)
   11/22/22 1420  Spiritual Encounters  Type of Visit Initial  Care provided to: Pt and family  Conversation partners present during encounter Nurse  Reason for visit Routine spiritual support  OnCall Visit No   Was stopped by spouse in elevator who asked for prayer. Prayed with spouse who then asked that chaplain come to the ED and see wife. When to ED and found patient and daughter. Provided prayer and spiritual care for patient, spouse and daughter.

## 2022-11-22 NOTE — ED Notes (Signed)
[place] IV needed after two failed attempts... EDRN to try

## 2022-11-22 NOTE — ED Provider Notes (Signed)
  Physical Exam  BP (!) 153/72   Pulse (!) 103   Temp 97.6 F (36.4 C) (Oral)   Resp 17   Ht 5\' 3"  (1.6 m)   Wt 74.8 kg   SpO2 100%   BMI 29.23 kg/m   Physical Exam  Procedures  Procedures  ED Course / MDM   Clinical Course as of 11/22/22 1637  Tue Nov 22, 2022  1430 Cards consult placed - Trish - cards to see [MT]    Clinical Course User Index [MT] Trifan, Kermit Balo, MD   Medical Decision Making Amount and/or Complexity of Data Reviewed Labs: ordered. Radiology: ordered.  Risk Prescription drug management.   Wardell Honour, assumed care for this patient.  In brief 57 year old female with chest pain.  Patient was signed out pending cardiology consultation.  Was contacted by cardiology at 4:35 PM that they are admitting the patient to their service.       Anders Simmonds T, DO 11/22/22 [phone]

## 2022-11-22 NOTE — Progress Notes (Signed)
Med rec reviewed and completed. Did dose reduce PRN lorazepam given dilaudid/benadryl given in ED, adjusted timing of next hydrocodone, and added admin recs to avoid sedating meds if patient is sedated or sleepy. D-dimer also negative/reassuring.

## 2022-11-23 ENCOUNTER — Observation Stay (HOSPITAL_COMMUNITY): Payer: 59

## 2022-11-23 ENCOUNTER — Observation Stay (HOSPITAL_BASED_OUTPATIENT_CLINIC_OR_DEPARTMENT_OTHER): Payer: 59

## 2022-11-23 ENCOUNTER — Encounter (HOSPITAL_COMMUNITY): Payer: Self-pay | Admitting: Cardiovascular Disease

## 2022-11-23 ENCOUNTER — Ambulatory Visit (HOSPITAL_COMMUNITY)
Admission: EM | Disposition: A | Discharge status: Home / Self Care | Payer: Self-pay | Source: Home / Self Care | Attending: Emergency Medicine

## 2022-11-23 ENCOUNTER — Other Ambulatory Visit (HOSPITAL_COMMUNITY): Payer: Self-pay

## 2022-11-23 DIAGNOSIS — E118 Type 2 diabetes mellitus with unspecified complications: Secondary | ICD-10-CM | POA: Diagnosis not present

## 2022-11-23 DIAGNOSIS — M797 Fibromyalgia: Secondary | ICD-10-CM | POA: Diagnosis not present

## 2022-11-23 DIAGNOSIS — N179 Acute kidney failure, unspecified: Secondary | ICD-10-CM | POA: Insufficient documentation

## 2022-11-23 DIAGNOSIS — E1165 Type 2 diabetes mellitus with hyperglycemia: Secondary | ICD-10-CM | POA: Insufficient documentation

## 2022-11-23 DIAGNOSIS — I25119 Atherosclerotic heart disease of native coronary artery with unspecified angina pectoris: Secondary | ICD-10-CM | POA: Diagnosis not present

## 2022-11-23 DIAGNOSIS — R079 Chest pain, unspecified: Secondary | ICD-10-CM | POA: Diagnosis not present

## 2022-11-23 DIAGNOSIS — E782 Mixed hyperlipidemia: Secondary | ICD-10-CM

## 2022-11-23 DIAGNOSIS — I2510 Atherosclerotic heart disease of native coronary artery without angina pectoris: Secondary | ICD-10-CM | POA: Insufficient documentation

## 2022-11-23 DIAGNOSIS — J449 Chronic obstructive pulmonary disease, unspecified: Secondary | ICD-10-CM | POA: Diagnosis not present

## 2022-11-23 DIAGNOSIS — E1122 Type 2 diabetes mellitus with diabetic chronic kidney disease: Secondary | ICD-10-CM | POA: Diagnosis not present

## 2022-11-23 HISTORY — PX: LEFT HEART CATH AND CORONARY ANGIOGRAPHY: CATH118249

## 2022-11-23 LAB — ECHOCARDIOGRAM COMPLETE
Area-P 1/2: 3.65 cm2
Calc EF: 54.6 %
Height: 63 in
S' Lateral: 2.7 cm
Single Plane A2C EF: 55.2 %
Single Plane A4C EF: 54.4 %
Weight: 2640 [oz_av]

## 2022-11-23 LAB — BASIC METABOLIC PANEL
Anion gap: 7 (ref 5–15)
BUN: 13 mg/dL (ref 6–20)
CO2: 27 mmol/L (ref 22–32)
Calcium: 8.8 mg/dL — ABNORMAL LOW (ref 8.9–10.3)
Chloride: 105 mmol/L (ref 98–111)
Creatinine, Ser: 0.96 mg/dL (ref 0.44–1.00)
GFR, Estimated: >60 mL/min (ref >60)
Glucose, Bld: 257 mg/dL — ABNORMAL HIGH (ref 70–99)
Potassium: 3.9 mmol/L (ref 3.5–5.1)
Sodium: 139 mmol/L (ref 135–145)

## 2022-11-23 LAB — LIPID PANEL
Cholesterol: 100 mg/dL (ref 0–200)
HDL: 32 mg/dL — ABNORMAL LOW (ref >40)
Total CHOL/HDL Ratio: 3.1 {ratio}
Triglycerides: 350 mg/dL — ABNORMAL HIGH (ref <150)
VLDL: 70 mg/dL — ABNORMAL HIGH (ref 0–40)

## 2022-11-23 LAB — CBC
HCT: 40.5 % (ref 36.0–46.0)
Hemoglobin: 13.1 g/dL (ref 12.0–15.0)
MCH: 27.3 pg (ref 26.0–34.0)
MCHC: 32.3 g/dL (ref 30.0–36.0)
MCV: 84.6 fL (ref 80.0–100.0)
Platelets: 208 10*3/uL (ref 150–400)
RBC: 4.79 MIL/uL (ref 3.87–5.11)
RDW: 13.3 % (ref 11.5–15.5)
WBC: 5.9 10*3/uL (ref 4.0–10.5)
nRBC: 0 % (ref 0.0–0.2)

## 2022-11-23 LAB — GLUCOSE, CAPILLARY
Glucose-Capillary: 274 mg/dL — ABNORMAL HIGH (ref 70–99)
Glucose-Capillary: 254 mg/dL — ABNORMAL HIGH (ref 70–99)
Glucose-Capillary: 181 mg/dL — ABNORMAL HIGH (ref 70–99)
Glucose-Capillary: 156 mg/dL — ABNORMAL HIGH (ref 70–99)

## 2022-11-23 LAB — BRAIN NATRIURETIC PEPTIDE: B Natriuretic Peptide: 14.5 pg/mL (ref 0.0–100.0)

## 2022-11-23 SURGERY — LEFT HEART CATH AND CORONARY ANGIOGRAPHY
Anesthesia: LOCAL

## 2022-11-23 MED ORDER — FENTANYL CITRATE (PF) 100 MCG/2ML IJ SOLN
INTRAMUSCULAR | Status: AC
  Filled 2022-11-23: qty 2

## 2022-11-23 MED ORDER — HEPARIN (PORCINE) IN NACL 1000-0.9 UT/500ML-% IV SOLN
INTRAVENOUS | Status: DC | PRN
  Administered 2022-11-23 (×2): 500 mL

## 2022-11-23 MED ORDER — MIDAZOLAM HCL 2 MG/2ML IJ SOLN
INTRAMUSCULAR | Status: DC | PRN
  Administered 2022-11-23: 1 mg via INTRAVENOUS

## 2022-11-23 MED ORDER — METOPROLOL TARTRATE 50 MG PO TABS: 50.0000 mg | ORAL_TABLET | Freq: Two times a day (BID) | ORAL | 1 refills | Status: DC

## 2022-11-23 MED ORDER — HEPARIN SODIUM (PORCINE) 1000 UNIT/ML IJ SOLN
INTRAMUSCULAR | Status: AC
  Filled 2022-11-23: qty 10

## 2022-11-23 MED ORDER — LIDOCAINE HCL (PF) 1 % IJ SOLN
INTRAMUSCULAR | Status: AC
  Filled 2022-11-23: qty 30

## 2022-11-23 MED ORDER — METOPROLOL TARTRATE 50 MG PO TABS: 50.0000 mg | ORAL_TABLET | Freq: Two times a day (BID) | ORAL | Status: DC

## 2022-11-23 MED ORDER — VERAPAMIL HCL 2.5 MG/ML IV SOLN
INTRAVENOUS | Status: AC
  Filled 2022-11-23: qty 2

## 2022-11-23 MED ORDER — ICOSAPENT ETHYL 1 G PO CAPS: 2.0000 g | ORAL_CAPSULE | Freq: Two times a day (BID) | ORAL | 1 refills | Status: AC

## 2022-11-23 MED ORDER — VERAPAMIL HCL 2.5 MG/ML IV SOLN
INTRAVENOUS | Status: DC | PRN
  Administered 2022-11-23: 10 mL via INTRA_ARTERIAL

## 2022-11-23 MED ORDER — SODIUM CHLORIDE 0.9 % IV SOLN: 250.0000 mL | INTRAVENOUS | Status: DC | PRN

## 2022-11-23 MED ORDER — FENTANYL CITRATE (PF) 100 MCG/2ML IJ SOLN
INTRAMUSCULAR | Status: DC | PRN
  Administered 2022-11-23: 25 ug via INTRAVENOUS

## 2022-11-23 MED ORDER — HEPARIN SODIUM (PORCINE) 1000 UNIT/ML IJ SOLN
INTRAMUSCULAR | Status: DC | PRN
  Administered 2022-11-23: 4000 [IU] via INTRAVENOUS

## 2022-11-23 MED ORDER — IOHEXOL 350 MG/ML SOLN
INTRAVENOUS | Status: DC | PRN
  Administered 2022-11-23: 37 mL

## 2022-11-23 MED ORDER — MIDAZOLAM HCL 2 MG/2ML IJ SOLN
INTRAMUSCULAR | Status: AC
  Filled 2022-11-23: qty 2

## 2022-11-23 MED ORDER — ICOSAPENT ETHYL 1 G PO CAPS
2.0000 g | ORAL_CAPSULE | Freq: Two times a day (BID) | ORAL | Status: DC
  Administered 2022-11-23: 2 g via ORAL
  Filled 2022-11-23 (×2): qty 2

## 2022-11-23 SURGICAL SUPPLY — 8 items
CATH INFINITI AMBI 5FR TG (CATHETERS) IMPLANT
DEVICE RAD COMP TR BAND LRG (VASCULAR PRODUCTS) IMPLANT
GLIDESHEATH SLEND SS 6F .021 (SHEATH) IMPLANT
GUIDEWIRE INQWIRE 1.5J.035X260 (WIRE) IMPLANT
INQWIRE 1.5J .035X260CM (WIRE) ×1
PACK CARDIAC CATHETERIZATION (CUSTOM PROCEDURE TRAY) ×1 IMPLANT
SET ATX-X65L (MISCELLANEOUS) IMPLANT
SHEATH PROBE COVER 6X72 (BAG) IMPLANT
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
## 2022-11-23 NOTE — Progress Notes (Signed)
Echocardiogram 2D Echocardiogram has been performed.  Augustine Radar 11/23/2022, 3:41 PM

## 2022-11-23 NOTE — Plan of Care (Signed)
  Problem: Education: Goal: Knowledge of General Education information will improve Description: Including pain rating scale, medication(s)/side effects and non-pharmacologic comfort measures Outcome: Adequate for Discharge   Problem: Health Behavior/Discharge Planning: Goal: Ability to manage health-related needs will improve Outcome: Adequate for Discharge   Problem: Clinical Measurements: Goal: Ability to maintain clinical measurements within normal limits will improve Outcome: Adequate for Discharge Goal: Will remain free from infection Outcome: Adequate for Discharge Goal: Diagnostic test results will improve Outcome: Adequate for Discharge Goal: Respiratory complications will improve Outcome: Adequate for Discharge Goal: Cardiovascular complication will be avoided Outcome: Adequate for Discharge   Problem: Activity: Goal: Risk for activity intolerance will decrease Outcome: Adequate for Discharge   Problem: Nutrition: Goal: Adequate nutrition will be maintained Outcome: Adequate for Discharge   Problem: Coping: Goal: Level of anxiety will decrease Outcome: Adequate for Discharge   Problem: Elimination: Goal: Will not experience complications related to bowel motility Outcome: Adequate for Discharge Goal: Will not experience complications related to urinary retention Outcome: Adequate for Discharge   Problem: Pain Management: Goal: General experience of comfort will improve Outcome: Adequate for Discharge   Problem: Safety: Goal: Ability to remain free from injury will improve Outcome: Adequate for Discharge   Problem: Skin Integrity: Goal: Risk for impaired skin integrity will decrease Outcome: Adequate for Discharge   Problem: Education: Goal: Understanding of cardiac disease, CV risk reduction, and recovery process will improve Outcome: Adequate for Discharge Goal: Individualized Educational Video(s) Outcome: Adequate for Discharge   Problem:  Activity: Goal: Ability to tolerate increased activity will improve Outcome: Adequate for Discharge   Problem: Cardiac: Goal: Ability to achieve and maintain adequate cardiovascular perfusion will improve Outcome: Adequate for Discharge   Problem: Health Behavior/Discharge Planning: Goal: Ability to safely manage health-related needs after discharge will improve Outcome: Adequate for Discharge   Problem: Education: Goal: Understanding of CV disease, CV risk reduction, and recovery process will improve Outcome: Adequate for Discharge Goal: Individualized Educational Video(s) Outcome: Adequate for Discharge   Problem: Activity: Goal: Ability to return to baseline activity level will improve Outcome: Adequate for Discharge   Problem: Cardiovascular: Goal: Ability to achieve and maintain adequate cardiovascular perfusion will improve Outcome: Adequate for Discharge Goal: Vascular access site(s) Level 0-1 will be maintained Outcome: Adequate for Discharge   Problem: Health Behavior/Discharge Planning: Goal: Ability to safely manage health-related needs after discharge will improve Outcome: Adequate for Discharge

## 2022-11-23 NOTE — Interval H&P Note (Signed)
History and Physical Interval Note:  11/23/2022 1:02 PM  Annette Norman  has presented today for surgery, with the diagnosis of chest pain.  The various methods of treatment have been discussed with the patient and family. After consideration of risks, benefits and other options for treatment, the patient has consented to  Procedure(s): LEFT HEART CATH AND CORONARY ANGIOGRAPHY (N/A) as a surgical intervention.  The patient's history has been reviewed, patient examined, no change in status, stable for surgery.  I have reviewed the patient's chart and labs.  Questions were answered to the patient's satisfaction.     Lorine Bears
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
## 2022-11-23 NOTE — TOC Benefit Eligibility Note (Signed)
Patient Product/process development scientist completed.    The patient is insured through [facility]. Patient has Medicare and is not eligible for a copay card, but may be able to apply for patient assistance, if available.    Ran test claim for Vascepa 1 g capsule and the current 30 day co-pay is $0.00.   This test claim was processed through [facility]- copay amounts may vary at other pharmacies due to pharmacy/plan contracts, or as the patient moves through the different stages of their insurance plan.     Roland Earl, CPHT Pharmacy Technician III Certified Patient Advocate [facility] Pharmacy Patient Advocate Team Direct Number: [phone]  Fax: [phone]

## 2022-11-23 NOTE — TOC Initial Note (Signed)
Transition of Care [facility]) - Initial/Assessment Note    Patient Details  Name: Annette Norman MRN: [id number] Date of Birth: 12-30-1965  [facility]) CM/SW Contact:    Leone Haven, RN Phone Number: 11/23/2022, 10:48 AM  Clinical Narrative:                 From home with spouse, has PCP and insurance on file, states has no HH services in place at this time or DME at home.  States spouse will transport her home at [employer] and he is support system, states gets medications from [facility] on [address], in [place].  Pta self ambulatory.   Expected Discharge Plan: Home/Self Care Barriers to Discharge: Continued Medical Work up   Patient Goals and CMS Choice Patient states their goals for this hospitalization and ongoing recovery are:: return home   Choice offered to / list presented to : NA      Expected Discharge Plan and Services In-house Referral: NA Discharge Planning Services: CM Consult Post Acute Care Choice: NA Living arrangements for the past 2 months: Single Family Home                 DME Arranged: N/A         HH Arranged: NA          Prior Living Arrangements/Services Living arrangements for the past 2 months: Single Family Home Lives with:: Spouse Patient language and need for interpreter reviewed:: Yes Do you feel safe going back to the place where you live?: Yes      Need for Family Participation in Patient Care: Yes (Comment) Care giver support system in place?: Yes (comment)   Criminal Activity/Legal Involvement Pertinent to Current Situation/Hospitalization: No - Comment as needed  Activities of Daily Living   ADL Screening (condition at time of admission) Independently performs ADLs?: Yes (appropriate for developmental age) Is the patient deaf or have difficulty hearing?: No Does the patient have difficulty seeing, even when wearing glasses/contacts?: Yes (blind in left eye) Does the patient have difficulty concentrating,  remembering, or making decisions?: No  Permission Sought/Granted Permission sought to share information with : Case Manager Permission granted to share information with : Yes, Verbal Permission Granted              Emotional Assessment Appearance:: Appears stated age Attitude/Demeanor/Rapport: Engaged Affect (typically observed): Appropriate Orientation: : Oriented to Self, Oriented to Place, Oriented to  Time, Oriented to Situation   Psych Involvement: No (comment)  Admission diagnosis:  Chest pain [R07.9] Patient Active Problem List   Diagnosis Date Noted   Stroke (HCC) 06/01/2021   Chronic pain syndrome 11/07/2018   Pain in left hip 12/27/2017   Common migraine 10/10/2017   Neck pain 10/10/2017   Insomnia 10/10/2017   Obstructive sleep apnea 10/10/2017   Numbness on left side 09/17/2016   COPD (chronic obstructive pulmonary disease) (HCC) 09/16/2016   Leg pain, right 03/15/2016   Chronic bilateral low back pain without sciatica 12/10/2015   Uncontrolled type 2 diabetes mellitus with diabetic autonomic neuropathy, without long-term current use of insulin    Esophageal reflux    Depression    Benign essential HTN    Chest pain 02/04/2015   HLD (hyperlipidemia) 02/04/2015   Atypical chest pain    PCP:  Maye Hides, PA Pharmacy:   [facility] [address] [place] - [address] [place] [address] Phone: [phone] Fax: [phone]  Social Determinants of Health (SDOH) Social History: SDOH Screenings   Food Insecurity: No Food Insecurity (11/22/2022)  Housing: Low Risk  (11/22/2022)  Transportation Needs: No Transportation Needs (11/22/2022)  Utilities: Not At Risk (11/22/2022)  Tobacco Use: Low Risk  (11/22/2022)  Recent Concern: Tobacco Use - Medium Risk (11/03/2022)   Received from [facility]   SDOH Interventions:     Readmission Risk Interventions     No data to display

## 2022-11-23 NOTE — Plan of Care (Signed)
  Problem: Education: Goal: Knowledge of General Education information will improve Description: Including pain rating scale, medication(s)/side effects and non-pharmacologic comfort measures Outcome: Progressing   Problem: Health Behavior/Discharge Planning: Goal: Ability to manage health-related needs will improve Outcome: Progressing   Problem: Clinical Measurements: Goal: Ability to maintain clinical measurements within normal limits will improve Outcome: Progressing Goal: Will remain free from infection Outcome: Progressing Goal: Diagnostic test results will improve Outcome: Progressing Goal: Respiratory complications will improve Outcome: Progressing Goal: Cardiovascular complication will be avoided Outcome: Progressing   Problem: Activity: Goal: Risk for activity intolerance will decrease Outcome: Progressing   Problem: Nutrition: Goal: Adequate nutrition will be maintained Outcome: Progressing   Problem: Coping: Goal: Level of anxiety will decrease Outcome: Progressing   Problem: Elimination: Goal: Will not experience complications related to bowel motility Outcome: Progressing Goal: Will not experience complications related to urinary retention Outcome: Progressing   Problem: Pain Management: Goal: General experience of comfort will improve Outcome: Progressing   Problem: Safety: Goal: Ability to remain free from injury will improve Outcome: Progressing   Problem: Skin Integrity: Goal: Risk for impaired skin integrity will decrease Outcome: Progressing   Problem: Education: Goal: Understanding of cardiac disease, CV risk reduction, and recovery process will improve Outcome: Progressing Goal: Individualized Educational Video(s) Outcome: Progressing   Problem: Activity: Goal: Ability to tolerate increased activity will improve Outcome: Progressing   Problem: Cardiac: Goal: Ability to achieve and maintain adequate cardiovascular perfusion will  improve Outcome: Progressing   Problem: Health Behavior/Discharge Planning: Goal: Ability to safely manage health-related needs after discharge will improve Outcome: Progressing   Problem: Education: Goal: Understanding of CV disease, CV risk reduction, and recovery process will improve Outcome: Progressing Goal: Individualized Educational Video(s) Outcome: Progressing   Problem: Activity: Goal: Ability to return to baseline activity level will improve Outcome: Progressing   Problem: Cardiovascular: Goal: Ability to achieve and maintain adequate cardiovascular perfusion will improve Outcome: Progressing Goal: Vascular access site(s) Level 0-1 will be maintained Outcome: Progressing   Problem: Health Behavior/Discharge Planning: Goal: Ability to safely manage health-related needs after discharge will improve Outcome: Progressing

## 2022-11-23 NOTE — Inpatient Diabetes Management (Signed)
Inpatient Diabetes Program Recommendations  AACE/ADA: New Consensus Statement on Inpatient Glycemic Control (2015)  Target Ranges:  Prepandial:   less than 140 mg/dL      Peak postprandial:   less than 180 mg/dL (1-2 hours)      Critically ill patients:  140 - 180 mg/dL   Lab Results  Component Value Date   GLUCAP 156 (H) 11/23/2022   HGBA1C 9.4 (H) 11/22/2022    Review of Glycemic Control  Latest Reference Range & Units 11/23/22 04:22 11/23/22 07:21 11/23/22 11:11 11/23/22 13:53  Glucose-Capillary 70 - 99 mg/dL 161 (H) 096 (H) 045 (H) 156 (H)  (H): Data is abnormally high Admit with: CP   Home DM Meds: Insulin Pump   Current Orders: Insulin Pump Q4 hours    ENDO: [facility] Seen 11/18/2022 Omni Pod Dash Insulin Pump Looks like Basal rate was increased to 1.35 units/hr Total Basal rate= 32.4 units Carb Ratio 1:10 Correction Factor 1:40 CBG goal 130    Inpatient Diabetes Program Recommendations:    Spoke with patient and husband regarding outpatient diabetes management. Verified settings in Omnipod. Patient reports trends don't usually run as high as they have in the last few hours. Patient anticipates trends to continue to decrease. Current CBG 154 mg/dL, however has been NPO. Noted patient bolusing appropriately. Has an upcoming endocrinology appointment and settings were recently increased.  Reviewed patient's current A1c of 9.4%. Explained what a A1c is and what it measures. Also reviewed goal A1c with patient, importance of good glucose control @ home, and blood sugar goals. Thanks, Lujean Rave, MSN, RNC-OB Diabetes Coordinator [phone] (8a-5p)
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
## 2022-11-24 MED FILL — Lidocaine HCl Local Preservative Free (PF) Inj 1%: INTRAMUSCULAR | Qty: 30 | Status: AC

## 2022-11-29 LAB — LIPOPROTEIN A (LPA): Lipoprotein (a): 70.1 nmol/L — ABNORMAL HIGH (ref <75.0)

## 2023-01-19 ENCOUNTER — Other Ambulatory Visit: Payer: Self-pay | Admitting: Cardiovascular Disease

## 2023-01-19 DIAGNOSIS — R079 Chest pain, unspecified: Secondary | ICD-10-CM

## 2023-02-21 ENCOUNTER — Other Ambulatory Visit: Payer: Self-pay | Admitting: Cardiovascular Disease

## 2023-02-21 DIAGNOSIS — R079 Chest pain, unspecified: Secondary | ICD-10-CM

## 2023-03-03 ENCOUNTER — Other Ambulatory Visit: Payer: Self-pay | Admitting: Cardiovascular Disease

## 2023-03-03 DIAGNOSIS — R079 Chest pain, unspecified: Secondary | ICD-10-CM

## 2023-04-11 ENCOUNTER — Emergency Department
Admission: EM | Admit: 2023-04-11 | Discharge: 2023-04-11 | Disposition: A | Discharge status: Home / Self Care | Attending: Emergency Medicine | Admitting: Emergency Medicine

## 2023-04-11 ENCOUNTER — Encounter: Payer: Self-pay | Admitting: Emergency Medicine

## 2023-04-11 ENCOUNTER — Other Ambulatory Visit: Payer: Self-pay

## 2023-04-11 DIAGNOSIS — I10 Essential (primary) hypertension: Secondary | ICD-10-CM | POA: Diagnosis not present

## 2023-04-11 DIAGNOSIS — E1165 Type 2 diabetes mellitus with hyperglycemia: Secondary | ICD-10-CM | POA: Insufficient documentation

## 2023-04-11 DIAGNOSIS — R739 Hyperglycemia, unspecified: Secondary | ICD-10-CM | POA: Diagnosis present

## 2023-04-11 DIAGNOSIS — J449 Chronic obstructive pulmonary disease, unspecified: Secondary | ICD-10-CM | POA: Insufficient documentation

## 2023-04-11 LAB — CBG MONITORING, ED
Glucose-Capillary: 338 mg/dL — ABNORMAL HIGH (ref 70–99)
Glucose-Capillary: 207 mg/dL — ABNORMAL HIGH (ref 70–99)

## 2023-04-11 LAB — URINALYSIS, ROUTINE W REFLEX MICROSCOPIC
Bilirubin Urine: NEGATIVE
Glucose, UA: >=500 mg/dL — AB
Hgb urine dipstick: NEGATIVE
Ketones, ur: NEGATIVE mg/dL
Leukocytes,Ua: NEGATIVE
Nitrite: NEGATIVE
Protein, ur: NEGATIVE mg/dL
Specific Gravity, Urine: 1.029 (ref 1.005–1.030)
pH: 5 (ref 5.0–8.0)

## 2023-04-11 LAB — CBC
HCT: 44.2 % (ref 36.0–46.0)
Hemoglobin: 14 g/dL (ref 12.0–15.0)
MCH: 26.6 pg (ref 26.0–34.0)
MCHC: 31.7 g/dL (ref 30.0–36.0)
MCV: 83.9 fL (ref 80.0–100.0)
Platelets: 248 10*3/uL (ref 150–400)
RBC: 5.27 MIL/uL — ABNORMAL HIGH (ref 3.87–5.11)
RDW: 13.5 % (ref 11.5–15.5)
WBC: 6.7 10*3/uL (ref 4.0–10.5)
nRBC: 0 % (ref 0.0–0.2)

## 2023-04-11 LAB — BASIC METABOLIC PANEL WITH GFR
Anion gap: 9 (ref 5–15)
BUN: 11 mg/dL (ref 6–20)
CO2: 24 mmol/L (ref 22–32)
Calcium: 9.4 mg/dL (ref 8.9–10.3)
Chloride: 102 mmol/L (ref 98–111)
Creatinine, Ser: 0.91 mg/dL (ref 0.44–1.00)
GFR, Estimated: >60 mL/min
Glucose, Bld: 345 mg/dL — ABNORMAL HIGH (ref 70–99)
Potassium: 4.1 mmol/L (ref 3.5–5.1)
Sodium: 135 mmol/L (ref 135–145)

## 2023-04-11 MED ORDER — SODIUM CHLORIDE 0.9 % IV BOLUS
1000.0000 mL | Freq: Once | INTRAVENOUS | Status: AC
  Administered 2023-04-11: 1000 mL via INTRAVENOUS

## 2023-04-11 MED ORDER — INSULIN ASPART 100 UNIT/ML IJ SOLN
5.0000 [IU] | Freq: Once | INTRAMUSCULAR | Status: AC
  Administered 2023-04-11: 5 [IU] via INTRAVENOUS
  Filled 2023-04-11: qty 1

## 2023-04-11 NOTE — ED Notes (Signed)
CBG 207 

## 2023-04-11 NOTE — ED Provider Notes (Signed)
 [facility] Provider Note    Event Date/Time   First MD Initiated Contact with Patient 04/11/23 1713     (approximate)   History   Chief Complaint Hyperglycemia   HPI  Annette Norman is a 58 y.o. female with past medical history of hypertension, hyperlipidemia, diabetes, stroke, and COPD who presents to the ED complaining of hyperglycemia.  Reports that over the past 24 hours she her blood sugar has been running in the 300s despite continuing to use her insulin pump as usual.  She reports feeling generally weak and malaised, has been dealing with a chronic cough but denies any fevers, chest pain, or shortness of breath.  She has not had any abdominal pain, nausea, vomiting, diarrhea, or dysuria.     Physical Exam   Triage Vital Signs: ED Triage Vitals  Encounter Vitals Group     BP 04/11/23 1440 122/72     Systolic BP Percentile --      Diastolic BP Percentile --      Pulse Rate 04/11/23 1440 (!) 110     Resp 04/11/23 1440 20     Temp 04/11/23 1444 98.9 F (37.2 C)     Temp Source 04/11/23 1444 Oral     SpO2 04/11/23 1440 100 %     Weight 04/11/23 1426 170 lb (77.1 kg)     Height 04/11/23 1426 5\' 3"  (1.6 m)     Head Circumference --      Peak Flow --      Pain Score 04/11/23 1426 9     Pain Loc --      Pain Education --      Exclude from Growth Chart --     Most recent vital signs: Vitals:   04/11/23 1444 04/11/23 1907  BP:  124/71  Pulse:  96  Resp:  18  Temp: 98.9 F (37.2 C) 98.3 F (36.8 C)  SpO2:  100%    Constitutional: Alert and oriented. Eyes: Conjunctivae are normal. Head: Atraumatic. Nose: No congestion/rhinnorhea. Mouth/Throat: Mucous membranes are moist.  Cardiovascular: Normal rate, regular rhythm. Grossly normal heart sounds.  2+ radial pulses bilaterally. Respiratory: Normal respiratory effort.  No retractions. Lungs CTAB. Gastrointestinal: Soft and nontender. No distention. Musculoskeletal: No lower extremity  tenderness nor edema.  Neurologic:  Normal speech and language. No gross focal neurologic deficits are appreciated.    ED Results / Procedures / Treatments   Labs (all labs ordered are listed, but only abnormal results are displayed) Labs Reviewed  BASIC METABOLIC PANEL - Abnormal; Notable for the following components:      Result Value   Glucose, Bld 345 (*)    All other components within normal limits  CBC - Abnormal; Notable for the following components:   RBC 5.27 (*)    All other components within normal limits  URINALYSIS, ROUTINE W REFLEX MICROSCOPIC - Abnormal; Notable for the following components:   Color, Urine STRAW (*)    APPearance CLEAR (*)    Glucose, UA >=500 (*)    Bacteria, UA RARE (*)    All other components within normal limits  CBG MONITORING, ED - Abnormal; Notable for the following components:   Glucose-Capillary 338 (*)    All other components within normal limits  CBG MONITORING, ED - Abnormal; Notable for the following components:   Glucose-Capillary 207 (*)    All other components within normal limits     EKG  ED ECG REPORT I, Chesley Noon, the  attending physician, personally viewed and interpreted this ECG.   Date: 04/11/2023  EKG Time: 14:35  Rate: 107  Rhythm: sinus tachycardia  Axis: Normal  Intervals:none  ST&T Change: None  PROCEDURES:  Critical Care performed: No  Procedures   MEDICATIONS ORDERED IN ED: Medications  sodium chloride 0.9 % bolus 1,000 mL (0 mLs Intravenous Stopped 04/11/23 1912)  insulin aspart (novoLOG) injection 5 Units (5 Units Intravenous Given 04/11/23 1758)     IMPRESSION / MDM / ASSESSMENT AND PLAN / ED COURSE  I reviewed the triage vital signs and the nursing notes.                              58 y.o. female with past medical history of hypertension, hyperlipidemia, diabetes, stroke, and COPD who presents to the ED complaining of malaise with generalized weakness and hyperglycemia over the past  24 hours.  Patient's presentation is most consistent with acute presentation with potential threat to life or bodily function.  Differential diagnosis includes, but is not limited to, hyperglycemia, DKA, HHS, dehydration, electrolyte abnormality, AKI, UTI.  Patient nontoxic-appearing and in no acute distress, vital signs are remarkable for mild tachycardia but otherwise reassuring.  EKG shows no evidence arrhythmia or ischemia, labs show hyperglycemia but no evidence of DKA.  No significant anemia, leukocytosis, electrolyte abnormality, or AKI noted.  Urinalysis shows no signs of infection.  Suspect patient is dehydrated due to hyperglycemia, will give IV fluid bolus and IV insulin.  Blood glucose improved on recheck and patient reports feeling better following IV fluid bolus.  She is appropriate for discharge home with outpatient PCP or endocrinology follow-up, was counseled to return to the ED for new or worsening symptoms.  Patient agrees with plan.      FINAL CLINICAL IMPRESSION(S) / ED DIAGNOSES   Final diagnoses:  Hyperglycemia     Rx / DC Orders   ED Discharge Orders     None        Note:  This document was prepared using Dragon voice recognition software and may include unintentional dictation errors.   Chesley Noon, MD 04/11/23 [phone]

## 2023-04-11 NOTE — ED Triage Notes (Signed)
 Pt states blood sugar has been over 300 all day and can not get it to come down with insulin. Pt states body feels heavy. Increased thirst and urination.

## 2023-04-11 NOTE — ED Notes (Signed)
 See triage note  Presents with elevated b/s  States she has felt like it was elevated all   states she has not missed her medication Denies any n/v or fever

## 2023-04-26 ENCOUNTER — Other Ambulatory Visit: Payer: Self-pay

## 2023-04-26 ENCOUNTER — Emergency Department
Admission: EM | Admit: 2023-04-26 | Discharge: 2023-04-26 | Disposition: A | Discharge status: Home / Self Care | Attending: Emergency Medicine | Admitting: Emergency Medicine

## 2023-04-26 ENCOUNTER — Emergency Department

## 2023-04-26 DIAGNOSIS — J449 Chronic obstructive pulmonary disease, unspecified: Secondary | ICD-10-CM | POA: Diagnosis not present

## 2023-04-26 DIAGNOSIS — R053 Chronic cough: Secondary | ICD-10-CM | POA: Diagnosis not present

## 2023-04-26 DIAGNOSIS — E119 Type 2 diabetes mellitus without complications: Secondary | ICD-10-CM | POA: Diagnosis not present

## 2023-04-26 DIAGNOSIS — R059 Cough, unspecified: Secondary | ICD-10-CM | POA: Diagnosis present

## 2023-04-26 LAB — RESP PANEL BY RT-PCR (RSV, FLU A&B, COVID)  RVPGX2
Influenza A by PCR: NEGATIVE
Influenza B by PCR: NEGATIVE
Resp Syncytial Virus by PCR: NEGATIVE
SARS Coronavirus 2 by RT PCR: NEGATIVE

## 2023-04-26 MED ORDER — PSEUDOEPH-BROMPHEN-DM 30-2-10 MG/5ML PO SYRP
2.5000 mL | ORAL_SOLUTION | Freq: Four times a day (QID) | ORAL | 0 refills | Status: AC | PRN
Start: 2023-04-26 — End: ?

## 2023-04-26 MED ORDER — KETOROLAC TROMETHAMINE 30 MG/ML IJ SOLN
30.0000 mg | Freq: Once | INTRAMUSCULAR | Status: AC
  Administered 2023-04-26: 30 mg via INTRAMUSCULAR
  Filled 2023-04-26: qty 1

## 2023-04-26 NOTE — Discharge Instructions (Addendum)
 You were evaluated in the ED for a cough.  Your respiratory panel is reassuring.  Your chest x-ray is normal.  No indications of pneumonia. Follow up with your PCP

## 2023-04-26 NOTE — ED Notes (Signed)
 Patient ambulatory to bathroom.

## 2023-04-26 NOTE — ED Provider Notes (Signed)
 [facility] Emergency Department Provider Note     Event Date/Time   First MD Initiated Contact with Patient 04/26/23 1350     (approximate)   History   Cough   HPI  Annette Norman is a 58 y.o. female with a history of diabetes, fibromyalgia and COPD presents to the ED for evaluation of a cough x 2 months.  The cough is described as nonproductive.  She reports it is worse at night.  She denies sick contacts.  She reports she does work in a [employer]. She states she has tried many OTC cough medicines and was just seen at her PCP on Monday (2 days ago) where she was administered a rocephin and steroid injection which did help some. She denies shortness of breath, chest pain or tobacco use.      Physical Exam   Triage Vital Signs: ED Triage Vitals  Encounter Vitals Group     BP 04/26/23 1332 138/82     Systolic BP Percentile --      Diastolic BP Percentile --      Pulse Rate 04/26/23 1332 (!) 110     Resp 04/26/23 1332 18     Temp 04/26/23 1332 98.5 F (36.9 C)     Temp Source 04/26/23 1332 Oral     SpO2 04/26/23 1332 100 %     Weight 04/26/23 1333 169 lb 12.1 oz (77 kg)     Height 04/26/23 1333 5\' 3"  (1.6 m)     Head Circumference --      Peak Flow --      Pain Score 04/26/23 1333 0     Pain Loc --      Pain Education --      Exclude from Growth Chart --     Most recent vital signs: Vitals:   04/26/23 1332  BP: 138/82  Pulse: (!) 110  Resp: 18  Temp: 98.5 F (36.9 C)  SpO2: 100%    General: Alert and oriented. INAD. Non toxic appearing.    Head:  NCAT.  Eyes:  PERRLA. EOMI.  Throat: Oropharynx clear. No erythema or exudates. Tonsils not enlarged. Uvula is midline. Neck:   Full ROM without difficulty.  CV:  Good peripheral perfusion. RRR.  RESP:  Normal effort. LCTAB. No retractions.  ABD:  No distention.   ED Results / Procedures / Treatments   Labs (all labs ordered are listed, but only abnormal results are  displayed) Labs Reviewed  RESP PANEL BY RT-PCR (RSV, FLU A&B, COVID)  [id number]   RADIOLOGY  I personally viewed and evaluated these images as part of my medical decision making, as well as reviewing the written report by the radiologist.  ED Provider Interpretation: No focal consolidation   DG Chest 2 View Result Date: 04/26/2023 CLINICAL DATA:  Cough. EXAM: CHEST - 2 VIEW COMPARISON:  November 22, 2022. FINDINGS: The heart size and mediastinal contours are within normal limits. Both lungs are clear. The visualized skeletal structures are unremarkable. IMPRESSION: No active cardiopulmonary disease. Electronically Signed   By: Lupita Raider M.D.   On: 04/26/2023 15:59   PROCEDURES:  Critical Care performed: No  Procedures  MEDICATIONS ORDERED IN ED: Medications  ketorolac (TORADOL) 30 MG/ML injection 30 mg (30 mg Intramuscular Given 04/26/23 1513)    IMPRESSION / MDM / ASSESSMENT AND PLAN / ED COURSE  I reviewed the triage vital signs and the nursing notes.  Clinical Course as of 04/26/23 1656  Wed Apr 26, 2023  1635 Pulse taken by myself 96bpm [MH]    Clinical Course User Index [MH] Kern Reap A, PA-C    58 y.o. female presents to the emergency department for evaluation and treatment of chronic cough. See HPI for further details.   Differential diagnosis includes, but is not limited to viral URI, PNA, bronchitis  Patient's presentation is most consistent with acute complicated illness / injury requiring diagnostic workup.  Patient is alert and oriented.  She is hemodynamically stable.  She is initially tachy at 110 however this is improved throughout the duration of her ED encounter.  Toradol IM given as patient reports pain with coughing.  Respiratory panel is reassuring.  Chest x-ray is reassuring.  No signs of pneumonia.  Patient has a history of COPD in which we discussed compliance with medication in which she states she takes her medication  every day as instructed.  Patient is in stable condition for discharge home.  Encouraged to follow-up with PCP for further management.  ED return precaution discussed.   FINAL CLINICAL IMPRESSION(S) / ED DIAGNOSES   Final diagnoses:  Chronic cough    Rx / DC Orders   ED Discharge Orders          Ordered    brompheniramine-pseudoephedrine-DM 30-2-10 MG/5ML syrup  4 times daily PRN        04/26/23 1629             Note:  This document was prepared using Dragon voice recognition software and may include unintentional dictation errors.    Romeo Apple, Jerome Viglione A, PA-C 04/26/23 1656    Delton Prairie, MD 04/27/23 [phone]

## 2023-04-26 NOTE — ED Notes (Signed)
 Patient reports pain 8/10 in her throat, chest and shoulders when she coughs. Pt states that she has had this cough since valentines day, and that it just won't go away. Pt alert and oriented x4, with no signs of acute distress at this time.

## 2023-04-26 NOTE — ED Triage Notes (Signed)
 Pt states non-productive cough for 1 month. Pt states recently swabbed for COVID/Flu/RSV and states it was negative. Pt denies fevers. NAD noted.

## 2023-07-18 ENCOUNTER — Other Ambulatory Visit: Payer: Self-pay | Admitting: Physician Assistant

## 2023-07-18 DIAGNOSIS — K219 Gastro-esophageal reflux disease without esophagitis: Secondary | ICD-10-CM

## 2023-07-18 DIAGNOSIS — R1310 Dysphagia, unspecified: Secondary | ICD-10-CM

## 2023-07-18 DIAGNOSIS — R053 Chronic cough: Secondary | ICD-10-CM

## 2023-08-21 ENCOUNTER — Other Ambulatory Visit: Payer: Self-pay

## 2023-08-21 ENCOUNTER — Emergency Department
Admission: EM | Admit: 2023-08-21 | Discharge: 2023-08-21 | Disposition: A | Discharge status: Home / Self Care | Attending: Emergency Medicine | Admitting: Emergency Medicine

## 2023-08-21 DIAGNOSIS — K2900 Acute gastritis without bleeding: Secondary | ICD-10-CM | POA: Insufficient documentation

## 2023-08-21 DIAGNOSIS — R1013 Epigastric pain: Secondary | ICD-10-CM | POA: Diagnosis present

## 2023-08-21 DIAGNOSIS — E1165 Type 2 diabetes mellitus with hyperglycemia: Secondary | ICD-10-CM | POA: Diagnosis not present

## 2023-08-21 HISTORY — DX: Disorder of kidney and ureter, unspecified: N28.9

## 2023-08-21 HISTORY — DX: Atherosclerotic heart disease of native coronary artery without angina pectoris: I25.10

## 2023-08-21 LAB — COMPREHENSIVE METABOLIC PANEL WITH GFR
ALT: 25 U/L (ref 0–44)
AST: 23 U/L (ref 15–41)
Albumin: 4.1 g/dL (ref 3.5–5.0)
Alkaline Phosphatase: 76 U/L (ref 38–126)
Anion gap: 14 (ref 5–15)
BUN: 16 mg/dL (ref 6–20)
CO2: 24 mmol/L (ref 22–32)
Calcium: 9.5 mg/dL (ref 8.9–10.3)
Chloride: 104 mmol/L (ref 98–111)
Creatinine, Ser: 0.83 mg/dL (ref 0.44–1.00)
GFR, Estimated: >60 mL/min (ref >60)
Glucose, Bld: 151 mg/dL — ABNORMAL HIGH (ref 70–99)
Potassium: 4 mmol/L (ref 3.5–5.1)
Sodium: 142 mmol/L (ref 135–145)
Total Bilirubin: 0.7 mg/dL (ref 0.0–1.2)
Total Protein: 7.2 g/dL (ref 6.5–8.1)

## 2023-08-21 LAB — CBC
HCT: 44.7 % (ref 36.0–46.0)
Hemoglobin: 14.2 g/dL (ref 12.0–15.0)
MCH: 26.4 pg (ref 26.0–34.0)
MCHC: 31.8 g/dL (ref 30.0–36.0)
MCV: 83.2 fL (ref 80.0–100.0)
Platelets: 243 K/uL (ref 150–400)
RBC: 5.37 MIL/uL — ABNORMAL HIGH (ref 3.87–5.11)
RDW: 13.9 % (ref 11.5–15.5)
WBC: 9.2 K/uL (ref 4.0–10.5)
nRBC: 0 % (ref 0.0–0.2)

## 2023-08-21 LAB — URINALYSIS, ROUTINE W REFLEX MICROSCOPIC
Bilirubin Urine: NEGATIVE
Glucose, UA: >=500 mg/dL — AB
Hgb urine dipstick: NEGATIVE
Ketones, ur: NEGATIVE mg/dL
Leukocytes,Ua: NEGATIVE
Nitrite: NEGATIVE
Protein, ur: NEGATIVE mg/dL
RBC / HPF: 0 RBC/hpf (ref 0–5)
Specific Gravity, Urine: 1.014 (ref 1.005–1.030)
pH: 5 (ref 5.0–8.0)

## 2023-08-21 LAB — CBG MONITORING, ED
Glucose-Capillary: 155 mg/dL — ABNORMAL HIGH (ref 70–99)
Glucose-Capillary: 95 mg/dL (ref 70–99)
Glucose-Capillary: 130 mg/dL — ABNORMAL HIGH (ref 70–99)

## 2023-08-21 LAB — LIPASE, BLOOD: Lipase: 45 U/L (ref 11–51)

## 2023-08-21 MED ORDER — ALUM & MAG HYDROXIDE-SIMETH 200-200-20 MG/5ML PO SUSP
30.0000 mL | Freq: Once | ORAL | Status: AC
  Administered 2023-08-21: 30 mL via ORAL
  Filled 2023-08-21: qty 30

## 2023-08-21 MED ORDER — LANCETS MISC. MISC: 1.0000 | Freq: Three times a day (TID) | 0 refills | Status: AC

## 2023-08-21 MED ORDER — ONDANSETRON 4 MG PO TBDP
4.0000 mg | ORAL_TABLET | Freq: Once | ORAL | Status: AC
  Administered 2023-08-21: 4 mg via ORAL
  Filled 2023-08-21: qty 1

## 2023-08-21 MED ORDER — BLOOD GLUCOSE TEST VI STRP: 1.0000 | ORAL_STRIP | Freq: Three times a day (TID) | 0 refills | Status: AC

## 2023-08-21 MED ORDER — BLOOD GLUCOSE MONITORING SUPPL DEVI: 1.0000 | Freq: Three times a day (TID) | 0 refills | Status: AC

## 2023-08-21 MED ORDER — LANCET DEVICE MISC: 1.0000 | Freq: Three times a day (TID) | 0 refills | Status: AC

## 2023-08-21 MED ORDER — LIDOCAINE VISCOUS HCL 2 % MT SOLN
15.0000 mL | Freq: Once | OROMUCOSAL | Status: AC
  Administered 2023-08-21: 15 mL via ORAL
  Filled 2023-08-21: qty 15

## 2023-08-21 NOTE — ED Provider Notes (Signed)
 [facility] Provider Note    Event Date/Time   First MD Initiated Contact with Patient 08/21/23 2030     (approximate)   History   Abdominal Pain and Hypoglycemia   HPI  Annette Norman is a 58 y.o. female with a history of diabetes who presents with complaints of concerns for hyperglycemia.  She reports her glucose monitor has been alarming for low glucose.  She also reports mild epigastric discomfort which comes and goes.  No chest pain, no fevers chills, cough or shortness of breath.     Physical Exam   Triage Vital Signs: ED Triage Vitals  Encounter Vitals Group     BP 08/21/23 1651 131/78     Girls Systolic BP Percentile --      Girls Diastolic BP Percentile --      Boys Systolic BP Percentile --      Boys Diastolic BP Percentile --      Pulse Rate 08/21/23 1651 94     Resp 08/21/23 1651 20     Temp 08/21/23 1651 98.4 F (36.9 C)     Temp Source 08/21/23 1651 Oral     SpO2 08/21/23 1651 100 %     Weight 08/21/23 1654 74.4 kg (164 lb)     Height 08/21/23 1654 1.6 m (5' 3)     Head Circumference --      Peak Flow --      Pain Score 08/21/23 1652 8     Pain Loc --      Pain Education --      Exclude from Growth Chart --     Most recent vital signs: Vitals:   08/21/23 2040 08/21/23 2140  BP: 127/70 101/89  Pulse: 92 94  Resp: 20 18  Temp: 98.8 F (37.1 C)   SpO2: 100% 98%     General: Awake, no distress.  CV:  Good peripheral perfusion.  Resp:  Normal effort.  Abd:  No distention.  Soft, nontender, reassuring exam Other:     ED Results / Procedures / Treatments   Labs (all labs ordered are listed, but only abnormal results are displayed) Labs Reviewed  COMPREHENSIVE METABOLIC PANEL WITH GFR - Abnormal; Notable for the following components:      Result Value   Glucose, Bld 151 (*)    All other components within normal limits  CBC - Abnormal; Notable for the following components:   RBC 5.37 (*)    All other  components within normal limits  URINALYSIS, ROUTINE W REFLEX MICROSCOPIC - Abnormal; Notable for the following components:   Color, Urine STRAW (*)    APPearance CLEAR (*)    Glucose, UA >=500 (*)    Bacteria, UA RARE (*)    All other components within normal limits  CBG MONITORING, ED - Abnormal; Notable for the following components:   Glucose-Capillary 155 (*)    All other components within normal limits  CBG MONITORING, ED - Abnormal; Notable for the following components:   Glucose-Capillary 130 (*)    All other components within normal limits  LIPASE, BLOOD  CBG MONITORING, ED     EKG  ED ECG REPORT I, Lamar Price, the attending physician, personally viewed and interpreted this ECG.  Date: 08/21/2023  Rhythm: normal sinus rhythm QRS Axis: normal Intervals: normal ST/T Wave abnormalities: normal Narrative Interpretation: no evidence of acute ischemia    RADIOLOGY     PROCEDURES:  Critical Care performed:   Procedures  MEDICATIONS ORDERED IN ED: Medications  alum & mag hydroxide-simeth (MAALOX/MYLANTA) 200-200-20 MG/5ML suspension 30 mL (30 mLs Oral Given 08/21/23 2055)    And  lidocaine  (XYLOCAINE ) 2 % viscous mouth solution 15 mL (15 mLs Oral Given 08/21/23 2055)  ondansetron  (ZOFRAN -ODT) disintegrating tablet 4 mg (4 mg Oral Given 08/21/23 2049)     IMPRESSION / MDM / ASSESSMENT AND PLAN / ED COURSE  I reviewed the triage vital signs and the nursing notes. Patient's presentation is most consistent with acute presentation with potential threat to life or bodily function.  Patient presents with possible hypoglycemia.  Her monitor has been alarming and has gotten as low as 55.  We did compare her monitor to CBG here in the emergency department and found that her monitor seem to be about 20-30 points lower each time so I think this is a false hypoglycemia  Patient's labs are overall reassuring her glucose is 151 on her chemistries.  Her lab work is  otherwise unremarkable.  Patient observed in the emergency department, no episodes of hypoglycemia, felt better after GI cocktail suspect mild gastritis, appropriate for discharge with close outpatient follow-up with PCP for her likely malfunctioning glucose monitor        FINAL CLINICAL IMPRESSION(S) / ED DIAGNOSES   Final diagnoses:  Acute gastritis without hemorrhage, unspecified gastritis type     Rx / DC Orders   ED Discharge Orders          Ordered    Blood Glucose Monitoring Suppl DEVI  3 times daily        08/21/23 2204    Glucose Blood (BLOOD GLUCOSE TEST STRIPS) STRP  3 times daily        08/21/23 2204    Lancet Device MISC  3 times daily        08/21/23 2204    Lancets Misc. MISC  3 times daily        08/21/23 2204             Note:  This document was prepared using Dragon voice recognition software and may include unintentional dictation errors.   Arlander Charleston, MD 08/21/23 [phone]

## 2023-08-21 NOTE — ED Notes (Signed)
 Pt completely assessed by EDP and set to discharge. No RN assessment performed.  Pt provided discharge instructions and prescription information. Pt was given the opportunity to ask questions and questions were answered.

## 2023-08-21 NOTE — ED Notes (Signed)
 Patient ambulatory to triage with steady gait, without difficulty or distress noted; pt has own glucose meter with her that is currently reading 53; ED glucometer used to recheck and indicates FSBS 95; pt given 8oz OJ to drink

## 2023-08-21 NOTE — ED Triage Notes (Signed)
 Pt to ED for dizziness, low blood sugar (57), nausea, HA and upper midline abdominal pain since this AM. Pt is diabetic type 2. Pt has been eating and drinking since this AM. LBM today, normal. Denies dysuria. Pt states she also fell yesterday in shower because had syncopal episode. States hit head. Not on thinners. CBG is 155. Pt is blind to L eye.

## 2023-09-24 ENCOUNTER — Emergency Department
Admission: EM | Admit: 2023-09-24 | Discharge: 2023-09-24 | Disposition: A | Discharge status: Home / Self Care | Attending: Emergency Medicine | Admitting: Emergency Medicine

## 2023-09-24 ENCOUNTER — Emergency Department

## 2023-09-24 ENCOUNTER — Other Ambulatory Visit: Payer: Self-pay

## 2023-09-24 DIAGNOSIS — R1011 Right upper quadrant pain: Secondary | ICD-10-CM | POA: Diagnosis not present

## 2023-09-24 DIAGNOSIS — R079 Chest pain, unspecified: Secondary | ICD-10-CM

## 2023-09-24 DIAGNOSIS — R110 Nausea: Secondary | ICD-10-CM | POA: Diagnosis not present

## 2023-09-24 DIAGNOSIS — E119 Type 2 diabetes mellitus without complications: Secondary | ICD-10-CM | POA: Diagnosis not present

## 2023-09-24 DIAGNOSIS — R1013 Epigastric pain: Secondary | ICD-10-CM | POA: Insufficient documentation

## 2023-09-24 DIAGNOSIS — I2510 Atherosclerotic heart disease of native coronary artery without angina pectoris: Secondary | ICD-10-CM | POA: Insufficient documentation

## 2023-09-24 DIAGNOSIS — R109 Unspecified abdominal pain: Secondary | ICD-10-CM

## 2023-09-24 DIAGNOSIS — R1031 Right lower quadrant pain: Secondary | ICD-10-CM | POA: Insufficient documentation

## 2023-09-24 DIAGNOSIS — R911 Solitary pulmonary nodule: Secondary | ICD-10-CM | POA: Insufficient documentation

## 2023-09-24 DIAGNOSIS — J449 Chronic obstructive pulmonary disease, unspecified: Secondary | ICD-10-CM | POA: Insufficient documentation

## 2023-09-24 DIAGNOSIS — R072 Precordial pain: Secondary | ICD-10-CM | POA: Insufficient documentation

## 2023-09-24 LAB — COMPREHENSIVE METABOLIC PANEL WITH GFR
ALT: 23 U/L (ref 0–44)
AST: 21 U/L (ref 15–41)
Albumin: 4.3 g/dL (ref 3.5–5.0)
Alkaline Phosphatase: 74 U/L (ref 38–126)
Anion gap: 10 (ref 5–15)
BUN: 12 mg/dL (ref 6–20)
CO2: 25 mmol/L (ref 22–32)
Calcium: 9.2 mg/dL (ref 8.9–10.3)
Chloride: 103 mmol/L (ref 98–111)
Creatinine, Ser: 0.72 mg/dL (ref 0.44–1.00)
GFR, Estimated: >60 mL/min (ref >60)
Glucose, Bld: 187 mg/dL — ABNORMAL HIGH (ref 70–99)
Potassium: 4.1 mmol/L (ref 3.5–5.1)
Sodium: 138 mmol/L (ref 135–145)
Total Bilirubin: 0.7 mg/dL (ref 0.0–1.2)
Total Protein: 7.6 g/dL (ref 6.5–8.1)

## 2023-09-24 LAB — URINALYSIS, ROUTINE W REFLEX MICROSCOPIC
Bilirubin Urine: NEGATIVE
Glucose, UA: >=500 mg/dL — AB
Hgb urine dipstick: NEGATIVE
Ketones, ur: NEGATIVE mg/dL
Leukocytes,Ua: NEGATIVE
Nitrite: NEGATIVE
Protein, ur: NEGATIVE mg/dL
RBC / HPF: 0 RBC/hpf (ref 0–5)
Specific Gravity, Urine: 1.024 (ref 1.005–1.030)
pH: 6 (ref 5.0–8.0)

## 2023-09-24 LAB — LIPASE, BLOOD: Lipase: 44 U/L (ref 11–51)

## 2023-09-24 LAB — CBC
HCT: 45.2 % (ref 36.0–46.0)
Hemoglobin: 14.5 g/dL (ref 12.0–15.0)
MCH: 26.8 pg (ref 26.0–34.0)
MCHC: 32.1 g/dL (ref 30.0–36.0)
MCV: 83.5 fL (ref 80.0–100.0)
Platelets: 252 K/uL (ref 150–400)
RBC: 5.41 MIL/uL — ABNORMAL HIGH (ref 3.87–5.11)
RDW: 13.9 % (ref 11.5–15.5)
WBC: 8.5 K/uL (ref 4.0–10.5)
nRBC: 0 % (ref 0.0–0.2)

## 2023-09-24 LAB — TROPONIN I (HIGH SENSITIVITY): Troponin I (High Sensitivity): 4 ng/L (ref <18)

## 2023-09-24 MED ORDER — ALUM & MAG HYDROXIDE-SIMETH 200-200-20 MG/5ML PO SUSP
30.0000 mL | Freq: Once | ORAL | Status: AC
  Administered 2023-09-24: 30 mL via ORAL
  Filled 2023-09-24: qty 30

## 2023-09-24 MED ORDER — DICYCLOMINE HCL 10 MG PO CAPS
10.0000 mg | ORAL_CAPSULE | Freq: Once | ORAL | Status: AC
  Administered 2023-09-24: 10 mg via ORAL
  Filled 2023-09-24: qty 1

## 2023-09-24 MED ORDER — SUCRALFATE 1 G PO TABS: 1.0000 g | ORAL_TABLET | Freq: Three times a day (TID) | ORAL | 0 refills | Status: AC | PRN

## 2023-09-24 MED ORDER — IOHEXOL 300 MG/ML  SOLN
100.0000 mL | Freq: Once | INTRAMUSCULAR | Status: AC | PRN
  Administered 2023-09-24: 100 mL via INTRAVENOUS

## 2023-09-24 MED ORDER — LIDOCAINE VISCOUS HCL 2 % MT SOLN
15.0000 mL | Freq: Once | OROMUCOSAL | Status: AC
  Administered 2023-09-24: 15 mL via ORAL
  Filled 2023-09-24: qty 15

## 2023-09-24 MED ORDER — MORPHINE SULFATE (PF) 2 MG/ML IV SOLN
2.0000 mg | Freq: Once | INTRAVENOUS | Status: AC
  Administered 2023-09-24: 2 mg via INTRAVENOUS
  Filled 2023-09-24: qty 1

## 2023-09-24 MED ORDER — SODIUM CHLORIDE 0.9 % IV BOLUS
1000.0000 mL | Freq: Once | INTRAVENOUS | Status: AC
  Administered 2023-09-24: 1000 mL via INTRAVENOUS

## 2023-09-24 NOTE — ED Notes (Signed)
 This RN attempted x2 to start IV without success. RN Rosina attempted as well without success.

## 2023-09-24 NOTE — Discharge Instructions (Signed)
 Please make sure to follow-up with your primary care doctor for further management of your symptoms as well as the incidental finding of a lung nodule noted on your CAT scan.

## 2023-09-24 NOTE — ED Notes (Signed)
 Pt returned from CT

## 2023-09-24 NOTE — ED Provider Notes (Signed)
 Annette Norman Provider Note    Event Date/Time   First MD Initiated Contact with Patient 09/24/23 1226     (approximate)   History   Abdominal Pain   HPI  Annette Norman is a 58 y.o. 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  States has been ongoing for the last 3 days, associate with nausea.  States that she had a very small bowel movement yesterday, is passing gas.  Also noted that some midsternal chest pain, no shortness of breath or fever, no cough.  No leg swelling.    On independent chart review, she was seen by primary care in July, was there for diabetes follow-up, uses an OP-pump.  Was on oral medications in the past but that caused GI upset.   Physical Exam   Triage Vital Signs: ED Triage Vitals  Encounter Vitals Group     BP 09/24/23 1219 139/70     Girls Systolic BP Percentile --      Girls Diastolic BP Percentile --      Boys Systolic BP Percentile --      Boys Diastolic BP Percentile --      Pulse Rate 09/24/23 1219 98     Resp 09/24/23 1219 16     Temp 09/24/23 1219 98.3 F (36.8 C)     Temp Source 09/24/23 1219 Oral     SpO2 09/24/23 1219 100 %     Weight 09/24/23 1220 171 lb (77.6 kg)     Height 09/24/23 1220 5' 3 (1.6 m)     Head Circumference --      Peak Flow --      Pain Score 09/24/23 1220 10     Pain Loc --      Pain Education --      Exclude from Growth Chart --     Most recent vital signs: Vitals:   09/24/23 1230 09/24/23 1330  BP: 126/69 139/73  Pulse: 98 (!) 109  Resp:  18  Temp:    SpO2: 100% 100%     General: Awake, no distress.  CV:  Good peripheral perfusion.  Resp:  Normal effort.  No tachypnea or respiratory distress Abd:  No distention.  Soft, tender to the epigastric, right upper quadrant, right lower quadrant without guarding. Other:  No CVA tenderness bilaterally, no flank tenderness bilaterally, no obvious  rash, no lower extremity edema or unilateral calf swelling or tenderness.  Equal DP pulses bilaterally, no focal weakness or numbness.   ED Results / Procedures / Treatments   Labs (all labs ordered are listed, but only abnormal results are displayed) Labs Reviewed  COMPREHENSIVE METABOLIC PANEL WITH GFR - Abnormal; Notable for the following components:      Result Value   Glucose, Bld 187 (*)    All other components within normal limits  CBC - Abnormal; Notable for the following components:   RBC 5.41 (*)    All other components within normal limits  URINALYSIS, ROUTINE W REFLEX MICROSCOPIC - Abnormal; Notable for the following components:   Color, Urine YELLOW (*)    APPearance CLEAR (*)    Glucose, UA >=500 (*)    Bacteria, UA RARE (*)    All other components within normal limits  LIPASE, BLOOD  TROPONIN I (HIGH SENSITIVITY)     EKG  EKG shows, sinus tachycardia, rate 102, normal QS, normal QTc, no obvious ischemic ST elevation,  not significant change compared to prior   RADIOLOGY On my independent interpretation, CT without obvious hydronephrosis   PROCEDURES:  Critical Care performed: No  Procedures   MEDICATIONS ORDERED IN ED: Medications  sodium chloride  0.9 % bolus 1,000 mL (1,000 mLs Intravenous New Bag/Given 09/24/23 1353)  morphine  (PF) 2 MG/ML injection 2 mg (2 mg Intravenous Given 09/24/23 1353)  iohexol  (OMNIPAQUE ) 300 MG/ML solution 100 mL (100 mLs Intravenous Contrast Given 09/24/23 1406)  alum & mag hydroxide-simeth (MAALOX/MYLANTA) 200-200-20 MG/5ML suspension 30 mL (30 mLs Oral Given 09/24/23 1456)    And  lidocaine  (XYLOCAINE ) 2 % viscous mouth solution 15 mL (15 mLs Oral Given 09/24/23 1456)  dicyclomine  (BENTYL ) capsule 10 mg (10 mg Oral Given 09/24/23 1457)     IMPRESSION / MDM / ASSESSMENT AND PLAN / ED COURSE  I reviewed the triage vital signs and the nursing notes.                              Differential diagnosis includes, but is not  limited to, colitis, diverticulitis, viral illness, UTI, kidney stone, gastritis, GERD, choledocholithiasis, ACS.  Considered aortic etiology but she has no tearing sharp pain that goes to her back, not overtly hypertensive, she is equal DP pulses bilaterally without any weakness or numbness.  Will get labs, EKG, troponin, chest x-ray, CT abdomen pelvis, will give her some IV fluids here as well as IV pain meds.  Patient's presentation is most consistent with acute presentation with potential threat to life or bodily function.  Independent interpretation of labs and imaging below.  Clinical course as below.  On reassessment symptoms are improving with a GI cocktail and Bentyl .  Discussed with her about following up outpatient with GI as well as a primary care doctor for further management of her symptoms.  States she is already on the Bentyl  and Pepcid , will add sucralfate .  Will send the medications to her pharmacy.  Considered but no indication for inpatient admission at this time, she safe for outpatient management.  Will discharge with strict return precautions.  Shared decision making to patient and she is agreeable with this plan.  The patient is on the cardiac monitor to evaluate for evidence of arrhythmia and/or significant heart rate changes.   Clinical Course as of 09/24/23 1516  Sun Sep 24, 2023  1253 Independent review of labs, electrolytes not severely deranged, LFTs are normal, no leukocytosis, lipase is normal. [TT]  1420 Urinalysis, Routine w reflex microscopic -Urine, Clean Catch(!) UA shows rare bacteria but no leukocytes or nitrites. [TT]  1420 DG Chest 1 View No acute findings.  [TT]  1433 CT ABDOMEN PELVIS W CONTRAST IMPRESSION: 1. No acute findings in the abdomen or pelvis related to the clinical history of RLQ abdominal pain. 2. 7 mm nodule in the right lung base, increased since the comparison. Recommend 3 months follow up CT chest without contrast to evaluate for  any change or other lesions got   [TT]  1442 Discussed with patient about imaging and lab results including incidental findings, instructed her to follow-up with her primary care doctor for further management of the lung nodule.  She is still having some abdominal pain although now her abdomen is soft nontender, will give her some Bentyl  as well as a GI cocktail. [TT]    Clinical Course User Index [TT] Waymond Lorelle Cummins, MD     FINAL CLINICAL IMPRESSION(S) / ED DIAGNOSES  Final diagnoses:  Abdominal pain, unspecified abdominal location  Lung nodule  Chest pain, unspecified type     Rx / DC Orders   ED Discharge Orders          Ordered    sucralfate  (CARAFATE ) 1 g tablet  Every 8 hours PRN        09/24/23 1455             Note:  This document was prepared using Dragon voice recognition software and may include unintentional dictation errors.    Waymond Lorelle Cummins, MD 09/24/23 [phone]

## 2023-09-24 NOTE — ED Notes (Signed)
 Pt taken to CT.

## 2023-09-24 NOTE — ED Triage Notes (Signed)
 Pt reports abd pain x3 days with nausea, no diarrhea. Pt is diabetic with an insulin  pump and states her glucose has been in normal range. No other med hx

## 2023-10-12 ENCOUNTER — Other Ambulatory Visit

## 2023-10-12 ENCOUNTER — Ambulatory Visit
Admission: RE | Admit: 2023-10-12 | Discharge: 2023-10-12 | Disposition: A | Discharge status: Home / Self Care | Source: Ambulatory Visit | Attending: Physician Assistant | Admitting: Physician Assistant

## 2023-10-12 DIAGNOSIS — R1310 Dysphagia, unspecified: Secondary | ICD-10-CM

## 2023-10-12 DIAGNOSIS — R053 Chronic cough: Secondary | ICD-10-CM

## 2023-10-12 DIAGNOSIS — K219 Gastro-esophageal reflux disease without esophagitis: Secondary | ICD-10-CM
